# Patient Record
Sex: Female | Born: 1995 | Race: Black or African American | Hispanic: No | Marital: Single | Smoking: Never smoker
Health system: Southern US, Community
[De-identification: ages and names within clinical notes are randomized; demographics above are authoritative.]

## PROBLEM LIST (undated history)

## (undated) DIAGNOSIS — J45909 Unspecified asthma, uncomplicated: Secondary | ICD-10-CM

## (undated) DIAGNOSIS — C8190 Hodgkin lymphoma, unspecified, unspecified site: Secondary | ICD-10-CM

## (undated) DIAGNOSIS — R590 Localized enlarged lymph nodes: Secondary | ICD-10-CM

## (undated) DIAGNOSIS — D649 Anemia, unspecified: Secondary | ICD-10-CM

## (undated) HISTORY — DX: Hodgkin lymphoma, unspecified, unspecified site: C81.90

## (undated) HISTORY — PX: WISDOM TOOTH EXTRACTION: SHX21

---
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
## 2018-10-05 ENCOUNTER — Ambulatory Visit (INDEPENDENT_AMBULATORY_CARE_PROVIDER_SITE_OTHER)
Admission: EM | Admit: 2018-10-05 | Discharge: 2018-10-05 | Disposition: A | Discharge status: Home / Self Care | Payer: 59 | Source: Home / Self Care

## 2018-10-05 ENCOUNTER — Inpatient Hospital Stay (HOSPITAL_COMMUNITY)
Admission: AD | Admit: 2018-10-05 | Discharge: 2018-10-05 | Disposition: A | Discharge status: Home / Self Care | Payer: 59 | Attending: Obstetrics & Gynecology | Admitting: Obstetrics & Gynecology

## 2018-10-05 ENCOUNTER — Other Ambulatory Visit: Payer: Self-pay

## 2018-10-05 ENCOUNTER — Encounter (HOSPITAL_COMMUNITY): Payer: Self-pay

## 2018-10-05 ENCOUNTER — Inpatient Hospital Stay (HOSPITAL_COMMUNITY): Payer: 59

## 2018-10-05 DIAGNOSIS — O26891 Other specified pregnancy related conditions, first trimester: Secondary | ICD-10-CM

## 2018-10-05 DIAGNOSIS — D649 Anemia, unspecified: Secondary | ICD-10-CM | POA: Diagnosis not present

## 2018-10-05 DIAGNOSIS — O99011 Anemia complicating pregnancy, first trimester: Secondary | ICD-10-CM | POA: Diagnosis not present

## 2018-10-05 DIAGNOSIS — Z3A01 Less than 8 weeks gestation of pregnancy: Secondary | ICD-10-CM | POA: Insufficient documentation

## 2018-10-05 DIAGNOSIS — R8281 Pyuria: Secondary | ICD-10-CM

## 2018-10-05 DIAGNOSIS — J45909 Unspecified asthma, uncomplicated: Secondary | ICD-10-CM | POA: Diagnosis not present

## 2018-10-05 DIAGNOSIS — O99511 Diseases of the respiratory system complicating pregnancy, first trimester: Secondary | ICD-10-CM | POA: Diagnosis not present

## 2018-10-05 DIAGNOSIS — O209 Hemorrhage in early pregnancy, unspecified: Secondary | ICD-10-CM | POA: Insufficient documentation

## 2018-10-05 DIAGNOSIS — Z3401 Encounter for supervision of normal first pregnancy, first trimester: Secondary | ICD-10-CM | POA: Insufficient documentation

## 2018-10-05 DIAGNOSIS — N939 Abnormal uterine and vaginal bleeding, unspecified: Secondary | ICD-10-CM

## 2018-10-05 DIAGNOSIS — R109 Unspecified abdominal pain: Secondary | ICD-10-CM | POA: Insufficient documentation

## 2018-10-05 DIAGNOSIS — O26899 Other specified pregnancy related conditions, unspecified trimester: Secondary | ICD-10-CM

## 2018-10-05 LAB — WET PREP, GENITAL
Sperm: NONE SEEN
Trich, Wet Prep: NONE SEEN
Yeast Wet Prep HPF POC: NONE SEEN

## 2018-10-05 LAB — POCT URINALYSIS DIP (MANUAL ENTRY)
Bilirubin, UA: NEGATIVE
Glucose, UA: NEGATIVE mg/dL
Ketones, POC UA: NEGATIVE mg/dL
Nitrite, UA: NEGATIVE
Protein Ur, POC: NEGATIVE mg/dL
Spec Grav, UA: >=1.03 — AB (ref 1.010–1.025)
Urobilinogen, UA: 0.2 E.U./dL
pH, UA: 5.5 (ref 5.0–8.0)

## 2018-10-05 LAB — CBC
HCT: 29.3 % — ABNORMAL LOW (ref 36.0–46.0)
Hemoglobin: 8.1 g/dL — ABNORMAL LOW (ref 12.0–15.0)
MCH: 17.2 pg — ABNORMAL LOW (ref 26.0–34.0)
MCHC: 27.6 g/dL — ABNORMAL LOW (ref 30.0–36.0)
MCV: 62.2 fL — ABNORMAL LOW (ref 80.0–100.0)
Platelets: 387 10*3/uL (ref 150–400)
RBC: 4.71 MIL/uL (ref 3.87–5.11)
RDW: 19.3 % — ABNORMAL HIGH (ref 11.5–15.5)
WBC: 5.2 10*3/uL (ref 4.0–10.5)
nRBC: 0 % (ref 0.0–0.2)

## 2018-10-05 LAB — ABO/RH: ABO/RH(D): A POS

## 2018-10-05 LAB — POCT URINE PREGNANCY: Preg Test, Ur: POSITIVE — AB

## 2018-10-05 LAB — HCG, QUANTITATIVE, PREGNANCY: hCG, Beta Chain, Quant, S: 13226 m[IU]/mL — ABNORMAL HIGH (ref <5)

## 2018-10-05 MED ORDER — PRENATAL VITAMIN 27-0.8 MG PO TABS: 1.0000 | ORAL_TABLET | Freq: Every day | ORAL | 3 refills | Status: DC

## 2018-10-05 MED ORDER — POLYSACCHARIDE IRON COMPLEX 150 MG PO CAPS: 150.0000 mg | ORAL_CAPSULE | Freq: Two times a day (BID) | ORAL | 1 refills | Status: DC

## 2018-10-05 MED ORDER — CEPHALEXIN 500 MG PO CAPS: 500.0000 mg | ORAL_CAPSULE | Freq: Three times a day (TID) | ORAL | 0 refills | Status: DC

## 2018-10-05 NOTE — MAU Provider Note (Signed)
History     CSN: [id number]  Arrival date and time: 10/05/18 1426   First Provider Initiated Contact with Patient 10/05/18 1457      Chief Complaint  Patient presents with  . Abdominal Pain  . Vaginal Bleeding   G1 @[redacted]w[redacted]d  presenting with LAP. Pain started 3 days ago. Describes as intermittent and sharp. Pain is worse in LLQ. Pain improves with sitting, worsens with standing. Rates 5/10. Has not taken anything for it. Reports 2 days of vaginal spotting. Denies urinary sx. No fevers. No GI sx.   OB History    Gravida  1   Para      Term      Preterm      AB      Living        SAB      TAB      Ectopic      Multiple      Live Births              Past Medical History:  Diagnosis Date  . Asthma     No past surgical history on file.  No family history on file.  Social History   Tobacco Use  . Smoking status: Never Smoker  . Smokeless tobacco: Never Used  Substance Use Topics  . Alcohol use: No  . Drug use: No    Allergies:  Allergies  Allergen Reactions  . Fruit & Vegetable Daily [Nutritional Supplements] Swelling  . Peanut-Containing Drug Products Swelling    Medications Prior to Admission  Medication Sig Dispense Refill Last Dose  . cephALEXin (KEFLEX) 500 MG capsule Take 1 capsule (500 mg total) by mouth 3 (three) times daily. 20 capsule 0   . ibuprofen (ADVIL,MOTRIN) 600 MG tablet Take 1 tablet (600 mg total) by mouth every 6 (six) hours as needed. 30 tablet 0   . Prenatal Vit-Fe Fumarate-FA (PRENATAL VITAMIN) 27-0.8 MG TABS Take 1 tablet by mouth daily. 90 tablet 3     Review of Systems  Constitutional: Negative for chills and fever.  Gastrointestinal: Positive for abdominal pain. Negative for constipation, diarrhea, nausea and vomiting.  Genitourinary: Positive for vaginal bleeding. Negative for dysuria, frequency, urgency and vaginal discharge.   Physical Exam   Blood pressure 114/75, pulse 86, temperature 98.9 F (37.2 C),  temperature source Oral, resp. rate 16, height 5\' 3"  (1.6 m), weight 45.8 kg, last menstrual period 08/20/2018, SpO2 100 %.  Physical Exam  Nursing note and vitals reviewed. Constitutional: She is oriented to person, place, and time. She appears well-developed and well-nourished. No distress.  HENT:  Head: Normocephalic and atraumatic.  Neck: Normal range of motion.  Cardiovascular: Normal rate.  Respiratory: Effort normal. No respiratory distress.  GI: Soft. She exhibits no distension and no mass. There is no abdominal tenderness. There is no rebound and no guarding.  Genitourinary:    Genitourinary Comments: deferred   Musculoskeletal: Normal range of motion.  Neurological: She is alert and oriented to person, place, and time.  Skin: Skin is warm and dry.  Psychiatric: She has a normal mood and affect.   Results for orders placed or performed during the hospital encounter of 10/05/18 (from the past 24 hour(s))  ABO/Rh     Status: None   Collection Time: 10/05/18  2:50 PM  Result Value Ref Range   ABO/RH(D) A POS    No rh immune globuloin      NOT A RH IMMUNE GLOBULIN CANDIDATE, PT RH POSITIVE Performed  at [facility], [place] [address], [place], [place] [address]   CBC     Status: Abnormal   Collection Time: 10/05/18  2:50 PM  Result Value Ref Range   WBC 5.2 4.0 - 10.5 K/uL   RBC 4.71 3.87 - 5.11 MIL/uL   Hemoglobin 8.1 (L) 12.0 - 15.0 g/dL   HCT 29.3 (L) 36.0 - 46.0 %   MCV 62.2 (L) 80.0 - 100.0 fL   MCH 17.2 (L) 26.0 - 34.0 pg   MCHC 27.6 (L) 30.0 - 36.0 g/dL   RDW 19.3 (H) 11.5 - 15.5 %   Platelets 387 150 - 400 K/uL   nRBC 0.0 0.0 - 0.2 %  hCG, quantitative, pregnancy     Status: Abnormal   Collection Time: 10/05/18  2:50 PM  Result Value Ref Range   hCG, Beta Chain, Quant, S 13,226 (H) <5 mIU/mL  Wet prep, genital     Status: Abnormal   Collection Time: 10/05/18  3:07 PM  Result Value Ref Range   Yeast Wet Prep HPF POC NONE SEEN NONE SEEN   Trich, Wet Prep  NONE SEEN NONE SEEN   Clue Cells Wet Prep HPF POC PRESENT (A) NONE SEEN   WBC, Wet Prep HPF POC FEW (A) NONE SEEN   Sperm NONE SEEN    US Ob Less Than 14 Weeks With Ob Transvaginal  Result Date: 10/05/2018 CLINICAL DATA:  Pelvic pain EXAM: OBSTETRIC <14 WK [place] AND TRANSVAGINAL OB US TECHNIQUE: Both transabdominal and transvaginal ultrasound examinations were performed for complete evaluation of the gestation as well as the maternal uterus, adnexal regions, and pelvic cul-de-sac. Transvaginal technique was performed to assess early pregnancy. COMPARISON:  None. FINDINGS: Intrauterine gestational sac: Visualized Yolk sac:  Visualized Embryo:  Visualized Cardiac Activity: Visualized Heart Rate: 171 bpm CRL:  2 mm   5 w   5 d                  [place] EDC: June 02, 2019 Subchorionic hemorrhage:  None visualized. Maternal uterus/adnexae: Cervical os is closed. Left ovary measures 2.2 x 1.6 x 2.0 cm. Right ovary measures 3.2 x 2.3 x 2.4 cm. Small corpus luteum noted on the right, a physiologic finding. No free pelvic fluid. IMPRESSION: Single live intrauterine gestation with estimated gestational age of 6-weeks. Study otherwise unremarkable. Electronically Signed   By: Lowella Grip III M.D.   On: 10/05/2018 15:38   MAU Course  Procedures Orders Placed This Encounter  Procedures  . Wet prep, genital    Standing Status:   Standing    Number of Occurrences:   1    Order Specific Question:   Patient immune status    Answer:   Normal  . US OB LESS THAN 14 WEEKS WITH OB TRANSVAGINAL    Standing Status:   Standing    Number of Occurrences:   1    Order Specific Question:   Symptom/Reason for Exam    Answer:   Abdominal pain affecting pregnancy [[id number]]  . CBC    ABO ordered    Standing Status:   Standing    Number of Occurrences:   1  . hCG, quantitative, pregnancy    Standing Status:   Standing    Number of Occurrences:   1  . ABO/Rh    Standing Status:   Standing    Number of Occurrences:   1   . Discharge patient    Order Specific Question:  Discharge disposition    Answer:   01-Home or Self Care [1]    Order Specific Question:   Discharge patient date    Answer:   10/05/2018   MDM Labs and [place] ordered and reviewed. Viable IUP on [place]. Will treat anemia. Admits to ice PICA, encouraged cessation. Stable for discharge home.   Assessment and Plan   1. [redacted] weeks gestation of pregnancy   2. Abdominal pain affecting pregnancy   3. Anemia during pregnancy in first trimester    Discharge home Follow up with OB provider of choice to start care Rx Ferrex SAB precautions  Allergies as of 10/05/2018      Reactions   Fruit & Vegetable Daily [nutritional Supplements] Swelling   Peanut-containing Drug Products Swelling      Medication List    STOP taking these medications   cephALEXin 500 MG capsule Commonly known as:  KEFLEX   ibuprofen 600 MG tablet Commonly known as:  ADVIL,MOTRIN     TAKE these medications   iron polysaccharides 150 MG capsule Commonly known as:  NIFEREX Take 1 capsule (150 mg total) by mouth 2 (two) times daily.   Prenatal Vitamin 27-0.8 MG Tabs Take 1 tablet by mouth daily.      Julianne Handler, CNM 10/05/2018, 4:11 PM

## 2018-10-05 NOTE — Discharge Instructions (Signed)
Abdominal Pain During Pregnancy  Belly (abdominal) pain is common during pregnancy. There are many possible causes. Most of the time, it is not a serious problem. Other times, it can be a sign that something is wrong with the pregnancy. Always tell your doctor if you have belly pain. Follow these instructions at home:  Do not have sex or put anything in your vagina until your pain goes away completely.  Get plenty of rest until your pain gets better.  Drink enough fluid to keep your pee (urine) pale yellow.  Take over-the-counter and prescription medicines only as told by your doctor.  Keep all follow-up visits as told by your doctor. This is important. Contact a doctor if:  Your pain continues or gets worse after resting.  You have lower belly pain that: ? Comes and goes at regular times. ? Spreads to your back. ? Feels like menstrual cramps.  You have pain or burning when you pee (urinate). Get help right away if:  You have a fever or chills.  You have vaginal bleeding.  You are leaking fluid from your vagina.  You are passing tissue from your vagina.  You throw up (vomit) for more than 24 hours.  You have watery poop (diarrhea) for more than 24 hours.  Your baby is moving less than usual.  You feel very weak or faint.  You have shortness of breath.  You have very bad pain in your upper belly. Summary  Belly (abdominal) pain is common during pregnancy. There are many possible causes.  If you have belly pain during pregnancy, tell your doctor right away.  Keep all follow-up visits as told by your doctor. This is important. This information is not intended to replace advice given to you by your health care provider. Make sure you discuss any questions you have with your health care provider. Document Released: 06/05/2009 Document Revised: 09/19/2016 Document Reviewed: 09/19/2016 Elsevier Interactive Patient Education  2019 [place].   Pregnancy and  Anemia  Anemia is a condition in which the concentration of red blood cells, or hemoglobin, in the blood is below normal. Hemoglobin is a substance in red blood cells that carries oxygen to the tissues of the body. Anemia results when enough oxygen does not reach these tissues. Anemia is common during pregnancy because the woman's body needs more blood volume and blood cells to provide nutrition to the fetus. The fetus needs iron and folic acid as it is developing. Your body may not produce enough red blood cells because of this. Also, during pregnancy, the liquid part of the blood (plasma) increases by about 30-50%, and the red blood cells increase by only 20%. This lowers the concentration of the red blood cells and creates a natural anemia-like situation. What are the causes? The most common cause of anemia during pregnancy is not having enough iron in the body to make red blood cells (iron deficiency anemia). Other causes may include:  Folic acid deficiency.  Vitamin B12 deficiency.  Certain prescription or over-the-counter medicines.  Certain medical conditions or infections that destroy red blood cells.  A low platelet count and bleeding caused by antibodies that go through the placenta to the fetus from the mothers blood. What are the signs or symptoms? Mild anemia may not be noticeable. If it becomes severe, symptoms may include:  Feeling tired (fatigue).  Shortness of breath, especially during activity.  Weakness.  Fainting.  Pale looking skin.  Headaches.  A fast or irregular heartbeat (palpitations).  Dizziness. How is this diagnosed? This condition may be diagnosed based on:  Your medical history and a physical exam.  Blood tests. How is this treated? Treatment for anemia during pregnancy depends on the cause of the anemia. Treatment can include:  Dietary changes.  Supplements of iron, vitamin T65, or folic acid.  A blood transfusion. This may be needed if  anemia is severe.  Hospitalization. This may be needed if there is a lot of blood loss or severe anemia. Follow these instructions at home:  Follow recommendations from your dietitian or health care provider about changing your diet.  Increase your vitamin C intake. This will help the stomach absorb more iron. Some foods that are high in vitamin C include: ? Oranges. ? Peppers. ? Tomatoes. ? Mangoes.  Eat a diet rich in iron. This would include foods such as: ? Liver. ? Beef. ? Eggs. ? Whole grains. ? Spinach. ? Dried fruit.  Take iron and vitamins as told by your health care provider.  Eat green leafy vegetables. These are a good source of folic acid.  Keep all follow-up visits as told by your health care provider. This is important. Contact a health care provider if:  You have frequent or lasting headaches.  You look pale.  You bruise easily. Get help right away if:  You have extreme weakness, shortness of breath, or chest pain.  You become dizzy or have trouble concentrating.  You have heavy vaginal bleeding.  You develop a rash.  You have bloody or black, tarry stools.  You faint.  You vomit up blood.  You vomit repeatedly.  You have abdominal pain.  You have a fever.  You are dehydrated. Summary  Anemia is a condition in which the concentration of red blood cells or hemoglobin in the blood is below normal.  Anemia is common during pregnancy because the woman's body needs more blood volume and blood cells to provide nutrition to the fetus.  The most common cause of anemia during pregnancy is not having enough iron in the body to make red blood cells (iron deficiency anemia).  Mild anemia may not be noticeable. If it becomes severe, symptoms may include feeling tired and weak. This information is not intended to replace advice given to you by your health care provider. Make sure you discuss any questions you have with your health care  provider. Document Released: 06/14/2000 Document Revised: 07/23/2016 Document Reviewed: 07/23/2016 Elsevier Interactive Patient Education  2019 [place] for [employer] at [facility]       Phone: [phone]  [place] for [employer] at [place]   Phone: [place] for [employer] at [place]  Phone: [place] for [place] at [facility]  Phone: [place] for [place] at [place]  Phone: [place] for [place] at [facility]   Phone: [facility]       Phone: [phone]  [facility]    Phone: [phone]   [facility]    Phone: [phone]  [facility]    Phone: [place]    Phone: [phone]  [place] [facility]       Phone: [phone]   [place] [facility]  Phone: [place]    Phone: [phone]  [facility]   Phone:  [phone]  [facility]      Phone: [phone]  [facility]    Phone: [phone]

## 2018-10-05 NOTE — ED Triage Notes (Signed)
Pt states had a positive home pregnancy test a week ago. States had LLQ pain with spotting when wiping of dark blood. States today having pain to upper abdomen as well.

## 2018-10-05 NOTE — Discharge Instructions (Addendum)
Your urine shows that you have signs of an infection in the bladder.  For this we are prescribing an antibiotic.  You will need a follow up urine check in 10 days  The urine pregnancy test is still normal so it appears that you have a viable pregnancy.  Because of the bleeding and the sharp pains, we cannot rule out an ectopic pregnancy.  This means that you need to have an ultrasound today to make sure that the pregnancy is in the right place in the uterus.  You need to go to [facility], obstetrical wing, to have that ultrasound done today.

## 2018-10-05 NOTE — MAU Note (Signed)
Pt was at urgent care earlier today and was sent here to r/o ectopic. Has been having some abdominal pain, more on the left side. Having urinary frequency. Sees some bleeding when she wipes. Pain is 5/10.

## 2018-10-05 NOTE — ED Provider Notes (Signed)
[facility]    CSN: [id number] Arrival date & time: 10/05/18  1317     History   Chief Complaint Chief Complaint  Patient presents with  . Abdominal Pain    HPI Leah Gould. Leah Gould is a 23 y.o. female.   23 yo woman whose LMP was 2/20 presents with lower abdominal pain x 3 days and spotting.  She took a home pregnancy test a week ago.  Initially, the pain was LLQ but it is now more right sided.  The bleeding was described as dark red.  No fever.     Past Medical History:  Diagnosis Date  . Asthma     There are no active problems to display for this patient.   History reviewed. No pertinent surgical history.  OB History    Gravida  1   Para      Term      Preterm      AB      Living        SAB      TAB      Ectopic      Multiple      Live Births               Home Medications    Prior to Admission medications   Medication Sig Start Date End Date Taking? Authorizing Provider  cephALEXin (KEFLEX) 500 MG capsule Take 1 capsule (500 mg total) by mouth 3 (three) times daily. 10/05/18   [name], MD  ibuprofen (ADVIL,MOTRIN) 600 MG tablet Take 1 tablet (600 mg total) by mouth every 6 (six) hours as needed. 11/23/15   Kirichenko, [name], PA-C  Prenatal Vit-Fe Fumarate-FA (PRENATAL VITAMIN) 27-0.8 MG TABS Take 1 tablet by mouth daily. 10/05/18   [name], MD    Family History No family history on file.  Social History Social History   Tobacco Use  . Smoking status: Never Smoker  . Smokeless tobacco: Never Used  Substance Use Topics  . Alcohol use: No  . Drug use: No     Allergies   Fruit & vegetable daily [nutritional supplements] and Peanut-containing drug products   Review of Systems Review of Systems  Gastrointestinal: Positive for abdominal pain.  Genitourinary: Positive for pelvic pain and vaginal bleeding.  Neurological: Negative for light-headedness.  All other systems reviewed and are negative.    Physical Exam Triage Vital Signs ED Triage Vitals [10/05/18 1326]  Enc Vitals Group     BP 132/65     Pulse Rate 88     Resp 18     Temp 98.2 F (36.8 C)     Temp Source Oral     SpO2 100 %     Weight      Height      Head Circumference      Peak Flow      Pain Score 0     Pain Loc      Pain Edu?      Excl. in [place]?    No data found.  Updated Vital Signs BP 132/65 (BP Location: Left Arm)   Pulse 88   Temp 98.2 F (36.8 C) (Oral)   Resp 18   LMP 08/20/2018   SpO2 100%    Physical Exam Vitals signs and nursing note reviewed.  Constitutional:      Appearance: She is well-developed.  HENT:     Mouth/Throat:     Mouth: Mucous membranes are moist.  Eyes:  Extraocular Movements: Extraocular movements intact.  Cardiovascular:     Rate and Rhythm: Normal rate and regular rhythm.  Pulmonary:     Effort: Pulmonary effort is normal.  Abdominal:     General: Abdomen is flat. Bowel sounds are normal. There is no distension.     Palpations: Abdomen is soft.     Tenderness: There is no abdominal tenderness. There is no guarding or rebound.  Skin:    General: Skin is warm and dry.  Neurological:     General: No focal deficit present.     Mental Status: She is alert and oriented to person, place, and time.     Cranial Nerves: No cranial nerve deficit.     Motor: No weakness.  Psychiatric:        Mood and Affect: Mood normal.        Behavior: Behavior normal.      UC Treatments / Results  Labs (all labs ordered are listed, but only abnormal results are displayed) Labs Reviewed  POCT URINE PREGNANCY - Abnormal; Notable for the following components:      Result Value   Preg Test, Ur Positive (*)    All other components within normal limits  POCT URINALYSIS DIP (MANUAL ENTRY) - Abnormal; Notable for the following components:   Spec Grav, UA >=1.030 (*)    Blood, UA large (*)    Leukocytes, UA Small (1+) (*)    All other components within normal limits   URINE CULTURE    EKG None  Radiology No results found.  Procedures Procedures (including critical care time)  Medications Ordered in UC Medications - No data to display  Initial Impression / Assessment and Plan / UC Course  I have reviewed the triage vital signs and the nursing notes.  Pertinent labs & imaging results that were available during my care of the patient were reviewed by me and considered in my medical decision making (see chart for details).    Final Clinical Impressions(s) / UC Diagnoses   Final diagnoses:  Pyuria  Encounter for supervision of normal first pregnancy in first trimester  Vaginal bleeding     Discharge Instructions     Your urine shows that you have signs of an infection in the bladder.  For this we are prescribing an antibiotic.  You will need a follow up urine check in 10 days  The urine pregnancy test is still normal so it appears that you have a viable pregnancy.  Because of the bleeding and the sharp pains, we cannot rule out an ectopic pregnancy.  This means that you need to have an ultrasound today to make sure that the pregnancy is in the right place in the uterus.  You need to go to [facility], obstetrical wing, to have that ultrasound done today.    ED Prescriptions    Medication Sig Dispense Auth. Provider   Prenatal Vit-Fe Fumarate-FA (PRENATAL VITAMIN) 27-0.8 MG TABS Take 1 tablet by mouth daily. 90 tablet [name], MD   cephALEXin (KEFLEX) 500 MG capsule Take 1 capsule (500 mg total) by mouth 3 (three) times daily. 20 capsule [name], MD     Controlled Substance Prescriptions [place] Controlled Substance Registry consulted? Not Applicable   [name], MD 10/05/18 1345

## 2018-10-06 LAB — URINE CULTURE

## 2018-10-06 LAB — GC/CHLAMYDIA PROBE AMP (~~LOC~~) NOT AT ARMC
Chlamydia: POSITIVE — AB
Neisseria Gonorrhea: NEGATIVE

## 2018-10-07 ENCOUNTER — Inpatient Hospital Stay (HOSPITAL_COMMUNITY)
Admission: AD | Admit: 2018-10-07 | Discharge: 2018-10-07 | Disposition: A | Discharge status: Home / Self Care | Payer: 59 | Source: Home / Self Care | Attending: Obstetrics & Gynecology | Admitting: Obstetrics & Gynecology

## 2018-10-07 ENCOUNTER — Other Ambulatory Visit: Payer: Self-pay

## 2018-10-07 ENCOUNTER — Ambulatory Visit
Admission: EM | Admit: 2018-10-07 | Discharge: 2018-10-07 | Discharge status: Absent without leave | Payer: 59 | Source: Home / Self Care

## 2018-10-07 ENCOUNTER — Encounter (HOSPITAL_COMMUNITY): Payer: Self-pay

## 2018-10-07 ENCOUNTER — Encounter (HOSPITAL_COMMUNITY): Payer: Self-pay | Admitting: *Deleted

## 2018-10-07 ENCOUNTER — Inpatient Hospital Stay (HOSPITAL_COMMUNITY)
Admission: AD | Admit: 2018-10-07 | Discharge: 2018-10-07 | Disposition: A | Discharge status: Home / Self Care | Payer: 59 | Attending: Obstetrics & Gynecology | Admitting: Obstetrics & Gynecology

## 2018-10-07 DIAGNOSIS — O98311 Other infections with a predominantly sexual mode of transmission complicating pregnancy, first trimester: Secondary | ICD-10-CM | POA: Diagnosis not present

## 2018-10-07 DIAGNOSIS — Z91010 Allergy to peanuts: Secondary | ICD-10-CM

## 2018-10-07 DIAGNOSIS — O209 Hemorrhage in early pregnancy, unspecified: Secondary | ICD-10-CM | POA: Diagnosis not present

## 2018-10-07 DIAGNOSIS — O360110 Maternal care for anti-D [Rh] antibodies, first trimester, not applicable or unspecified: Secondary | ICD-10-CM

## 2018-10-07 DIAGNOSIS — Z79899 Other long term (current) drug therapy: Secondary | ICD-10-CM | POA: Insufficient documentation

## 2018-10-07 DIAGNOSIS — J45909 Unspecified asthma, uncomplicated: Secondary | ICD-10-CM | POA: Insufficient documentation

## 2018-10-07 DIAGNOSIS — O9989 Other specified diseases and conditions complicating pregnancy, childbirth and the puerperium: Secondary | ICD-10-CM | POA: Insufficient documentation

## 2018-10-07 DIAGNOSIS — O98811 Other maternal infectious and parasitic diseases complicating pregnancy, first trimester: Secondary | ICD-10-CM | POA: Diagnosis not present

## 2018-10-07 DIAGNOSIS — R109 Unspecified abdominal pain: Secondary | ICD-10-CM

## 2018-10-07 DIAGNOSIS — Z91018 Allergy to other foods: Secondary | ICD-10-CM | POA: Insufficient documentation

## 2018-10-07 DIAGNOSIS — R1084 Generalized abdominal pain: Secondary | ICD-10-CM

## 2018-10-07 DIAGNOSIS — O99511 Diseases of the respiratory system complicating pregnancy, first trimester: Secondary | ICD-10-CM

## 2018-10-07 DIAGNOSIS — Z3A01 Less than 8 weeks gestation of pregnancy: Secondary | ICD-10-CM | POA: Insufficient documentation

## 2018-10-07 DIAGNOSIS — O208 Other hemorrhage in early pregnancy: Secondary | ICD-10-CM

## 2018-10-07 DIAGNOSIS — A749 Chlamydial infection, unspecified: Secondary | ICD-10-CM | POA: Insufficient documentation

## 2018-10-07 DIAGNOSIS — Z6790 Unspecified blood type, Rh positive: Secondary | ICD-10-CM | POA: Insufficient documentation

## 2018-10-07 DIAGNOSIS — R197 Diarrhea, unspecified: Secondary | ICD-10-CM

## 2018-10-07 LAB — URINALYSIS, ROUTINE W REFLEX MICROSCOPIC
Bilirubin Urine: NEGATIVE
Glucose, UA: NEGATIVE mg/dL
Ketones, ur: NEGATIVE mg/dL
Nitrite: NEGATIVE
Protein, ur: NEGATIVE mg/dL
Specific Gravity, Urine: 1.021 (ref 1.005–1.030)
WBC, UA: >50 WBC/hpf — ABNORMAL HIGH (ref 0–5)
pH: 5 (ref 5.0–8.0)

## 2018-10-07 LAB — CBC
HCT: 29.2 % — ABNORMAL LOW (ref 36.0–46.0)
Hemoglobin: 8.1 g/dL — ABNORMAL LOW (ref 12.0–15.0)
MCH: 17.4 pg — ABNORMAL LOW (ref 26.0–34.0)
MCHC: 27.7 g/dL — ABNORMAL LOW (ref 30.0–36.0)
MCV: 62.7 fL — ABNORMAL LOW (ref 80.0–100.0)
Platelets: 341 10*3/uL (ref 150–400)
RBC: 4.66 MIL/uL (ref 3.87–5.11)
RDW: 19.6 % — ABNORMAL HIGH (ref 11.5–15.5)
WBC: 6.6 10*3/uL (ref 4.0–10.5)
nRBC: 0 % (ref 0.0–0.2)

## 2018-10-07 LAB — HCG, QUANTITATIVE, PREGNANCY: hCG, Beta Chain, Quant, S: 18817 m[IU]/mL — ABNORMAL HIGH (ref <5)

## 2018-10-07 MED ORDER — HYOSCYAMINE SULFATE 0.125 MG SL SUBL
0.1250 mg | SUBLINGUAL_TABLET | Freq: Once | SUBLINGUAL | Status: AC
  Administered 2018-10-07: 0.125 mg via SUBLINGUAL
  Filled 2018-10-07: qty 1

## 2018-10-07 MED ORDER — AZITHROMYCIN 250 MG PO TABS
1000.0000 mg | ORAL_TABLET | Freq: Once | ORAL | Status: AC
  Administered 2018-10-07: 1000 mg via ORAL
  Filled 2018-10-07: qty 4

## 2018-10-07 NOTE — MAU Note (Addendum)
Pt was here earlier today for bleeding. Arrived EMS at 2010. Pt said she was going to get tylenol and started cramping really bad and had someone take her home and call EMS. Did not take the tylenol. Rates pain 10/10. Has bleeding when she wipes.  States she's had diarrhea since she's left here today after the cramps started.

## 2018-10-07 NOTE — Discharge Instructions (Signed)
Chlamydia, Female Chlamydia is an STD (sexually transmitted disease). It is a bacterial infection that spreads (is contagious) through sexual contact. Chlamydia can occur in different areas of the body, including:  The tube that moves urine from the bladder out of the body (urethra).  The lower part of the uterus (cervix).  The throat.  The rectum. This condition is not difficult to treat. However, if left untreated, chlamydia can lead to more serious health problems, including pelvic inflammatory disorder (PID). PID can increase your risk of not being able to have children (sterility). Also, if chlamydia is left untreated and you are pregnant or become pregnant, there is a chance that your baby can become infected during delivery. This may cause serious health problems for the baby. What are the causes? Chlamydia is caused by the bacteria Chlamydia trachomatis. It is passed from an infected partner during sexual activity. Chlamydia can spread through contact with the genitals, mouth, or rectum. What are the signs or symptoms? In some cases, there may not be any symptoms for this condition (asymptomatic), especially early in the infection. If symptoms develop, they may include:  Burning with urination.  Frequent urination.  Vaginal discharge.  Redness, soreness, and swelling (inflammation) of the rectum.  Bleeding or discharge from the rectum.  Abdominal pain.  Pain during sexual intercourse.  Bleeding between menstrual periods.  Itching, burning, or redness in the eyes, or discharge from the eyes. How is this diagnosed? This condition may be diagnosed with:  Urine tests.  Swab tests. Depending on your symptoms, your health care provider may use a cotton swab to collect discharge from your vagina or rectum to test for the bacteria.  A pelvic exam. How is this treated? This condition is treated with antibiotic medicines. If you are pregnant, certain types of antibiotics will  need to be avoided. Follow these instructions at home: Medicines  Take over-the-counter and prescription medicines only as told by your health care provider.  Take your antibiotic medicine as told by your health care provider. Do not stop taking the antibiotic even if you start to feel better. Sexual activity  Tell sexual partners about your infection. This includes any oral, anal, or vaginal sex partners you have had within 60 days of when your symptoms started. Sexual partners should also be treated, even if they have no signs of the disease.  Do not have sex until you and your sexual partners have completed treatment and your health care provider says it is okay. If your health care provider prescribed you a single dose treatment, wait 7 days after taking the treatment before having sex. General instructions  It is your responsibility to get your test results. Ask your health care provider, or the department performing the test, when your results will be ready.  Get plenty of rest.  Eat a healthy, well-balanced diet.  Drink enough fluids to keep your urine clear or pale yellow.  Keep all follow-up visits as told by your health care provider. This is important. You may need to be tested for infection again 3 months after treatment. How is this prevented? The only sure way to prevent chlamydia is to avoid having sex. However, you can lower your risk by:  Using latex condoms correctly every time you have sex.  Not having multiple sexual partners.  Asking if your sexual partner has been tested for STIs and had negative results. Contact a health care provider if:  You develop new symptoms or your symptoms do not get  better after completing treatment.  You have a fever or chills.  You have pain during sexual intercourse. Get help right away if:  Your pain gets worse and does not get better with medicine.  You develop flu-like symptoms, such as night sweats, sore throat, or  muscle aches.  You experience nausea or vomiting.  You have difficulty swallowing.  You have bleeding between periods or after sex.  You have irregular menstrual periods.  You have abdominal or lower back pain that does not get better with medicine.  You feel weak or dizzy, or you faint.  You are pregnant and you develop symptoms of chlamydia. Summary  Chlamydia is an STD (sexually transmitted disease). It is a bacterial infection that spreads (is contagious) through sexual contact.  This condition is not difficult to treat, however. If left untreated, chlamydia can lead to more serious health problems, including pelvic inflammatory disease (PID).  In some cases, there may not be any symptoms for this condition (asymptomatic).  This condition is treated with antibiotic medicines.  Using latex condoms correctly every time you have sex can help prevent chlamydia. This information is not intended to replace advice given to you by your health care provider. Make sure you discuss any questions you have with your health care provider. Document Released: 03/27/2005 Document Revised: 12/09/2017 Document Reviewed: 06/03/2016 Elsevier Interactive Patient Education  2019 [place].  Vaginal Bleeding During Pregnancy, First Trimester  A small amount of bleeding from the vagina (spotting) is relatively common during early pregnancy. It usually stops on its own. Various things may cause bleeding or spotting during early pregnancy. Some bleeding may be related to the pregnancy, and some may not. In many cases, the bleeding is normal and is not a problem. However, bleeding can also be a sign of something serious. Be sure to tell your health care provider about any vaginal bleeding right away. Some possible causes of vaginal bleeding during the first trimester include:  Infection or inflammation of the cervix.  Growths (polyps) on the cervix.  Miscarriage or threatened  miscarriage.  Pregnancy tissue developing outside of the uterus (ectopic pregnancy).  A mass of tissue developing in the uterus due to an egg being fertilized incorrectly (molar pregnancy). Follow these instructions at home: Activity  Follow instructions from your health care provider about limiting your activity. Ask what activities are safe for you.  If needed, make plans for someone to help with your regular activities.  Do not have sex or orgasms until your health care provider says that this is safe. General instructions  Take over-the-counter and prescription medicines only as told by your health care provider.  Pay attention to any changes in your symptoms.  Do not use tampons or douche.  Write down how many pads you use each day, how often you change pads, and how soaked (saturated) they are.  If you pass any tissue from your vagina, save the tissue so you can show it to your health care provider.  Keep all follow-up visits as told by your health care provider. This is important. Contact a health care provider if:  You have vaginal bleeding during any part of your pregnancy.  You have cramps or labor pains.  You have a fever. Get help right away if:  You have severe cramps in your back or abdomen.  You pass large clots or a large amount of tissue from your vagina.  Your bleeding increases.  You feel light-headed or weak, or you faint.  You have chills.  You are leaking fluid or have a gush of fluid from your vagina. Summary  A small amount of bleeding (spotting) from the vagina is relatively common during early pregnancy.  Various things may cause bleeding or spotting in early pregnancy.  Be sure to tell your health care provider about any vaginal bleeding right away. This information is not intended to replace advice given to you by your health care provider. Make sure you discuss any questions you have with your health care provider. Document Released:  03/27/2005 Document Revised: 09/19/2016 Document Reviewed: 09/19/2016 Elsevier Interactive Patient Education  2019 [employer].

## 2018-10-07 NOTE — MAU Provider Note (Signed)
Chief Complaint: Abdominal Pain   First Provider Initiated Contact with Patient 10/07/18 2049        SUBJECTIVE HPI: Leah Gould is a 23 y.o. G1P0 at [redacted]w[redacted]d by LMP who presents to maternity admissions reporting severe abdominal cramping associated with diarrhea.  Was seen here earlier today for early pregnancy bleeding.  Was treated with Zithromax and developed diarrhea today. Her boyfriend offered to get her some Tylenol but she stated it hurt too bad and called 911.  Bleeding minimal.. She denies vaginal itching/burning, urinary symptoms, h/a, dizziness, n/v, or fever/chills.   Abdominal Pain  This is a new problem. The current episode started today. The onset quality is sudden. The problem occurs constantly. The problem has been unchanged. The pain is located in the generalized abdominal region. The quality of the pain is colicky and cramping. The abdominal pain does not radiate. Associated symptoms include diarrhea. Pertinent negatives include no constipation, dysuria, fever, frequency, melena, myalgias, nausea or vomiting. Nothing aggravates the pain. The pain is relieved by nothing. She has tried nothing for the symptoms.   RN Note: Pt was here earlier today for bleeding. Arrived EMS at 2010. Pt said she was going to get tylenol and started cramping really bad and had someone take her home and call EMS. Did not take the tylenol. Rates pain 10/10. Has bleeding when she wipes.  States she's had diarrhea since she's left here today after the cramps started  Past Medical History:  Diagnosis Date  . Asthma    No past surgical history on file. Social History   Socioeconomic History  . Marital status: Single    Spouse name: Not on file  . Number of children: Not on file  . Years of education: Not on file  . Highest education level: Not on file  Occupational History  . Not on file  Social Needs  . Financial resource strain: Not on file  . Food insecurity:    Worry: Not on file     Inability: Not on file  . Transportation needs:    Medical: Not on file    Non-medical: Not on file  Tobacco Use  . Smoking status: Never Smoker  . Smokeless tobacco: Never Used  Substance and Sexual Activity  . Alcohol use: No  . Drug use: No  . Sexual activity: Yes    Birth control/protection: None  Lifestyle  . Physical activity:    Days per week: Not on file    Minutes per session: Not on file  . Stress: Not on file  Relationships  . Social connections:    Talks on phone: Not on file    Gets together: Not on file    Attends religious service: Not on file    Active member of club or organization: Not on file    Attends meetings of clubs or organizations: Not on file    Relationship status: Not on file  . Intimate partner violence:    Fear of current or ex partner: Not on file    Emotionally abused: Not on file    Physically abused: Not on file    Forced sexual activity: Not on file  Other Topics Concern  . Not on file  Social History Narrative  . Not on file   No current facility-administered medications on file prior to encounter.    Current Outpatient Medications on File Prior to Encounter  Medication Sig Dispense Refill  . cephALEXin (KEFLEX) 500 MG capsule Take 500 mg by  mouth 3 (three) times daily.    . iron polysaccharides (NIFEREX) 150 MG capsule Take 1 capsule (150 mg total) by mouth 2 (two) times daily. 60 capsule 1  . Prenatal Vit-Fe Fumarate-FA (PRENATAL VITAMIN) 27-0.8 MG TABS Take 1 tablet by mouth daily. 90 tablet 3   Allergies  Allergen Reactions  . Fruit & Vegetable Daily [Nutritional Supplements] Swelling  . Peanut-Containing Drug Products Swelling    I have reviewed patient's Past Medical Hx, Surgical Hx, Family Hx, Social Hx, medications and allergies.   ROS:  Review of Systems  Constitutional: Negative for fever.  Gastrointestinal: Positive for abdominal pain and diarrhea. Negative for constipation, melena, nausea and vomiting.   Genitourinary: Negative for dysuria and frequency.  Musculoskeletal: Negative for myalgias.   Review of Systems  Other systems negative   Physical Exam  Physical Exam Patient Vitals for the past 24 hrs:  BP Temp Temp src Pulse Resp SpO2  10/07/18 2018 (!) 102/58 98.7 F (37.1 C) Oral (!) 103 18 100 %   Constitutional: Well-developed, well-nourished female in no acute distress.  Cardiovascular: normal rate Respiratory: normal effort GI: Abd soft, diffusely tender, but no guarding or rebound.  . Pos BS x 4 MS: Extremities nontender, no edema, normal ROM Neurologic: Alert and oriented x 4.  GU: Neg CVAT.  PELVIC EXAM: deferred  LAB RESULTS Results for orders placed or performed during the hospital encounter of 10/07/18 (from the past 24 hour(s))  Urinalysis, Routine w reflex microscopic     Status: Abnormal   Collection Time: 10/07/18 11:49 AM  Result Value Ref Range   Color, Urine YELLOW YELLOW   APPearance HAZY (A) CLEAR   Specific Gravity, Urine 1.021 1.005 - 1.030   pH 5.0 5.0 - 8.0   Glucose, UA NEGATIVE NEGATIVE mg/dL   Hgb urine dipstick LARGE (A) NEGATIVE   Bilirubin Urine NEGATIVE NEGATIVE   Ketones, ur NEGATIVE NEGATIVE mg/dL   Protein, ur NEGATIVE NEGATIVE mg/dL   Nitrite NEGATIVE NEGATIVE   Leukocytes,Ua MODERATE (A) NEGATIVE   RBC / HPF 21-50 0 - 5 RBC/hpf   WBC, UA >50 (H) 0 - 5 WBC/hpf   Bacteria, UA RARE (A) NONE SEEN   Squamous Epithelial / LPF 0-5 0 - 5   Mucus PRESENT   CBC     Status: Abnormal   Collection Time: 10/07/18 12:29 PM  Result Value Ref Range   WBC 6.6 4.0 - 10.5 K/uL   RBC 4.66 3.87 - 5.11 MIL/uL   Hemoglobin 8.1 (L) 12.0 - 15.0 g/dL   HCT 29.2 (L) 36.0 - 46.0 %   MCV 62.7 (L) 80.0 - 100.0 fL   MCH 17.4 (L) 26.0 - 34.0 pg   MCHC 27.7 (L) 30.0 - 36.0 g/dL   RDW 19.6 (H) 11.5 - 15.5 %   Platelets 341 150 - 400 K/uL   nRBC 0.0 0.0 - 0.2 %  hCG, quantitative, pregnancy     Status: Abnormal   Collection Time: 10/07/18 12:29 PM   Result Value Ref Range   hCG, Beta Chain, Quant, S 18,817 (H) <5 mIU/mL    --/--/A POS (04/06 1450)  IMAGING US Ob Less Than 14 Weeks With Ob Transvaginal  Result Date: 10/05/2018 CLINICAL DATA:  Pelvic pain EXAM: OBSTETRIC <14 WK [place] AND TRANSVAGINAL OB US TECHNIQUE: Both transabdominal and transvaginal ultrasound examinations were performed for complete evaluation of the gestation as well as the maternal uterus, adnexal regions, and pelvic cul-de-sac. Transvaginal technique was performed to assess early pregnancy.  COMPARISON:  None. FINDINGS: Intrauterine gestational sac: Visualized Yolk sac:  Visualized Embryo:  Visualized Cardiac Activity: Visualized Heart Rate: 171 bpm CRL:  2 mm   5 w   5 d                  [place] EDC: June 02, 2019 Subchorionic hemorrhage:  None visualized. Maternal uterus/adnexae: Cervical os is closed. Left ovary measures 2.2 x 1.6 x 2.0 cm. Right ovary measures 3.2 x 2.3 x 2.4 cm. Small corpus luteum noted on the right, a physiologic finding. No free pelvic fluid. IMPRESSION: Single live intrauterine gestation with estimated gestational age of 6-weeks. Study otherwise unremarkable. Electronically Signed   By: Lowella Grip III M.D.   On: 10/05/2018 15:38    MAU Management/MDM: Ordered Levsin SL for colicky intestinal pain.  I suspect this is the source of her pain, given sudden onset and colicky nature of pain.   Got almost complete relief of pain after receiving Levsin.  Has had no diarrhea stools while here.  ASSESSMENT Pregnancy at [redacted]w[redacted]d Colicky abdominal pain, likely due to diarrhea (possibly side effect of antibiotic or GI virus) Good relief from anticholinergic  PLAN Discharge home Supportive care Advance diet as tolerated PO hydration Follow up with prenatal care as before  Pt stable at time of discharge. Encouraged to return here or to other Urgent Care/ED if she develops worsening of symptoms, increase in pain, fever, or other concerning symptoms.     Hansel Feinstein CNM, MSN Certified Nurse-Midwife 10/07/2018  8:49 PM

## 2018-10-07 NOTE — MAU Note (Signed)
Pt presents to MAU with complaints of vaginal bleeding like a period. Denies any pain. Was evaluated 2 days ago in MAU and had a bladder infection

## 2018-10-07 NOTE — Discharge Instructions (Signed)
Diarrhea, Adult  Diarrhea is frequent loose and watery bowel movements. Diarrhea can make you feel weak and cause you to become dehydrated. Dehydration can make you tired and thirsty, cause you to have a dry mouth, and decrease how often you urinate.  Diarrhea typically lasts 2-3 days. However, it can last longer if it is a sign of something more serious. It is important to treat your diarrhea as told by your health care provider.  Follow these instructions at home:  Eating and drinking         Follow these recommendations as told by your health care provider:  · Take an oral rehydration solution (ORS). This is an over-the-counter medicine that helps return your body to its normal balance of nutrients and water. It is found at pharmacies and retail stores.  · Drink plenty of fluids, such as water, ice chips, diluted fruit juice, and low-calorie sports drinks. You can drink milk also, if desired.  · Avoid drinking fluids that contain a lot of sugar or caffeine, such as energy drinks, sports drinks, and soda.  · Eat bland, easy-to-digest foods in small amounts as you are able. These foods include bananas, applesauce, rice, lean meats, toast, and crackers.  · Avoid alcohol.  · Avoid spicy or fatty foods.    Medicines  · Take over-the-counter and prescription medicines only as told by your health care provider.  · If you were prescribed an antibiotic medicine, take it as told by your health care provider. Do not stop using the antibiotic even if you start to feel better.  General instructions    · Wash your hands often using soap and water. If soap and water are not available, use a hand sanitizer. Others in the household should wash their hands as well. Hands should be washed:  ? After using the toilet or changing a diaper.  ? Before preparing, cooking, or serving food.  ? While caring for a sick person or while visiting someone in a hospital.  · Drink enough fluid to keep your urine pale yellow.  · Rest at home while  you recover.  · Watch your condition for any changes.  · Take a warm bath to relieve any burning or pain from frequent diarrhea episodes.  · Keep all follow-up visits as told by your health care provider. This is important.  Contact a health care provider if:  · You have a fever.  · Your diarrhea gets worse.  · You have new symptoms.  · You cannot keep fluids down.  · You feel light-headed or dizzy.  · You have a headache.  · You have muscle cramps.  Get help right away if:  · You have chest pain.  · You feel extremely weak or you faint.  · You have bloody or black stools or stools that look like tar.  · You have severe pain, cramping, or bloating in your abdomen.  · You have trouble breathing or you are breathing very quickly.  · Your heart is beating very quickly.  · Your skin feels cold and clammy.  · You feel confused.  · You have signs of dehydration, such as:  ? Dark urine, very little urine, or no urine.  ? Cracked lips.  ? Dry mouth.  ? Sunken eyes.  ? Sleepiness.  ? Weakness.  Summary  · Diarrhea is frequent loose and watery bowel movements. Diarrhea can make you feel weak and cause you to become dehydrated.  · Drink enough fluids   to keep your urine pale yellow.  · Make sure that you wash your hands after using the toilet. If soap and water are not available, use hand sanitizer.  · Contact a health care provider if your diarrhea gets worse or you have new symptoms.  · Get help right away if you have signs of dehydration.  This information is not intended to replace advice given to you by your health care provider. Make sure you discuss any questions you have with your health care provider.  Document Released: 06/07/2002 Document Revised: 11/21/2017 Document Reviewed: 11/21/2017  Elsevier Interactive Patient Education © 2019 [employer]

## 2018-10-07 NOTE — MAU Provider Note (Signed)
History     CSN: [id number]  Arrival date and time: 10/07/18 1118   First Provider Initiated Contact with Patient 10/07/18 1207      Chief Complaint  Patient presents with  . Vaginal Bleeding   [name] is a 23 y.o. G1P0 at [redacted]w[redacted]d who presents to MAU for vaginal bleeding which began this morning around 1000. Pt reports she is having bleeding when wiping without cramping. Pt was seen in MAU 10/05/2018 for bleeding and cramping and was found to have a single IUP @[redacted]w[redacted]d . Pt reports the bleeding is more today than on the 6th, so she wanted to be evaluated for a miscarriage because "she doesn't want it to be one." Pt reports she only sees blood with urination, but reports she believes the bleeding is coming from her vagina.  Of note, pt had H/H of 8.1/29.3 and was started on oral iron at MAU visit on 10/05/2018, but pt reports she has not yet started.  WetPrep and GC/CT done in MAU 10/05/2018, pt is +for CT, reports has not received a phone call/tx yet. Will treat today in MAU. Pt reports only 1 partner in the last 60days. WetPrep from 10/05/2018 +ClueCells, +WBCs. Pt denies vaginal discharge/odor/itching.  Passing blood clots? "one tiny clot" Blood soaking clothes? no Lightheaded/dizzy? no Significant pelvic pain or cramping? no Passed any tissue? no  Current pregnancy problems? unknown Blood Type? A Positive Allergies? peanuts, some fruits and vegetables Current medications? PNVs Current [facility] & next appt? [facility], end of May  Pt denies N/V, abdominal pain, constipation, diarrhea, or urinary problems. Pt denies fever, chills, fatigue, sweating or changes in appetite. Pt denies SOB or chest pain. Pt denies dizziness, HA, light-headedness, weakness.   OB History    Gravida  1   Para      Term      Preterm      AB      Living        SAB      TAB      Ectopic      Multiple      Live Births              Past Medical History:  Diagnosis Date   . Asthma     History reviewed. No pertinent surgical history.  History reviewed. No pertinent family history.  Social History   Tobacco Use  . Smoking status: Never Smoker  . Smokeless tobacco: Never Used  Substance Use Topics  . Alcohol use: No  . Drug use: No    Allergies:  Allergies  Allergen Reactions  . Fruit & Vegetable Daily [Nutritional Supplements] Swelling  . Peanut-Containing Drug Products Swelling    Medications Prior to Admission  Medication Sig Dispense Refill Last Dose  . cephALEXin (KEFLEX) 500 MG capsule Take 500 mg by mouth 3 (three) times daily.   10/06/2018 at Unknown time  . iron polysaccharides (NIFEREX) 150 MG capsule Take 1 capsule (150 mg total) by mouth 2 (two) times daily. 60 capsule 1 Past Month at Unknown time  . Prenatal Vit-Fe Fumarate-FA (PRENATAL VITAMIN) 27-0.8 MG TABS Take 1 tablet by mouth daily. 90 tablet 3 Past Month at Unknown time    Review of Systems  Constitutional: Negative for appetite change, chills, diaphoresis, fatigue and fever.  Respiratory: Negative for shortness of breath.   Cardiovascular: Negative for chest pain.  Gastrointestinal: Negative for abdominal pain, constipation, diarrhea, nausea and vomiting.  Genitourinary: Positive for vaginal bleeding. Negative for  dysuria, flank pain, frequency, pelvic pain, urgency and vaginal discharge.  Neurological: Negative for dizziness, weakness, light-headedness and headaches.   Physical Exam   Blood pressure 121/71, pulse 97, temperature 98.4 F (36.9 C), resp. rate 16, weight 46.3 kg, last menstrual period 08/20/2018, SpO2 100 %.   Patient Vitals for the past 24 hrs:  BP Temp Pulse Resp SpO2 Weight  10/07/18 1154 - - - - - 46.3 kg  10/07/18 1152 - - - - 100 % -  10/07/18 1151 121/71 98.4 F (36.9 C) 97 16 - -    Physical Exam  Constitutional: She is oriented to person, place, and time. She appears well-developed and well-nourished. No distress.  HENT:  Head:  Normocephalic and atraumatic.  Respiratory: Effort normal.  GI: Soft. She exhibits no distension and no mass. There is no abdominal tenderness. There is no rebound and no guarding.  Genitourinary: There is no rash, tenderness or lesion on the right labia. There is no rash, tenderness or lesion on the left labia. Uterus is not tender. Cervix exhibits no motion tenderness, no discharge and no friability. Right adnexum displays no mass, no tenderness and no fullness. Left adnexum displays no mass, no tenderness and no fullness.    Vaginal bleeding present.     No vaginal discharge or tenderness.  There is bleeding in the vagina. No tenderness in the vagina.    Genitourinary Comments: Cervix long and closed.   Neurological: She is alert and oriented to person, place, and time.  Skin: Skin is warm and dry. She is not diaphoretic.  Psychiatric: She has a normal mood and affect. Her behavior is normal.   Results for orders placed or performed during the hospital encounter of 10/07/18 (from the past 24 hour(s))  Urinalysis, Routine w reflex microscopic     Status: Abnormal   Collection Time: 10/07/18 11:49 AM  Result Value Ref Range   Color, Urine YELLOW YELLOW   APPearance HAZY (A) CLEAR   Specific Gravity, Urine 1.021 1.005 - 1.030   pH 5.0 5.0 - 8.0   Glucose, UA NEGATIVE NEGATIVE mg/dL   Hgb urine dipstick LARGE (A) NEGATIVE   Bilirubin Urine NEGATIVE NEGATIVE   Ketones, ur NEGATIVE NEGATIVE mg/dL   Protein, ur NEGATIVE NEGATIVE mg/dL   Nitrite NEGATIVE NEGATIVE   Leukocytes,Ua MODERATE (A) NEGATIVE   RBC / HPF 21-50 0 - 5 RBC/hpf   WBC, UA >50 (H) 0 - 5 WBC/hpf   Bacteria, UA RARE (A) NONE SEEN   Squamous Epithelial / LPF 0-5 0 - 5   Mucus PRESENT   CBC     Status: Abnormal   Collection Time: 10/07/18 12:29 PM  Result Value Ref Range   WBC 6.6 4.0 - 10.5 K/uL   RBC 4.66 3.87 - 5.11 MIL/uL   Hemoglobin 8.1 (L) 12.0 - 15.0 g/dL   HCT 29.2 (L) 36.0 - 46.0 %   MCV 62.7 (L) 80.0 - 100.0  fL   MCH 17.4 (L) 26.0 - 34.0 pg   MCHC 27.7 (L) 30.0 - 36.0 g/dL   RDW 19.6 (H) 11.5 - 15.5 %   Platelets 341 150 - 400 K/uL   nRBC 0.0 0.0 - 0.2 %  hCG, quantitative, pregnancy     Status: Abnormal   Collection Time: 10/07/18 12:29 PM  Result Value Ref Range   hCG, Beta Chain, Quant, S 18,817 (H) <5 mIU/mL    US Ob Less Than 14 Weeks With Ob Transvaginal  Result Date: 10/05/2018 CLINICAL  DATA:  Pelvic pain EXAM: OBSTETRIC <14 WK [place] AND TRANSVAGINAL OB US TECHNIQUE: Both transabdominal and transvaginal ultrasound examinations were performed for complete evaluation of the gestation as well as the maternal uterus, adnexal regions, and pelvic cul-de-sac. Transvaginal technique was performed to assess early pregnancy. COMPARISON:  None. FINDINGS: Intrauterine gestational sac: Visualized Yolk sac:  Visualized Embryo:  Visualized Cardiac Activity: Visualized Heart Rate: 171 bpm CRL:  2 mm   5 w   5 d                  [place] EDC: June 02, 2019 Subchorionic hemorrhage:  None visualized. Maternal uterus/adnexae: Cervical os is closed. Left ovary measures 2.2 x 1.6 x 2.0 cm. Right ovary measures 3.2 x 2.3 x 2.4 cm. Small corpus luteum noted on the right, a physiologic finding. No free pelvic fluid. IMPRESSION: Single live intrauterine gestation with estimated gestational age of 6-weeks. Study otherwise unremarkable. Electronically Signed   By: Lowella Grip III M.D.   On: 10/05/2018 15:38    MAU Course  Procedures  MDM -vaginal bleeding in early pregnancy, pt has established IUP on [place] f rom 10/05/2018 -CE: long/closed/posterior -+CT from swabs 10/05/2018, treated with azithromycin in MAU, expedited partner therapy RX given -UA: hazy/large hgb/moderate leukocytes/>50WBCs/rare bacteria -urine culture ordered -CBC: no change in H/H -hCG: 90,240 (was 13,226 on 10/05/2018) -pt discharged to home in stable condition  Assessment and Plan   1. Vaginal bleeding affecting early pregnancy   2. Blood  type, Rh positive   3. [redacted] weeks gestation of pregnancy   4. Positive Chlamydia PCR    Allergies as of 10/07/2018      Reactions   Fruit & Vegetable Daily [nutritional Supplements] Swelling   Peanut-containing Drug Products Swelling      Medication List    TAKE these medications   cephALEXin 500 MG capsule Commonly known as:  KEFLEX Take 500 mg by mouth 3 (three) times daily.   iron polysaccharides 150 MG capsule Commonly known as:  NIFEREX Take 1 capsule (150 mg total) by mouth 2 (two) times daily.   Prenatal Vitamin 27-0.8 MG Tabs Take 1 tablet by mouth daily.       -pt strongly encouraged to start PO iron today -expedited partner therapy RX given to pt for boyfriend -pt advised to have all partners in the past 60days tested and treated for CT -pt advised to wait 7days after both her and partner have been treated for CT -pt advised to call [facility] and see if appt can be moved sooner for NOB visit (currently scheduled for end of May) -pt discharged to home in stable condition  Gerrie Nordmann Tarren Sabree 10/07/2018, 2:08 PM

## 2018-10-08 LAB — CULTURE, OB URINE: Culture: NO GROWTH

## 2018-10-17 ENCOUNTER — Other Ambulatory Visit: Payer: Self-pay

## 2018-10-17 ENCOUNTER — Telehealth: Payer: Self-pay

## 2018-10-17 ENCOUNTER — Inpatient Hospital Stay (HOSPITAL_COMMUNITY): Payer: 59

## 2018-10-17 ENCOUNTER — Encounter (HOSPITAL_COMMUNITY): Payer: Self-pay | Admitting: *Deleted

## 2018-10-17 ENCOUNTER — Inpatient Hospital Stay (HOSPITAL_COMMUNITY)
Admission: AD | Admit: 2018-10-17 | Discharge: 2018-10-17 | Disposition: A | Discharge status: Home / Self Care | Payer: 59 | Attending: Obstetrics and Gynecology | Admitting: Obstetrics and Gynecology

## 2018-10-17 DIAGNOSIS — O039 Complete or unspecified spontaneous abortion without complication: Secondary | ICD-10-CM | POA: Insufficient documentation

## 2018-10-17 DIAGNOSIS — O26891 Other specified pregnancy related conditions, first trimester: Secondary | ICD-10-CM | POA: Diagnosis not present

## 2018-10-17 DIAGNOSIS — O98812 Other maternal infectious and parasitic diseases complicating pregnancy, second trimester: Secondary | ICD-10-CM | POA: Diagnosis not present

## 2018-10-17 DIAGNOSIS — N939 Abnormal uterine and vaginal bleeding, unspecified: Secondary | ICD-10-CM

## 2018-10-17 DIAGNOSIS — R599 Enlarged lymph nodes, unspecified: Secondary | ICD-10-CM | POA: Diagnosis not present

## 2018-10-17 DIAGNOSIS — Z3A01 Less than 8 weeks gestation of pregnancy: Secondary | ICD-10-CM

## 2018-10-17 DIAGNOSIS — N760 Acute vaginitis: Secondary | ICD-10-CM | POA: Diagnosis not present

## 2018-10-17 DIAGNOSIS — B9689 Other specified bacterial agents as the cause of diseases classified elsewhere: Secondary | ICD-10-CM | POA: Diagnosis not present

## 2018-10-17 DIAGNOSIS — O209 Hemorrhage in early pregnancy, unspecified: Secondary | ICD-10-CM | POA: Diagnosis present

## 2018-10-17 DIAGNOSIS — R1030 Lower abdominal pain, unspecified: Secondary | ICD-10-CM

## 2018-10-17 LAB — URINALYSIS, ROUTINE W REFLEX MICROSCOPIC
Bilirubin Urine: NEGATIVE
Glucose, UA: NEGATIVE mg/dL
Hgb urine dipstick: NEGATIVE
Ketones, ur: NEGATIVE mg/dL
Leukocytes,Ua: NEGATIVE
Nitrite: NEGATIVE
Protein, ur: NEGATIVE mg/dL
Specific Gravity, Urine: 1.018 (ref 1.005–1.030)
pH: 5 (ref 5.0–8.0)

## 2018-10-17 LAB — WET PREP, GENITAL
Sperm: NONE SEEN
Trich, Wet Prep: NONE SEEN
Yeast Wet Prep HPF POC: NONE SEEN

## 2018-10-17 LAB — HCG, QUANTITATIVE, PREGNANCY: hCG, Beta Chain, Quant, S: 375 m[IU]/mL — ABNORMAL HIGH (ref <5)

## 2018-10-17 MED ORDER — METRONIDAZOLE 500 MG PO TABS: 500.0000 mg | ORAL_TABLET | Freq: Two times a day (BID) | ORAL | 0 refills | Status: DC

## 2018-10-17 MED ORDER — ACETAMINOPHEN 500 MG PO TABS
1000.0000 mg | ORAL_TABLET | Freq: Once | ORAL | Status: AC
  Administered 2018-10-17: 1000 mg via ORAL
  Filled 2018-10-17: qty 2

## 2018-10-17 NOTE — Telephone Encounter (Signed)
Leah Gould [id number]   Patient called and verified her identity via birth date and last 58 of her SSN.  Patient agreeable to results via phone and was informed of current hCG level of 375.  Discussed follow up at [facility] in ~ 2 weeks on May 1st.  Patient agreeable and scheduled for said date at 0830.  Patient requests verification of level, but otherwise has no further questions or concerns.  Patient expresses gratitude for provider assistance.  Time spent on non-face-to-face interaction was >61minutes.  Maryann Conners MSN, CNM 10/17/2018 7:45 PM

## 2018-10-17 NOTE — Discharge Instructions (Signed)
Bacterial Vaginosis  Miscarriage A miscarriage is the loss of an unborn baby (fetus) before the 20th week of pregnancy. Most miscarriages happen during the first 3 months of pregnancy. Sometimes, a miscarriage can happen before a woman knows that she is pregnant. Having a miscarriage can be an emotional experience. If you have had a miscarriage, talk with your health care provider about any questions you may have about miscarrying, the grieving process, and your plans for future pregnancy. What are the causes? A miscarriage may be caused by:  Problems with the genes or chromosomes of the fetus. These problems make it impossible for the baby to develop normally. They are often the result of random errors that occur early in the development of the baby, and are not passed from parent to child (not inherited).  Infection of the cervix or uterus.  Conditions that affect hormone balance in the body.  Problems with the cervix, such as the cervix opening and thinning before pregnancy is at term (cervical insufficiency).  Problems with the uterus. These may include: ? A uterus with an abnormal shape. ? Fibroids in the uterus. ? Congenital abnormalities. These are problems that were present at birth.  Certain medical conditions.  Smoking, drinking alcohol, or using drugs.  Injury (trauma). In many cases, the cause of a miscarriage is not known. What are the signs or symptoms? Symptoms of this condition include:  Vaginal bleeding or spotting, with or without cramps or pain.  Pain or cramping in the abdomen or lower back.  Passing fluid, tissue, or blood clots from the vagina. How is this diagnosed? This condition may be diagnosed based on:  A physical exam.  Ultrasound.  Blood tests.  Urine tests. How is this treated? Treatment for a miscarriage is sometimes not necessary if you naturally pass all the tissue that was in your uterus. If necessary, this condition may be treated  with:  Dilation and curettage (D&C). This is a procedure in which the cervix is stretched open and the lining of the uterus (endometrium) is scraped. This is done only if tissue from the fetus or placenta remains in the body (incomplete miscarriage).  Medicines, such as: ? Antibiotic medicine, to treat infection. ? Medicine to help the body pass any remaining tissue. ? Medicine to reduce (contract) the size of the uterus. These medicines may be given if you have a lot of bleeding. If you have Rh negative blood and your baby was Rh positive, you will need a shot of a medicine called Rh immunoglobulinto protect your future babies from Rh blood problems. "Rh-negative" and "Rh-positive" refer to whether or not the blood has a specific protein found on the surface of red blood cells (Rh factor). Follow these instructions at home: Medicines   Take over-the-counter and prescription medicines only as told by your health care provider.  If you were prescribed antibiotic medicine, take it as told by your health care provider. Do not stop taking the antibiotic even if you start to feel better.  Do not take NSAIDs, such as aspirin and ibuprofen, unless they are approved by your health care provider. These medicines can cause bleeding. Activity  Rest as directed. Ask your health care provider what activities are safe for you.  Have someone help with home and family responsibilities during this time. General instructions  Keep track of the number of sanitary pads you use each day and how soaked (saturated) they are. Write down this information.  Monitor the amount of tissue or  blood clots that you pass from your vagina. Save any large amounts of tissue for your health care provider to examine.  Do not use tampons, douche, or have sex until your health care provider approves.  To help you and your partner with the process of grieving, talk with your health care provider or seek counseling.  When  you are ready, meet with your health care provider to discuss any important steps you should take for your health. Also, discuss steps you should take to have a healthy pregnancy in the future.  Keep all follow-up visits as told by your health care provider. This is important. Where to find more information  The American Congress of Obstetricians and Gynecologists: www.acog.org  [place] Department of Health and Programmer, systems of Womens Health: [place] Contact a health care provider if:  You have a fever or chills.  You have a foul smelling vaginal discharge.  You have more bleeding instead of less. Get help right away if:  You have severe cramps or pain in your back or abdomen.  You pass blood clots or tissue from your vagina that is walnut-sized or larger.  You soak more than 1 regular sanitary pad in an hour.  You become light-headed or weak.  You pass out.  You have feelings of sadness that take over your thoughts, or you have thoughts of hurting yourself. Summary  Most miscarriages happen in the first 3 months of pregnancy. Sometimes miscarriage happens before a woman even knows that she is pregnant.  Follow your health care provider's instruction for home care. Keep all follow-up appointments.  To help you and your partner with the process of grieving, talk with your health care provider or seek counseling. This information is not intended to replace advice given to you by your health care provider. Make sure you discuss any questions you have with your health care provider. Document Released: 12/11/2000 Document Revised: 07/23/2016 Document Reviewed: 07/23/2016 Elsevier Interactive Patient Education  2019 [employer]  Bacterial vaginosis is a vaginal infection that occurs when the normal balance of bacteria in the vagina is disrupted. It results from an overgrowth of certain bacteria. This is the most common vaginal infection among women ages  45-44. Because bacterial vaginosis increases your risk for STIs (sexually transmitted infections), getting treated can help reduce your risk for chlamydia, gonorrhea, herpes, and HIV (human immunodeficiency virus). Treatment is also important for preventing complications in pregnant women, because this condition can cause an early (premature) delivery. What are the causes? This condition is caused by an increase in harmful bacteria that are normally present in small amounts in the vagina. However, the reason that the condition develops is not fully understood. What increases the risk? The following factors may make you more likely to develop this condition:  Having a new sexual partner or multiple sexual partners.  Having unprotected sex.  Douching.  Having an intrauterine device (IUD).  Smoking.  Drug and alcohol abuse.  Taking certain antibiotic medicines.  Being pregnant. You cannot get bacterial vaginosis from toilet seats, bedding, swimming pools, or contact with objects around you. What are the signs or symptoms? Symptoms of this condition include:  Grey or white vaginal discharge. The discharge can also be watery or foamy.  A fish-like odor with discharge, especially after sexual intercourse or during menstruation.  Itching in and around the vagina.  Burning or pain with urination. Some women with bacterial vaginosis have no signs or symptoms. How is this diagnosed? This condition  is diagnosed based on:  Your medical history.  A physical exam of the vagina.  Testing a sample of vaginal fluid under a microscope to look for a large amount of bad bacteria or abnormal cells. Your health care provider may use a cotton swab or a small wooden spatula to collect the sample. How is this treated? This condition is treated with antibiotics. These may be given as a pill, a vaginal cream, or a medicine that is put into the vagina (suppository). If the condition comes back after  treatment, a second round of antibiotics may be needed. Follow these instructions at home: Medicines  Take over-the-counter and prescription medicines only as told by your health care provider.  Take or use your antibiotic as told by your health care provider. Do not stop taking or using the antibiotic even if you start to feel better. General instructions  If you have a female sexual partner, tell her that you have a vaginal infection. She should see her health care provider and be treated if she has symptoms. If you have a female sexual partner, he does not need treatment.  During treatment: ? Avoid sexual activity until you finish treatment. ? Do not douche. ? Avoid alcohol as directed by your health care provider. ? Avoid breastfeeding as directed by your health care provider.  Drink enough water and fluids to keep your urine clear or pale yellow.  Keep the area around your vagina and rectum clean. ? Wash the area daily with warm water. ? Wipe yourself from front to back after using the toilet.  Keep all follow-up visits as told by your health care provider. This is important. How is this prevented?  Do not douche.  Wash the outside of your vagina with warm water only.  Use protection when having sex. This includes latex condoms and dental dams.  Limit how many sexual partners you have. To help prevent bacterial vaginosis, it is best to have sex with just one partner (monogamous).  Make sure you and your sexual partner are tested for STIs.  Wear cotton or cotton-lined underwear.  Avoid wearing tight pants and pantyhose, especially during summer.  Limit the amount of alcohol that you drink.  Do not use any products that contain nicotine or tobacco, such as cigarettes and e-cigarettes. If you need help quitting, ask your health care provider.  Do not use illegal drugs. Where to find more information  Centers for Disease Control and Prevention:  AppraiserFraud.fi  American Sexual Health Association (ASHA): www.ashastd.org  [place] Department of Health and Financial controller, Office on Women's Health: DustingSprays.pl or SecuritiesCard.it Contact a health care provider if:  Your symptoms do not improve, even after treatment.  You have more discharge or pain when urinating.  You have a fever.  You have pain in your abdomen.  You have pain during sex.  You have vaginal bleeding between periods. Summary  Bacterial vaginosis is a vaginal infection that occurs when the normal balance of bacteria in the vagina is disrupted.  Because bacterial vaginosis increases your risk for STIs (sexually transmitted infections), getting treated can help reduce your risk for chlamydia, gonorrhea, herpes, and HIV (human immunodeficiency virus). Treatment is also important for preventing complications in pregnant women, because the condition can cause an early (premature) delivery.  This condition is treated with antibiotic medicines. These may be given as a pill, a vaginal cream, or a medicine that is put into the vagina (suppository). This information is not intended to replace advice  given to you by your health care provider. Make sure you discuss any questions you have with your health care provider. Document Released: 06/17/2005 Document Revised: 10/21/2016 Document Reviewed: 03/02/2016 Elsevier Interactive Patient Education  2019 [place] A miscarriage is the loss of an unborn baby (fetus) before the 20th week of pregnancy. Most miscarriages happen during the first 3 months of pregnancy. Sometimes, a miscarriage can happen before a woman knows that she is pregnant. Having a miscarriage can be an emotional experience. If you have had a miscarriage, talk with your health care provider about any questions you may have about miscarrying, the grieving process, and your plans for future  pregnancy. What are the causes? A miscarriage may be caused by:  Problems with the genes or chromosomes of the fetus. These problems make it impossible for the baby to develop normally. They are often the result of random errors that occur early in the development of the baby, and are not passed from parent to child (not inherited).  Infection of the cervix or uterus.  Conditions that affect hormone balance in the body.  Problems with the cervix, such as the cervix opening and thinning before pregnancy is at term (cervical insufficiency).  Problems with the uterus. These may include: ? A uterus with an abnormal shape. ? Fibroids in the uterus. ? Congenital abnormalities. These are problems that were present at birth.  Certain medical conditions.  Smoking, drinking alcohol, or using drugs.  Injury (trauma). In many cases, the cause of a miscarriage is not known. What are the signs or symptoms? Symptoms of this condition include:  Vaginal bleeding or spotting, with or without cramps or pain.  Pain or cramping in the abdomen or lower back.  Passing fluid, tissue, or blood clots from the vagina. How is this diagnosed? This condition may be diagnosed based on:  A physical exam.  Ultrasound.  Blood tests.  Urine tests. How is this treated? Treatment for a miscarriage is sometimes not necessary if you naturally pass all the tissue that was in your uterus. If necessary, this condition may be treated with:  Dilation and curettage (D&C). This is a procedure in which the cervix is stretched open and the lining of the uterus (endometrium) is scraped. This is done only if tissue from the fetus or placenta remains in the body (incomplete miscarriage).  Medicines, such as: ? Antibiotic medicine, to treat infection. ? Medicine to help the body pass any remaining tissue. ? Medicine to reduce (contract) the size of the uterus. These medicines may be given if you have a lot of  bleeding. If you have Rh negative blood and your baby was Rh positive, you will need a shot of a medicine called Rh immunoglobulinto protect your future babies from Rh blood problems. "Rh-negative" and "Rh-positive" refer to whether or not the blood has a specific protein found on the surface of red blood cells (Rh factor). Follow these instructions at home: Medicines   Take over-the-counter and prescription medicines only as told by your health care provider.  If you were prescribed antibiotic medicine, take it as told by your health care provider. Do not stop taking the antibiotic even if you start to feel better.  Do not take NSAIDs, such as aspirin and ibuprofen, unless they are approved by your health care provider. These medicines can cause bleeding. Activity  Rest as directed. Ask your health care provider what activities are safe for you.  Have someone help with home  and family responsibilities during this time. General instructions  Keep track of the number of sanitary pads you use each day and how soaked (saturated) they are. Write down this information.  Monitor the amount of tissue or blood clots that you pass from your vagina. Save any large amounts of tissue for your health care provider to examine.  Do not use tampons, douche, or have sex until your health care provider approves.  To help you and your partner with the process of grieving, talk with your health care provider or seek counseling.  When you are ready, meet with your health care provider to discuss any important steps you should take for your health. Also, discuss steps you should take to have a healthy pregnancy in the future.  Keep all follow-up visits as told by your health care provider. This is important. Where to find more information  The American Congress of Obstetricians and Gynecologists: www.acog.org  [place]. Department of Health and Programmer, systems of Womens Health:  [place] Contact a health care provider if:  You have a fever or chills.  You have a foul smelling vaginal discharge.  You have more bleeding instead of less. Get help right away if:  You have severe cramps or pain in your back or abdomen.  You pass blood clots or tissue from your vagina that is walnut-sized or larger.  You soak more than 1 regular sanitary pad in an hour.  You become light-headed or weak.  You pass out.  You have feelings of sadness that take over your thoughts, or you have thoughts of hurting yourself. Summary  Most miscarriages happen in the first 3 months of pregnancy. Sometimes miscarriage happens before a woman even knows that she is pregnant.  Follow your health care provider's instruction for home care. Keep all follow-up appointments.  To help you and your partner with the process of grieving, talk with your health care provider or seek counseling. This information is not intended to replace advice given to you by your health care provider. Make sure you discuss any questions you have with your health care provider. Document Released: 12/11/2000 Document Revised: 07/23/2016 Document Reviewed: 07/23/2016 Elsevier Interactive Patient Education  2019 [place] with Pregnancy Loss Pregnancy loss can happen any time during a pregnancy. Often the cause is not known. It is rarely because of anything you did. Pregnancy loss in early pregnancy (during the first trimester) is called a miscarriage. This type of pregnancy loss is the most common. Pregnancy loss that happens after 20 weeks of pregnancy is called fetal demise if the baby's heart stops beating before birth. Fetal demise is much less common. Some women experience spontaneous labor shortly after fetal demise resulting in a stillborn birth (stillbirth). Any pregnancy loss can be devastating. You will need to recover both physically and emotionally. Most women are able to get  pregnant again after a pregnancy loss and deliver a healthy baby. How to manage emotional recovery  Pregnancy loss is very hard emotionally. You may feel many different emotions while you grieve. You may feel sad and angry. You may also feel guilty. It is normal to have periods of crying. Emotional recovery can take longer than physical recovery. It is different for everyone. Taking these steps can help you cope:  Remember that it is unlikely you did anything to cause the pregnancy loss.  Share your thoughts and feelings with friends, family, and your partner. Remember that your partner is also recovering emotionally.  Make sure you have a good support system, and do not spend too much time alone.  Meet with a pregnancy loss counselor or join a pregnancy loss support group.  Get enough sleep and eat a healthy diet. Return to regular exercise when you have recovered physically.  Do not use drugs or alcohol to manage your emotions.  Consider seeing a mental health professional to help you recover emotionally.  Ask a friend or loved one to help you decide what to do with any clothing and nursery items you received for your baby. In the case of a stillbirth, many women benefit from taking additional steps in the grieving process. You may want to:  Hold your baby after the birth.  Name your baby.  Request a birth certificate.  Create a keepsake such as handprints or footprints.  Dress your baby and have a picture taken.  Make funeral arrangements.  Ask for a baptism or blessing. Hospitals have staff members who can help you with all these arrangements. How to recognize emotional stress It is normal to have emotional stress after a pregnancy loss. But emotional stress that lasts a long time or becomes severe requires treatment. Watch out for these signs of severe emotional stress:  Sadness, anger, or guilt that is not going away and is interfering with your normal  activities.  Relationship problems that have occurred or gotten worse since the pregnancy loss.  Signs of depression that last longer than 2 weeks. These may include: ? Sadness. ? Anxiety. ? Hopelessness. ? Loss of interest in activities you enjoy. ? Inability to concentrate. ? Trouble sleeping or sleeping too much. ? Loss of appetite or overeating. ? Thoughts of death or of hurting yourself. Follow these instructions at home: Medicines  Take over-the-counter and prescription medicines only as told by your health care provider. Activity  Rest at home until your energy level returns. Return to your normal activities as told by your health care provider. Ask your health care provider what activities are safe for you. General instructions  Keep all follow-up visits as told by your health care provider. This is important.  It may be helpful to meet with others who have experienced pregnancy loss. Ask your health care provider about support groups and resources.  To help you and your partner with the process of grieving, talk with your health care provider or seek counseling.  When you are ready, meet with your health care provider to discuss steps to take for a future pregnancy. Where to find more information  U.S. Department of Health and Programmer, systems on Women's Health: [place]s.tn  American Pregnancy Association: www.americanpregnancy.org Contact a health care provider if:  You continue to experience grief, sadness, or lack of motivation for everyday activities, and those feelings do not improve over time.  You are struggling to recover emotionally, especially if you are using alcohol or substances to help. Get help right away if:  You have thoughts of hurting yourself or others. If you ever feel like you may hurt yourself or others, or have thoughts about taking your own life, get help right away. You can go to your nearest emergency department or  call:  Your local emergency services (911 in the [place]  A suicide crisis helpline, such as the [facility] at 501-135-6047. This is open 24 hours a day. Summary  Any pregnancy loss can be difficult physically and emotionally.  You may experience many different emotions while you grieve. Emotional recovery can  last longer than physical recovery.  It is normal to have emotional stress after a pregnancy loss. But emotional stress that lasts a long time or becomes severe requires treatment.  See your health care provider if you are struggling emotionally after a pregnancy loss. This information is not intended to replace advice given to you by your health care provider. Make sure you discuss any questions you have with your health care provider. Document Released: 08/28/2017 Document Revised: 08/28/2017 Document Reviewed: 08/28/2017 Elsevier Interactive Patient Education  2019 [employer].

## 2018-10-17 NOTE — [facility] Note (Signed)
Leah Gould is a 23 y.o. at [redacted]w[redacted]d here in [facility] reporting: having some lower left abdominal pain that is ongoing for several weeks. States the pain is now sharp. Still having some spotting. Last IC was yesterday. No abnormal discharge.  Onset of complaint: ongoing  Pain score: 6/10  Vitals:   10/17/18 1404  BP: 117/66  Pulse: 92  Resp: 16  Temp: 98.9 F (37.2 C)  SpO2: 100%      Lab orders placed from triage: [facility]

## 2018-10-17 NOTE — MAU Provider Note (Signed)
History     CSN: [id number]  Arrival date and time: 10/17/18 1353   First Provider Initiated Contact with Patient 10/17/18 1503      Chief Complaint  Patient presents with  . Abdominal Pain  . Vaginal Bleeding   Leah Gould is a 23 y.o. G1P0 at [redacted]w[redacted]d who presents for Abdominal Pain and Vaginal Bleeding.  She states she started last Wednesday and she had to wear a pad and was passing tissue. However, she was seen and evaluated as well as treated for CT.  She reports that she is now spotting, but is having abdominal pain that started 2 weeks ago and has worsened.  She reports it is a "sharp pain that feels like needles" and is mostly felt with standing.  She denies vaginal discharge, itching, odor, or burning.  She endorses that her partner completed his treatment for CT.  She reports that she took one tylenol last night without some improvement.  She reports sexual intercourse yesterday, but was too uncomfortable and had to stop.  She reports that the pain on lower left side "was worse" during sex. She reports having a large lymph node on that side that she has had for the past 10 years.  She states it has enlarged since last summer and has caused some "sharp shooting pains since the discovery of pregnancy."  She denies pain with palpation, but reports some discomfort with ambulation.      OB History    Gravida  1   Para      Term      Preterm      AB      Living        SAB      TAB      Ectopic      Multiple      Live Births              Past Medical History:  Diagnosis Date  . Asthma     History reviewed. No pertinent surgical history.  History reviewed. No pertinent family history.  Social History   Tobacco Use  . Smoking status: Never Smoker  . Smokeless tobacco: Never Used  Substance Use Topics  . Alcohol use: No  . Drug use: No    Allergies:  Allergies  Allergen Reactions  . Fruit & Vegetable Daily [Nutritional Supplements] Swelling  .  Peanut-Containing Drug Products Swelling    Medications Prior to Admission  Medication Sig Dispense Refill Last Dose  . cephALEXin (KEFLEX) 500 MG capsule Take 500 mg by mouth 3 (three) times daily.   10/16/2018 at Unknown time  . iron polysaccharides (NIFEREX) 150 MG capsule Take 1 capsule (150 mg total) by mouth 2 (two) times daily. 60 capsule 1 10/16/2018 at Unknown time  . Prenatal Vit-Fe Fumarate-FA (PRENATAL VITAMIN) 27-0.8 MG TABS Take 1 tablet by mouth daily. 90 tablet 3 10/16/2018 at Unknown time    Review of Systems  Constitutional: Negative for chills and fever.  Respiratory: Negative for cough and shortness of breath.   Gastrointestinal: Positive for abdominal pain ( Lower Left-Constant 6-7/10). Negative for constipation, diarrhea, nausea and vomiting.  Genitourinary: Positive for dyspareunia and vaginal bleeding. Negative for dysuria and vaginal discharge.  Musculoskeletal: Negative for back pain.  Neurological: Negative for dizziness, light-headedness and headaches.   Physical Exam   Blood pressure 117/66, pulse 92, temperature 98.9 F (37.2 C), temperature source Oral, resp. rate 16, height 5\' 3"  (1.6 m), weight 45.6 kg, last  menstrual period 08/20/2018, SpO2 100 %.  Physical Exam  Constitutional: She is oriented to person, place, and time. She appears well-developed and well-nourished. No distress.  HENT:  Head: Normocephalic and atraumatic.  Eyes: Conjunctivae are normal.  Neck: Normal range of motion.  Cardiovascular: Normal rate, regular rhythm and normal heart sounds.  Respiratory: Effort normal and breath sounds normal.  GI: Soft. There is no abdominal tenderness.  Musculoskeletal: Normal range of motion.  Lymphadenopathy:       Left: Inguinal adenopathy present.  Inguinal Lymphadenopathy: NT, Freely Movable, Soft  Neurological: She is alert and oriented to person, place, and time.  Skin: Skin is warm and dry.  Psychiatric: She has a normal mood and affect. Her  behavior is normal.    MAU Course  Procedures Results for orders placed or performed during the hospital encounter of 10/17/18 (from the past 24 hour(s))  Urinalysis, Routine w reflex microscopic     Status: None   Collection Time: 10/17/18  2:14 PM  Result Value Ref Range   Color, Urine YELLOW YELLOW   APPearance CLEAR CLEAR   Specific Gravity, Urine 1.018 1.005 - 1.030   pH 5.0 5.0 - 8.0   Glucose, UA NEGATIVE NEGATIVE mg/dL   Hgb urine dipstick NEGATIVE NEGATIVE   Bilirubin Urine NEGATIVE NEGATIVE   Ketones, ur NEGATIVE NEGATIVE mg/dL   Protein, ur NEGATIVE NEGATIVE mg/dL   Nitrite NEGATIVE NEGATIVE   Leukocytes,Ua NEGATIVE NEGATIVE  Wet prep, genital     Status: Abnormal   Collection Time: 10/17/18  3:47 PM  Result Value Ref Range   Yeast Wet Prep HPF POC NONE SEEN NONE SEEN   Trich, Wet Prep NONE SEEN NONE SEEN   Clue Cells Wet Prep HPF POC PRESENT (A) NONE SEEN   WBC, Wet Prep HPF POC FEW (A) NONE SEEN   Sperm NONE SEEN   hCG, quantitative, pregnancy     Status: Abnormal   Collection Time: 10/17/18  5:33 PM  Result Value Ref Range   hCG, Beta Chain, Quant, S 375 (H) <5 mIU/mL   US Ob Less Than 14 Weeks With Ob Transvaginal  Result Date: 10/17/2018 CLINICAL DATA:  Lower abdominal pain. Vaginal bleeding. Pregnant patient. EXAM: OBSTETRIC <14 WK [place] AND TRANSVAGINAL OB US TECHNIQUE: Both transabdominal and transvaginal ultrasound examinations were performed for complete evaluation of the gestation as well as the maternal uterus, adnexal regions, and pelvic cul-de-sac. Transvaginal technique was performed to assess early pregnancy. COMPARISON:  October 05, 2018 FINDINGS: Intrauterine gestational sac: None Maternal uterus/adnexae: Both ovaries are normal in appearance. No free fluid in the pelvis. Other: Incidentally, there is a superficial abnormal left inguinal lymph node measuring 5 x 2.8 x 3.6 cm with cortical thickening in restriction of the fatty hilum. IMPRESSION: 1. The  previously identified IUP on the October 05, 2018 comparison is no longer present consistent with interval miscarriage. 2. Abnormal lymph node in the left inguinal region with cortical thickening, enlargement, and restriction of the fatty hilum. This is nonspecific and could represent a reactive node, a metastatic node, or lymphoma. Recommend clinical correlation. If there is no known history to suggest a reactive node, recommend biopsy. If there is a history suggesting a possible reactive node, recommend short-term follow-up ultrasound in 2 or 3 weeks to ensure resolution. Electronically Signed   By: Dorise Bullion III M.D   On: 10/17/2018 17:01    MDM Pelvic Exam with Wet prep Labs: UA [place]  Assessment and Plan  23 year old  G1P0 at 7.3 weeks Lower Abdominal Pain  -Exam findings discussed. -Wet prep collected and sent. -Attempted and unable to identify fetus via BSUS. -Will send for official US -Offered and accepts tylenol 1g now -Will await results.    Follow Up (5:19 PM) Complete Miscarriage Abnormal Lymph Node  -Discussed US Findings. -Condolences given. -Questions when miscarriage could have occurred and informed that this is unknown. -Informed of radiologist recommendation for follow up regarding lymph node. -Encouraged to obtain a primary care provider for said follow up. -Questions and concerns addressed regarding lymph node. -Patient expresses desire for pregnancy and declines contraception. -Will collect hCG and call with results and plan for follow up. -Encouraged to call or return to MAU if symptoms worsen or with the onset of new symptoms. -Discharged to home in stable condition.  Maryann Conners MSN, CNM 10/17/2018, 3:04 PM

## 2018-10-30 ENCOUNTER — Other Ambulatory Visit: Payer: Self-pay | Admitting: Obstetrics and Gynecology

## 2018-10-30 ENCOUNTER — Other Ambulatory Visit: Payer: 59

## 2018-10-30 ENCOUNTER — Other Ambulatory Visit: Payer: Self-pay

## 2018-10-30 ENCOUNTER — Other Ambulatory Visit: Payer: Self-pay | Admitting: *Deleted

## 2018-10-30 DIAGNOSIS — O039 Complete or unspecified spontaneous abortion without complication: Secondary | ICD-10-CM

## 2018-10-31 LAB — BETA HCG QUANT (REF LAB): hCG Quant: 12 m[IU]/mL

## 2018-11-03 ENCOUNTER — Telehealth: Payer: Self-pay | Admitting: *Deleted

## 2018-11-03 DIAGNOSIS — O039 Complete or unspecified spontaneous abortion without complication: Secondary | ICD-10-CM

## 2018-11-03 NOTE — Telephone Encounter (Addendum)
-----   Message from Chancy Milroy, MD sent at 11/03/2018  1:26 PM EDT ----- Repeat BHCG in 2 weeks Thanks Legrand Como  1555  Called pt and informed her that lab result from 5/1 shows pregnancy hormone level has reduced greatly. This level needs to be followed down to the non-pregnant (normal) level and she will need repeat test on 5/15. Pt voiced understanding and agreed to appt on 5/15 @ 10:30. Lab order placed.

## 2018-11-12 ENCOUNTER — Other Ambulatory Visit (HOSPITAL_COMMUNITY): Payer: Self-pay | Admitting: Family Medicine

## 2018-11-12 DIAGNOSIS — R590 Localized enlarged lymph nodes: Secondary | ICD-10-CM

## 2018-11-13 ENCOUNTER — Other Ambulatory Visit: Payer: 59

## 2018-11-13 ENCOUNTER — Other Ambulatory Visit: Payer: Self-pay | Admitting: Obstetrics and Gynecology

## 2018-11-13 ENCOUNTER — Other Ambulatory Visit: Payer: Self-pay

## 2018-11-13 DIAGNOSIS — O039 Complete or unspecified spontaneous abortion without complication: Secondary | ICD-10-CM

## 2018-11-14 LAB — BETA HCG QUANT (REF LAB): hCG Quant: 2 m[IU]/mL

## 2018-11-17 ENCOUNTER — Other Ambulatory Visit: Payer: Self-pay

## 2018-11-17 ENCOUNTER — Ambulatory Visit (HOSPITAL_COMMUNITY)
Admission: RE | Admit: 2018-11-17 | Discharge: 2018-11-17 | Disposition: A | Discharge status: Home / Self Care | Payer: 59 | Source: Ambulatory Visit | Attending: Family Medicine | Admitting: Family Medicine

## 2018-11-17 DIAGNOSIS — R590 Localized enlarged lymph nodes: Secondary | ICD-10-CM | POA: Insufficient documentation

## 2018-11-17 MED ORDER — IOHEXOL 300 MG/ML  SOLN
100.0000 mL | Freq: Once | INTRAMUSCULAR | Status: AC | PRN
  Administered 2018-11-17: 100 mL via INTRAVENOUS

## 2018-11-19 ENCOUNTER — Other Ambulatory Visit: Payer: Self-pay | Admitting: Physician Assistant

## 2018-11-19 ENCOUNTER — Other Ambulatory Visit: Payer: Self-pay | Admitting: Radiology

## 2018-11-20 ENCOUNTER — Encounter (HOSPITAL_COMMUNITY): Payer: Self-pay

## 2018-11-20 ENCOUNTER — Ambulatory Visit (HOSPITAL_COMMUNITY)
Admission: RE | Admit: 2018-11-20 | Discharge: 2018-11-20 | Disposition: A | Discharge status: Home / Self Care | Payer: 59 | Source: Ambulatory Visit | Attending: Family Medicine | Admitting: Family Medicine

## 2018-11-20 ENCOUNTER — Other Ambulatory Visit: Payer: Self-pay

## 2018-11-20 DIAGNOSIS — R590 Localized enlarged lymph nodes: Secondary | ICD-10-CM | POA: Insufficient documentation

## 2018-11-20 LAB — CBC WITH DIFFERENTIAL/PLATELET
Abs Immature Granulocytes: 0 10*3/uL (ref 0.00–0.07)
Basophils Absolute: 0.1 10*3/uL (ref 0.0–0.1)
Basophils Relative: 3 %
Eosinophils Absolute: 0.4 10*3/uL (ref 0.0–0.5)
Eosinophils Relative: 9 %
HCT: 31.8 % — ABNORMAL LOW (ref 36.0–46.0)
Hemoglobin: 9.1 g/dL — ABNORMAL LOW (ref 12.0–15.0)
Lymphocytes Relative: 23 %
Lymphs Abs: 0.9 10*3/uL (ref 0.7–4.0)
MCH: 18.9 pg — ABNORMAL LOW (ref 26.0–34.0)
MCHC: 28.6 g/dL — ABNORMAL LOW (ref 30.0–36.0)
MCV: 66.1 fL — ABNORMAL LOW (ref 80.0–100.0)
Monocytes Absolute: 0.1 10*3/uL (ref 0.1–1.0)
Monocytes Relative: 3 %
Neutro Abs: 2.4 10*3/uL (ref 1.7–7.7)
Neutrophils Relative %: 62 %
Platelets: 226 10*3/uL (ref 150–400)
RBC: 4.81 MIL/uL (ref 3.87–5.11)
RDW: 22.1 % — ABNORMAL HIGH (ref 11.5–15.5)
WBC: 3.9 10*3/uL — ABNORMAL LOW (ref 4.0–10.5)
nRBC: 0 % (ref 0.0–0.2)
nRBC: 1 /100 WBC — ABNORMAL HIGH

## 2018-11-20 LAB — PROTIME-INR
INR: 1.1 (ref 0.8–1.2)
Prothrombin Time: 14.3 seconds (ref 11.4–15.2)

## 2018-11-20 LAB — PREGNANCY, URINE: Preg Test, Ur: NEGATIVE

## 2018-11-20 LAB — APTT: aPTT: 34 seconds (ref 24–36)

## 2018-11-20 MED ORDER — LIDOCAINE HCL (PF) 1 % IJ SOLN
INTRAMUSCULAR | Status: AC
  Filled 2018-11-20: qty 30

## 2018-11-20 MED ORDER — SODIUM CHLORIDE 0.9 % IV SOLN
INTRAVENOUS | Status: AC | PRN
  Administered 2018-11-20: 10 mL/h via INTRAVENOUS

## 2018-11-20 MED ORDER — FENTANYL CITRATE (PF) 100 MCG/2ML IJ SOLN
INTRAMUSCULAR | Status: AC
  Filled 2018-11-20: qty 2

## 2018-11-20 MED ORDER — HYDROCODONE-ACETAMINOPHEN 5-325 MG PO TABS: 1.0000 | ORAL_TABLET | ORAL | Status: DC | PRN

## 2018-11-20 MED ORDER — MIDAZOLAM HCL 2 MG/2ML IJ SOLN
INTRAMUSCULAR | Status: AC | PRN
  Administered 2018-11-20 (×2): 1 mg via INTRAVENOUS

## 2018-11-20 MED ORDER — SODIUM CHLORIDE 0.9 % IV SOLN: INTRAVENOUS | Status: DC

## 2018-11-20 MED ORDER — FENTANYL CITRATE (PF) 100 MCG/2ML IJ SOLN
INTRAMUSCULAR | Status: AC | PRN
  Administered 2018-11-20: 50 ug via INTRAVENOUS

## 2018-11-20 MED ORDER — MIDAZOLAM HCL 2 MG/2ML IJ SOLN
INTRAMUSCULAR | Status: AC
  Filled 2018-11-20: qty 2

## 2018-11-20 NOTE — Procedures (Signed)
Interventional Radiology Procedure:   Indications: Left inguinal mass/lymphadenopathy  Procedure: US guided core biopsy  Findings: Large left inguinal lymph node/mass.  Core specimens obtained and placed in saline  Complications: None     EBL: Less than 20 ml  Plan: Bedrest 1 hour, then discharge to home.     Amire Leazer R. Anselm Pancoast, MD  Pager: [phone]

## 2018-11-20 NOTE — Discharge Instructions (Signed)
Needle Biopsy, Care After °These instructions tell you how to care for yourself after your procedure. Your doctor may also give you more specific instructions. Call your doctor if you have any problems or questions. °What can I expect after the procedure? °After the procedure, it is common to have: °· Soreness. °· Bruising. °· Mild pain. °Follow these instructions at home: ° °· Return to your normal activities as told by your doctor. Ask your doctor what activities are safe for you. °· Take over-the-counter and prescription medicines only as told by your doctor. °· Wash your hands with soap and water before you change your bandage (dressing). If you cannot use soap and water, use hand sanitizer. °· Follow instructions from your doctor about: °? How to take care of your puncture site. °? When and how to change your bandage. °? When to remove your bandage. °· Check your puncture site every day for signs of infection. Watch for: °? Redness, swelling, or pain. °? Fluid or blood.  °? Pus or a bad smell. °? Warmth. °· Do not take baths, swim, or use a hot tub until your doctor approves. Ask your doctor if you may take showers. You may only be allowed to take sponge baths. °· Keep all follow-up visits as told by your doctor. This is important. °Contact a doctor if you have: °· A fever. °· Redness, swelling, or pain at the puncture site, and it lasts longer than a few days. °· Fluid, blood, or pus coming from the puncture site. °· Warmth coming from the puncture site. °Get help right away if: °· You have a lot of bleeding from the puncture site. °Summary °· After the procedure, it is common to have soreness, bruising, or mild pain at the puncture site. °· Check your puncture site every day for signs of infection, such as redness, swelling, or pain. °· Get help right away if you have severe bleeding from your puncture site. °This information is not intended to replace advice given to you by your health care provider. Make  sure you discuss any questions you have with your health care provider. °Document Released: 05/30/2008 Document Revised: 06/30/2017 Document Reviewed: 06/30/2017 °Elsevier Interactive Patient Education © 2019 [employer] ° °

## 2018-11-20 NOTE — H&P (Signed)
Chief Complaint: Patient was seen in consultation today for left inguinal lymph node biopsy at the request of [name]  Referring Physician(s): [name]  Supervising Physician: Markus Daft  Patient Status: [facility] - Out-pt  History of Present Illness: Leah Gould is a 23 y.o. female   Pt noted L inguinal LN years ago but went away. Recurred almost a yr ago-- enlarged and is sore Has not resolved Has taken recent antibiotic for bacterial vaginits  CT 11/17/18:  IMPRESSION: 1. Enlarged presumed lymph node in the left inguinal region measuring 5.2 x 3.9 x 3.8 cm. There is enhancement of this lymph node. Etiology for this localized lymph node prominence is uncertain. Tissue sampling may well be warranted given this finding. No other adenopathy noted in the abdomen or pelvis. 2.  Spleen does not appear appreciably enlarged. 3. Wall thickening in the urinary bladder. Suspect a degree of cystitis. No renal or ureteral calculus. No hydronephrosis. 4. Apparent recent ovarian cyst rupture on the right with mild fluid tracking from the right adnexa and cul-de-sac. 5. No evident bowel obstruction or bowel wall thickening. No abscess in the abdomen pelvis. No periappendiceal region inflammatory change.  Scheduled now for biopsy of left inguinal LAN   Past Medical History:  Diagnosis Date   Asthma     History reviewed. No pertinent surgical history.  Allergies: Fruit & vegetable daily [nutritional supplements] and Peanut-containing drug products  Medications: Prior to Admission medications   Medication Sig Start Date End Date Taking? Authorizing Provider  Prenatal Vit-Fe Fumarate-FA (PRENATAL VITAMIN) 27-0.8 MG TABS Take 1 tablet by mouth daily. 10/05/18  Yes [name], MD  metroNIDAZOLE (FLAGYL) 500 MG tablet Take 1 tablet (500 mg total) by mouth 2 (two) times daily. 10/17/18   Gavin Pound, CNM     History reviewed. No pertinent family  history.  Social History   Socioeconomic History   Marital status: Single    Spouse name: Not on file   Number of children: Not on file   Years of education: Not on file   Highest education level: Not on file  Occupational History   Not on file  Social Needs   Financial resource strain: Not on file   Food insecurity:    Worry: Not on file    Inability: Not on file   Transportation needs:    Medical: Not on file    Non-medical: Not on file  Tobacco Use   Smoking status: Never Smoker   Smokeless tobacco: Never Used  Substance and Sexual Activity   Alcohol use: No   Drug use: No   Sexual activity: Yes    Birth control/protection: None  Lifestyle   Physical activity:    Days per week: Not on file    Minutes per session: Not on file   Stress: Not on file  Relationships   Social connections:    Talks on phone: Not on file    Gets together: Not on file    Attends religious service: Not on file    Active member of club or organization: Not on file    Attends meetings of clubs or organizations: Not on file    Relationship status: Not on file  Other Topics Concern   Not on file  Social History Narrative   Not on file    Review of Systems: A 12 point ROS discussed and pertinent positives are indicated in the HPI above.  All other systems are negative.  Review of Systems  Constitutional: Negative for activity change, fatigue and fever.  Respiratory: Negative for cough and shortness of breath.   Genitourinary: Negative for vaginal discharge.  Psychiatric/Behavioral: Negative for behavioral problems and confusion.    Vital Signs: BP 103/62    Pulse 74    Temp 98.8 F (37.1 C) (Oral)    Resp 18    Ht 5\' 3"  (1.6 m)    Wt 98 lb (44.5 kg)    LMP 10/29/2018 (Exact Date)    SpO2 99%    Breastfeeding Unknown    BMI 17.36 kg/m   Physical Exam Vitals signs reviewed.  Cardiovascular:     Rate and Rhythm: Normal rate and regular rhythm.     Heart sounds:  Normal heart sounds.  Pulmonary:     Effort: Pulmonary effort is normal.     Breath sounds: Normal breath sounds.  Abdominal:     General: Bowel sounds are normal.  Musculoskeletal: Normal range of motion.  Skin:    General: Skin is warm and dry.  Neurological:     Mental Status: She is alert and oriented to person, place, and time.  Psychiatric:        Mood and Affect: Mood normal.        Behavior: Behavior normal.        Thought Content: Thought content normal.        Judgment: Judgment normal.     Imaging: Ct Abdomen Pelvis W Contrast  Result Date: 11/18/2018 CLINICAL DATA:  Abdominal cramping and diarrhea; enlarged inguinal lymph node EXAM: CT ABDOMEN AND PELVIS WITH CONTRAST TECHNIQUE: Multidetector CT imaging of the abdomen and pelvis was performed using the standard protocol following bolus administration of intravenous contrast. Oral contrast was also administered. CONTRAST:  139mL OMNIPAQUE IOHEXOL 300 MG/ML  SOLN COMPARISON:  None. FINDINGS: Lower chest: Lung bases are clear. Hepatobiliary: There is hepatic steatosis. No focal liver lesions evident. Gallbladder wall is not appreciably thickened. There is no biliary duct dilatation. Pancreas: No pancreatic mass or inflammatory focus. Spleen: No splenic lesions are demonstrable. Adrenals/Urinary Tract: Adrenals bilaterally appear normal. Kidneys bilaterally show no evident mass or hydronephrosis on either side. There is no evident renal or ureteral calculus on either side. Urinary bladder is midline with wall thickness slightly increased. Stomach/Bowel: There is no appreciable bowel wall or mesenteric thickening. There is no evident bowel obstruction. There is no appreciable free air or portal venous air. Terminal ileum appears unremarkable. Vascular/Lymphatic: No abdominal aortic aneurysm. No vascular lesions are evident. There is a mass, presumably an enlarged lymph node, in the left inguinal region measuring 5.2 x 3.9 x 3.8 cm. This  lymph node shows uniform enhancement. No adenopathy elsewhere in the abdomen or pelvis is apparent. Reproductive: Uterus is anteverted. There is enhancement along the periphery of an ovarian cystic lesion measuring 2.4 x 1.9 cm, likely due to recent ovarian cyst rupture. A small amount of fluid tracks from the right adnexa into the cul-de-sac. Other: Appendix appears unremarkable. There is no abscess or ascites in the abdomen or pelvis. Musculoskeletal: There are no blastic or lytic bone lesions. There is no intramuscular or abdominal wall lesion. IMPRESSION: 1. Enlarged presumed lymph node in the left inguinal region measuring 5.2 x 3.9 x 3.8 cm. There is enhancement of this lymph node. Etiology for this localized lymph node prominence is uncertain. Tissue sampling may well be warranted given this finding. No other adenopathy noted in the abdomen or pelvis. 2.  Spleen does not appear appreciably enlarged. 3.  Wall thickening in the urinary bladder. Suspect a degree of cystitis. No renal or ureteral calculus. No hydronephrosis. 4. Apparent recent ovarian cyst rupture on the right with mild fluid tracking from the right adnexa and cul-de-sac. 5. No evident bowel obstruction or bowel wall thickening. No abscess in the abdomen pelvis. No periappendiceal region inflammatory change. These results will be called to the ordering clinician or representative by the Radiologist Assistant, and communication documented in the PACS or zVision Dashboard. Electronically Signed   By: Lowella Grip III M.D.   On: 11/18/2018 08:14    Labs:  CBC: Recent Labs    10/05/18 1450 10/07/18 1229  WBC 5.2 6.6  HGB 8.1* 8.1*  HCT 29.3* 29.2*  PLT 387 341    COAGS: Recent Labs    11/20/18 1114  INR 1.1  APTT 34    BMP: No results for input(s): NA, K, CL, CO2, GLUCOSE, BUN, CALCIUM, CREATININE, GFRNONAA, GFRAA in the last 8760 hours.  Invalid input(s): CMP  LIVER FUNCTION TESTS: No results for input(s): BILITOT,  AST, ALT, ALKPHOS, PROT, ALBUMIN in the last 8760 hours.  TUMOR MARKERS: No results for input(s): AFPTM, CEA, CA199, CHROMGRNA in the last 8760 hours.  Assessment and Plan:  Left inguinal LAN Enlarging Not resolved in a yr Scheduled for biopsy of same Risks and benefits of left inguinal lymph node biopsy was discussed with the patient and/or patient's family including, but not limited to bleeding, infection, damage to adjacent structures or low yield requiring additional tests.  All of the questions were answered and there is agreement to proceed. Consent signed and in chart.   Thank you for this interesting consult.  I greatly enjoyed meeting Trivia C. Dunkel and look forward to participating in their care.  A copy of this report was sent to the requesting provider on this date.  Electronically Signed: Lavonia Drafts, PA-C 11/20/2018, 12:30 PM   I spent a total of  30 Minutes   in face to face in clinical consultation, greater than 50% of which was counseling/coordinating care for left inguinal LAN bx

## 2018-12-08 ENCOUNTER — Other Ambulatory Visit: Payer: Self-pay | Admitting: General Surgery

## 2018-12-08 DIAGNOSIS — N6310 Unspecified lump in the right breast, unspecified quadrant: Secondary | ICD-10-CM

## 2018-12-15 ENCOUNTER — Encounter (HOSPITAL_BASED_OUTPATIENT_CLINIC_OR_DEPARTMENT_OTHER): Payer: Self-pay | Admitting: *Deleted

## 2018-12-15 ENCOUNTER — Other Ambulatory Visit: Payer: Self-pay

## 2018-12-18 ENCOUNTER — Other Ambulatory Visit (HOSPITAL_COMMUNITY)
Admission: RE | Admit: 2018-12-18 | Discharge: 2018-12-18 | Disposition: A | Discharge status: Home / Self Care | Payer: 59 | Source: Ambulatory Visit | Attending: General Surgery | Admitting: General Surgery

## 2018-12-18 DIAGNOSIS — Z1159 Encounter for screening for other viral diseases: Secondary | ICD-10-CM | POA: Diagnosis not present

## 2018-12-18 LAB — SARS CORONAVIRUS 2 (TAT 6-24 HRS): SARS Coronavirus 2: NEGATIVE

## 2018-12-19 NOTE — H&P (Addendum)
Leah Gould Location: [facility] Patient #: [id number] DOB: [birth year] Single / Language: Leah Molt / Race: Black or African American Female        History of Present Illness         This is a healthy, pleasant, 23 year old female, referred by Leah Blalock, PA at [place] [facility] for evaluation of left inguinal adenopathy. She has been seen at [place].      She states that she had a left groin mass for about 10 years but it has significantly enlarged over the last year. Not really painful. Never infected. No other enlarged lymph nodes. She denies fevers or night sweats. Denies weight loss. Has regular menses.       She said that she also felt some tender lumpiness in the central lateral right breast which comes and goes.      On Nov 17, 2018 she had CT scan of abdomen and pelvis which showed a 5 cm left inguinal lymph node. Spleen looked normal. Slight bladder wall thickening suggesting cystitis. Possible recently ruptured ovarian cyst. On Nov 20, 2018 she underwent ultrasound-guided core biopsy. The path showed atypical lymphocytes highly suspicious for lymphoma. Next lab work shows hemoglobin 9.1. White count 3900. Platelet count 226,000.     Past history reveals she had a miscarriage in April 2020. That's her only pregnancy. Chronic anemia family history reveals mother and father living and well delivered Leah Gould. No cancer syndromes in the family. No lymphoma or leukemia and the family.  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 Single. Lives with roommates. Works as a Scientist, water quality at [employer]. Denies alcohol or tobacco.      Exam reveals a 4-5 cm left inguinal lymph node. No adenopathy elsewhere. No skin lesions.      She will be scheduled for excision of deep left inguinal lymph node under general anesthesia as outpatient. I discussed the indications, details, techniques, and he was risk of the surgery  with her. She is aware of the risk of bleeding, infection, seroma or lymphocele formation, nerve damage with chronic pain or numbness. She understands all these issues. All questions were answered. She agrees with this plan. I suggested that she might want to stay at her parents house for a couple of days.   Past Surgical History  No pertinent past surgical history   Diagnostic Studies History  Colonoscopy  never Mammogram  never Pap Smear  never  Allergies No Known Drug Allergies  Allergies Reconciled   Medication History  No Current Medications Medications Reconciled  Social History  Alcohol use  Occasional alcohol use. Caffeine use  Carbonated beverages. No drug use   Family History  Arthritis  Family Members In General. Cancer  Family Members In General. Cerebrovascular Accident  Family Members In General. Diabetes Mellitus  Family Members In General. Heart Disease  Family Members In General. Hypertension  Family Members In General. Migraine Headache  Family Members In General, Mother. Prostate Cancer  Family Members In General.  Pregnancy / Birth History  Age at menarche  69 years. Gravida  0 Maternal age  44-25 Para  0 Regular periods   Other Problems  Asthma     Review of Systems  General Not Present- Appetite Loss, Chills, Fatigue, Fever, Night Sweats, Weight Gain and Weight Loss. Skin Present- Dryness. Not Present- Change in Wart/Mole, Hives, Jaundice, New Lesions, Non-Healing Wounds, Rash and Ulcer. HEENT Present- Seasonal Allergies. Not Present- Earache, Hearing Loss,  Hoarseness, Nose Bleed, Oral Ulcers, Ringing in the Ears, Sinus Pain, Sore Throat, Visual Disturbances, Wears glasses/contact lenses and Yellow Eyes. Respiratory Not Present- Bloody sputum, Chronic Cough, Difficulty Breathing, Snoring and Wheezing. Breast Not Present- Breast Mass, Breast Pain, Nipple Discharge and Skin Changes. Cardiovascular Not Present- Chest  Pain, Difficulty Breathing Lying Down, Leg Cramps, Palpitations, Rapid Heart Rate, Shortness of Breath and Swelling of Extremities. Gastrointestinal Present- Abdominal Pain and Gets full quickly at meals. Not Present- Bloating, Bloody Stool, Change in Bowel Habits, Chronic diarrhea, Constipation, Difficulty Swallowing, Excessive gas, Hemorrhoids, Indigestion, Nausea, Rectal Pain and Vomiting. Female Genitourinary Present- Frequency and Nocturia. Not Present- Painful Urination, Pelvic Pain and Urgency. Musculoskeletal Not Present- Back Pain, Joint Pain, Joint Stiffness, Muscle Pain, Muscle Weakness and Swelling of Extremities. Neurological Not Present- Decreased Memory, Fainting, Headaches, Numbness, Seizures, Tingling, Tremor, Trouble walking and Weakness. Psychiatric Not Present- Anxiety, Bipolar, Change in Sleep Pattern, Depression, Fearful and Frequent crying. Endocrine Not Present- Cold Intolerance, Excessive Hunger, Hair Changes, Heat Intolerance, Hot flashes and New Diabetes. Hematology Not Present- Blood Thinners, Easy Bruising, Excessive bleeding, Gland problems, HIV and Persistent Infections.  Vitals  Weight: 101.5 lb Height: 63in Body Surface Area: 1.45 m Body Mass Index: 17.98 kg/m  Temp.: 99.44F (Oral)  Pulse: 101 (Regular)  BP: 110/62(Sitting, Left Arm, Standard)       Physical Exam  General Mental Status-Alert. General Appearance-Consistent with stated age. Hydration-Well hydrated. Voice-Normal.  Head and Neck Head-normocephalic, atraumatic with no lesions or palpable masses. Trachea-midline. Thyroid Gland Characteristics - normal size and consistency.  Eye Eyeball - Bilateral-Extraocular movements intact. Sclera/Conjunctiva - Bilateral-No scleral icterus.  Chest and Lung Exam Chest and lung exam reveals -quiet, even and easy respiratory effort with no use of accessory muscles and on auscultation, normal breath sounds, no  adventitious sounds and normal vocal resonance. Inspection Chest Wall - Normal. Back - normal.  Breast Note: Breasts are small. Skin, nipple and areola healthy. There is a little bit of nonfocal lumpiness in the central and lateral right breast that she asked me to look at. There is a tiny palpable right axillary lymph node. No lymph nodes in the left axilla   Cardiovascular Cardiovascular examination reveals -normal heart sounds, regular rate and rhythm with no murmurs and normal pedal pulses bilaterally.  Abdomen Inspection Inspection of the abdomen reveals - No Hernias. Skin - Scar - no surgical scars. Palpation/Percussion Palpation and Percussion of the abdomen reveal - Soft, Non Tender, No Rebound tenderness, No Rigidity (guarding) and No hepatosplenomegaly. Auscultation Auscultation of the abdomen reveals - Bowel sounds normal.  Neurologic Neurologic evaluation reveals -alert and oriented x 3 with no impairment of recent or remote memory. Mental Status-Normal.  Musculoskeletal Normal Exam - Left-Upper Extremity Strength Normal and Lower Extremity Strength Normal. Normal Exam - Right-Upper Extremity Strength Normal and Lower Extremity Strength Normal.  Lymphatic Head & Neck  General Head & Neck Lymphatics: Bilateral - Description - Normal. Axillary  General Axillary Region: Bilateral - Description - Normal. Tenderness - Non Tender. Femoral & Inguinal  Generalized Femoral & Inguinal Lymphatics: Bilateral - Description - Normal. Tenderness - Non Tender. Note: No mass in the supraclavicular or cervical region Right axilla reveals a tiny mobile nontender lymph node. Left axilla is negative Right inguinal area is negative Left inguinal area reveals an obvious, 4-5 cm nontender mass consistent with an inguinal lymph node. Skin is healthy overlying this.     Assessment & Plan  INGUINAL ADENOPATHY (R59.0)   The lymph node in your  left inguinal area is  abnormally enlarged It is probably 4 cm in diameter The needle biopsy showed abnormal lymphoid cells. They were suspicious of lymphoma but could not make a diagnosis The entire lymph node needs to be removed so that it can be properly examined under the microscope and in the lab  you agree with this plan you'll be scheduled for excisional biopsy of left inguinal lymph node under general anesthesia in the near future I have discussed the indications, techniques, and risk of the surgery in detail with you  you will go home the same day. Perhaps your parents can drive you home You may drive your car in a couple of days It would be nice if you could stay out of work for 5-7 days for wound healing  CHRONIC ANEMIA (D64.9)  BREAST PAIN, RIGHT (N64.4)  Examination of her breast reveals no definite abnormality. They are just a little bit lumpy which is a normal variant To be sure we will obtain an ultrasound of the right breast (this is pending as of 12/19/2018) Call Dr. Dalbert Batman after that is done to discuss   [facility]. Dalbert Batman, M.D., [facility] General and Minimally invasive Surgery Breast and Colorectal Surgery Office:   [phone] Pager:   [phone]

## 2018-12-22 ENCOUNTER — Ambulatory Visit (HOSPITAL_BASED_OUTPATIENT_CLINIC_OR_DEPARTMENT_OTHER)
Admission: RE | Admit: 2018-12-22 | Discharge: 2018-12-22 | Disposition: A | Discharge status: Home / Self Care | Payer: 59 | Attending: General Surgery | Admitting: General Surgery

## 2018-12-22 ENCOUNTER — Encounter (HOSPITAL_BASED_OUTPATIENT_CLINIC_OR_DEPARTMENT_OTHER)
Admission: RE | Disposition: A | Discharge status: Home / Self Care | Payer: Self-pay | Source: Home / Self Care | Attending: General Surgery

## 2018-12-22 ENCOUNTER — Ambulatory Visit (HOSPITAL_BASED_OUTPATIENT_CLINIC_OR_DEPARTMENT_OTHER): Payer: 59 | Admitting: Certified Registered"

## 2018-12-22 ENCOUNTER — Encounter (HOSPITAL_BASED_OUTPATIENT_CLINIC_OR_DEPARTMENT_OTHER): Payer: Self-pay

## 2018-12-22 ENCOUNTER — Other Ambulatory Visit: Payer: Self-pay

## 2018-12-22 DIAGNOSIS — D479 Neoplasm of uncertain behavior of lymphoid, hematopoietic and related tissue, unspecified: Secondary | ICD-10-CM | POA: Diagnosis not present

## 2018-12-22 DIAGNOSIS — R590 Localized enlarged lymph nodes: Secondary | ICD-10-CM

## 2018-12-22 HISTORY — DX: Localized enlarged lymph nodes: R59.0

## 2018-12-22 HISTORY — PX: MASS EXCISION: SHX2000

## 2018-12-22 HISTORY — DX: Anemia, unspecified: D64.9

## 2018-12-22 LAB — CBC WITH DIFFERENTIAL/PLATELET
Abs Immature Granulocytes: 0.01 10*3/uL (ref 0.00–0.07)
Basophils Absolute: 0.1 10*3/uL (ref 0.0–0.1)
Basophils Relative: 2 %
Eosinophils Absolute: 0.2 10*3/uL (ref 0.0–0.5)
Eosinophils Relative: 5 %
HCT: 32.5 % — ABNORMAL LOW (ref 36.0–46.0)
Hemoglobin: 9.4 g/dL — ABNORMAL LOW (ref 12.0–15.0)
Immature Granulocytes: 0 %
Lymphocytes Relative: 40 %
Lymphs Abs: 1.4 10*3/uL (ref 0.7–4.0)
MCH: 19 pg — ABNORMAL LOW (ref 26.0–34.0)
MCHC: 28.9 g/dL — ABNORMAL LOW (ref 30.0–36.0)
MCV: 65.7 fL — ABNORMAL LOW (ref 80.0–100.0)
Monocytes Absolute: 0.3 10*3/uL (ref 0.1–1.0)
Monocytes Relative: 10 %
Neutro Abs: 1.5 10*3/uL — ABNORMAL LOW (ref 1.7–7.7)
Neutrophils Relative %: 43 %
Platelets: 308 10*3/uL (ref 150–400)
RBC: 4.95 MIL/uL (ref 3.87–5.11)
RDW: 21.3 % — ABNORMAL HIGH (ref 11.5–15.5)
WBC: 3.5 10*3/uL — ABNORMAL LOW (ref 4.0–10.5)
nRBC: 0 % (ref 0.0–0.2)

## 2018-12-22 LAB — COMPREHENSIVE METABOLIC PANEL
ALT: 18 U/L (ref 0–44)
AST: 29 U/L (ref 15–41)
Albumin: 4.5 g/dL (ref 3.5–5.0)
Alkaline Phosphatase: 49 U/L (ref 38–126)
Anion gap: 10 (ref 5–15)
BUN: 11 mg/dL (ref 6–20)
CO2: 24 mmol/L (ref 22–32)
Calcium: 9.6 mg/dL (ref 8.9–10.3)
Chloride: 104 mmol/L (ref 98–111)
Creatinine, Ser: 0.83 mg/dL (ref 0.44–1.00)
GFR calc Af Amer: >60 mL/min (ref >60)
GFR calc non Af Amer: >60 mL/min (ref >60)
Glucose, Bld: 93 mg/dL (ref 70–99)
Potassium: 3.3 mmol/L — ABNORMAL LOW (ref 3.5–5.1)
Sodium: 138 mmol/L (ref 135–145)
Total Bilirubin: 0.4 mg/dL (ref 0.3–1.2)
Total Protein: 7.5 g/dL (ref 6.5–8.1)

## 2018-12-22 LAB — POCT PREGNANCY, URINE: Preg Test, Ur: NEGATIVE

## 2018-12-22 SURGERY — EXCISION MASS
Anesthesia: General | Site: Groin | Laterality: Left

## 2018-12-22 MED ORDER — GABAPENTIN 300 MG PO CAPS
300.0000 mg | ORAL_CAPSULE | ORAL | Status: AC
  Administered 2018-12-22: 300 mg via ORAL

## 2018-12-22 MED ORDER — ACETAMINOPHEN 500 MG PO TABS
ORAL_TABLET | ORAL | Status: AC
  Filled 2018-12-22: qty 2

## 2018-12-22 MED ORDER — ACETAMINOPHEN 500 MG PO TABS
1000.0000 mg | ORAL_TABLET | ORAL | Status: AC
  Administered 2018-12-22: 1000 mg via ORAL

## 2018-12-22 MED ORDER — CHLORHEXIDINE GLUCONATE CLOTH 2 % EX PADS: 6.0000 | MEDICATED_PAD | Freq: Once | CUTANEOUS | Status: DC

## 2018-12-22 MED ORDER — LACTATED RINGERS IV SOLN
INTRAVENOUS | Status: DC
  Administered 2018-12-22: 10:00:00 via INTRAVENOUS

## 2018-12-22 MED ORDER — HYDROCODONE-ACETAMINOPHEN 5-325 MG PO TABS: 1.0000 | ORAL_TABLET | Freq: Four times a day (QID) | ORAL | 0 refills | Status: DC | PRN

## 2018-12-22 MED ORDER — CELECOXIB 200 MG PO CAPS
200.0000 mg | ORAL_CAPSULE | ORAL | Status: AC
  Administered 2018-12-22: 200 mg via ORAL

## 2018-12-22 MED ORDER — MIDAZOLAM HCL 2 MG/2ML IJ SOLN
INTRAMUSCULAR | Status: AC
  Filled 2018-12-22: qty 2

## 2018-12-22 MED ORDER — CELECOXIB 200 MG PO CAPS
ORAL_CAPSULE | ORAL | Status: AC
  Filled 2018-12-22: qty 1

## 2018-12-22 MED ORDER — FENTANYL CITRATE (PF) 100 MCG/2ML IJ SOLN
25.0000 ug | INTRAMUSCULAR | Status: DC | PRN
  Administered 2018-12-22: 25 ug via INTRAVENOUS

## 2018-12-22 MED ORDER — ACETAMINOPHEN 500 MG PO TABS: 1000.0000 mg | ORAL_TABLET | Freq: Four times a day (QID) | ORAL | Status: DC

## 2018-12-22 MED ORDER — OXYCODONE HCL 5 MG PO TABS: 5.0000 mg | ORAL_TABLET | ORAL | Status: DC | PRN

## 2018-12-22 MED ORDER — MIDAZOLAM HCL 5 MG/5ML IJ SOLN
INTRAMUSCULAR | Status: DC | PRN
  Administered 2018-12-22: 2 mg via INTRAVENOUS

## 2018-12-22 MED ORDER — PHENYLEPHRINE 40 MCG/ML (10ML) SYRINGE FOR IV PUSH (FOR BLOOD PRESSURE SUPPORT)
PREFILLED_SYRINGE | INTRAVENOUS | Status: AC
  Filled 2018-12-22: qty 10

## 2018-12-22 MED ORDER — MEPERIDINE HCL 25 MG/ML IJ SOLN: 6.2500 mg | INTRAMUSCULAR | Status: DC | PRN

## 2018-12-22 MED ORDER — LACTATED RINGERS IV SOLN: INTRAVENOUS | Status: DC

## 2018-12-22 MED ORDER — PHENYLEPHRINE 40 MCG/ML (10ML) SYRINGE FOR IV PUSH (FOR BLOOD PRESSURE SUPPORT)
PREFILLED_SYRINGE | INTRAVENOUS | Status: DC | PRN
  Administered 2018-12-22 (×2): 80 ug via INTRAVENOUS

## 2018-12-22 MED ORDER — CEFAZOLIN SODIUM-DEXTROSE 2-4 GM/100ML-% IV SOLN
INTRAVENOUS | Status: AC
  Filled 2018-12-22: qty 100

## 2018-12-22 MED ORDER — FENTANYL CITRATE (PF) 100 MCG/2ML IJ SOLN: 50.0000 ug | INTRAMUSCULAR | Status: DC | PRN

## 2018-12-22 MED ORDER — CEFAZOLIN SODIUM-DEXTROSE 2-4 GM/100ML-% IV SOLN
2.0000 g | INTRAVENOUS | Status: AC
  Administered 2018-12-22: 2 g via INTRAVENOUS

## 2018-12-22 MED ORDER — LIDOCAINE 2% (20 MG/ML) 5 ML SYRINGE
INTRAMUSCULAR | Status: DC | PRN
  Administered 2018-12-22: 50 mg via INTRAVENOUS

## 2018-12-22 MED ORDER — GABAPENTIN 300 MG PO CAPS
ORAL_CAPSULE | ORAL | Status: AC
  Filled 2018-12-22: qty 1

## 2018-12-22 MED ORDER — SODIUM CHLORIDE 0.9% FLUSH: 3.0000 mL | Freq: Two times a day (BID) | INTRAVENOUS | Status: DC

## 2018-12-22 MED ORDER — SODIUM CHLORIDE 0.9 % IV SOLN: 250.0000 mL | INTRAVENOUS | Status: DC | PRN

## 2018-12-22 MED ORDER — DEXAMETHASONE SODIUM PHOSPHATE 10 MG/ML IJ SOLN
INTRAMUSCULAR | Status: DC | PRN
  Administered 2018-12-22: 5 mg via INTRAVENOUS

## 2018-12-22 MED ORDER — FENTANYL CITRATE (PF) 250 MCG/5ML IJ SOLN
INTRAMUSCULAR | Status: DC | PRN
  Administered 2018-12-22: 25 ug via INTRAVENOUS

## 2018-12-22 MED ORDER — ONDANSETRON HCL 4 MG/2ML IJ SOLN
INTRAMUSCULAR | Status: DC | PRN
  Administered 2018-12-22: 4 mg via INTRAVENOUS

## 2018-12-22 MED ORDER — METOCLOPRAMIDE HCL 5 MG/ML IJ SOLN: 10.0000 mg | Freq: Once | INTRAMUSCULAR | Status: DC | PRN

## 2018-12-22 MED ORDER — MIDAZOLAM HCL 2 MG/2ML IJ SOLN: 1.0000 mg | INTRAMUSCULAR | Status: DC | PRN

## 2018-12-22 MED ORDER — ACETAMINOPHEN 325 MG PO TABS: 650.0000 mg | ORAL_TABLET | ORAL | Status: DC | PRN

## 2018-12-22 MED ORDER — PROPOFOL 10 MG/ML IV BOLUS
INTRAVENOUS | Status: AC
  Filled 2018-12-22: qty 20

## 2018-12-22 MED ORDER — PROPOFOL 10 MG/ML IV BOLUS
INTRAVENOUS | Status: DC | PRN
  Administered 2018-12-22: 160 mg via INTRAVENOUS

## 2018-12-22 MED ORDER — FENTANYL CITRATE (PF) 100 MCG/2ML IJ SOLN: 25.0000 ug | INTRAMUSCULAR | Status: DC | PRN

## 2018-12-22 MED ORDER — SCOPOLAMINE 1 MG/3DAYS TD PT72: 1.0000 | MEDICATED_PATCH | Freq: Once | TRANSDERMAL | Status: DC

## 2018-12-22 MED ORDER — BUPIVACAINE-EPINEPHRINE 0.5% -1:200000 IJ SOLN
INTRAMUSCULAR | Status: DC | PRN
  Administered 2018-12-22: 5 mL

## 2018-12-22 MED ORDER — LIDOCAINE 2% (20 MG/ML) 5 ML SYRINGE
INTRAMUSCULAR | Status: AC
  Filled 2018-12-22: qty 10

## 2018-12-22 MED ORDER — FENTANYL CITRATE (PF) 100 MCG/2ML IJ SOLN
INTRAMUSCULAR | Status: AC
  Filled 2018-12-22: qty 2

## 2018-12-22 MED ORDER — ACETAMINOPHEN 650 MG RE SUPP: 650.0000 mg | RECTAL | Status: DC | PRN

## 2018-12-22 MED ORDER — SODIUM CHLORIDE 0.9% FLUSH: 3.0000 mL | INTRAVENOUS | Status: DC | PRN

## 2018-12-22 MED ORDER — DIPHENHYDRAMINE HCL 50 MG/ML IJ SOLN
INTRAMUSCULAR | Status: AC
  Filled 2018-12-22: qty 1

## 2018-12-22 SURGICAL SUPPLY — 63 items
ADH SKN CLS APL DERMABOND .7 (GAUZE/BANDAGES/DRESSINGS) ×1
APL PRP STRL LF DISP 70% ISPRP (MISCELLANEOUS) ×1
APL SKNCLS STERI-STRIP NONHPOA (GAUZE/BANDAGES/DRESSINGS)
APPLIER CLIP 9.375 MED OPEN (MISCELLANEOUS) ×2
APR CLP MED 9.3 20 MLT OPN (MISCELLANEOUS) ×1
BANDAGE ACE 6X5 VEL STRL LF (GAUZE/BANDAGES/DRESSINGS) IMPLANT
BENZOIN TINCTURE PRP APPL 2/3 (GAUZE/BANDAGES/DRESSINGS) IMPLANT
BLADE HEX COATED 2.75 (ELECTRODE) ×2 IMPLANT
BLADE SURG 15 STRL LF DISP TIS (BLADE) ×2 IMPLANT
BLADE SURG 15 STRL SS (BLADE) ×2
CANISTER SUCT 1200ML W/VALVE (MISCELLANEOUS) ×2 IMPLANT
CHLORAPREP W/TINT 26 (MISCELLANEOUS) ×2 IMPLANT
CLIP APPLIE 9.375 MED OPEN (MISCELLANEOUS) IMPLANT
COVER BACK TABLE REUSABLE LG (DRAPES) ×2 IMPLANT
COVER MAYO STAND REUSABLE (DRAPES) ×2 IMPLANT
COVER WAND RF STERILE (DRAPES) IMPLANT
DECANTER SPIKE VIAL GLASS SM (MISCELLANEOUS) IMPLANT
DERMABOND ADVANCED (GAUZE/BANDAGES/DRESSINGS) ×1
DERMABOND ADVANCED .7 DNX12 (GAUZE/BANDAGES/DRESSINGS) IMPLANT
DRAPE LAPAROTOMY 100X72 PEDS (DRAPES) ×1 IMPLANT
DRAPE LAPAROTOMY TRNSV 102X78 (DRAPE) ×1 IMPLANT
DRAPE UTILITY XL STRL (DRAPES) ×2 IMPLANT
ELECT REM PT RETURN 9FT ADLT (ELECTROSURGICAL) ×2
ELECTRODE REM PT RTRN 9FT ADLT (ELECTROSURGICAL) ×1 IMPLANT
GAUZE 4X4 16PLY RFD (DISPOSABLE) IMPLANT
GAUZE SPONGE 4X4 12PLY STRL LF (GAUZE/BANDAGES/DRESSINGS) IMPLANT
GLOVE BIOGEL PI IND STRL 7.0 (GLOVE) IMPLANT
GLOVE BIOGEL PI INDICATOR 7.0 (GLOVE) ×1
GLOVE ECLIPSE 7.5 STRL STRAW (GLOVE) ×1 IMPLANT
GLOVE EUDERMIC 7 POWDERFREE (GLOVE) ×2 IMPLANT
GLOVE EXAM NITRILE MD LF STRL (GLOVE) ×1 IMPLANT
GOWN STRL REUS W/ TWL LRG LVL3 (GOWN DISPOSABLE) ×1 IMPLANT
GOWN STRL REUS W/ TWL XL LVL3 (GOWN DISPOSABLE) ×1 IMPLANT
GOWN STRL REUS W/TWL LRG LVL3 (GOWN DISPOSABLE)
GOWN STRL REUS W/TWL XL LVL3 (GOWN DISPOSABLE) ×4
NDL HYPO 25X1 1.5 SAFETY (NEEDLE) ×1 IMPLANT
NEEDLE HYPO 22GX1.5 SAFETY (NEEDLE) IMPLANT
NEEDLE HYPO 25X1 1.5 SAFETY (NEEDLE) ×2 IMPLANT
NS IRRIG 1000ML POUR BTL (IV SOLUTION) ×2 IMPLANT
PACK BASIN DAY SURGERY FS (CUSTOM PROCEDURE TRAY) ×2 IMPLANT
PENCIL BUTTON HOLSTER BLD 10FT (ELECTRODE) ×2 IMPLANT
SHEET MEDIUM DRAPE 40X70 STRL (DRAPES) IMPLANT
SLEEVE SCD COMPRESS KNEE MED (MISCELLANEOUS) ×1 IMPLANT
SPONGE LAP 4X18 RFD (DISPOSABLE) ×2 IMPLANT
STAPLER VISISTAT 35W (STAPLE) IMPLANT
STRIP CLOSURE SKIN 1/2X4 (GAUZE/BANDAGES/DRESSINGS) IMPLANT
SUT ETHILON 4 0 PS 2 18 (SUTURE) IMPLANT
SUT MNCRL AB 4-0 PS2 18 (SUTURE) ×1 IMPLANT
SUT SILK 2 0 SH (SUTURE) ×2 IMPLANT
SUT VIC AB 2-0 SH 27 (SUTURE)
SUT VIC AB 2-0 SH 27XBRD (SUTURE) IMPLANT
SUT VIC AB 3-0 FS2 27 (SUTURE) IMPLANT
SUT VIC AB 4-0 P-3 18XBRD (SUTURE) IMPLANT
SUT VIC AB 4-0 P3 18 (SUTURE)
SUT VICRYL 3-0 CR8 SH (SUTURE) ×2 IMPLANT
SUT VICRYL 4-0 PS2 18IN ABS (SUTURE) IMPLANT
SUT VICRYL AB 3 0 TIES (SUTURE) ×1 IMPLANT
SYR 10ML LL (SYRINGE) ×2 IMPLANT
SYR BULB 3OZ (MISCELLANEOUS) IMPLANT
TAPE HYPAFIX 4 X10 (GAUZE/BANDAGES/DRESSINGS) IMPLANT
TOWEL GREEN STERILE FF (TOWEL DISPOSABLE) ×2 IMPLANT
TUBE CONNECTING 20X1/4 (TUBING) ×2 IMPLANT
YANKAUER SUCT BULB TIP NO VENT (SUCTIONS) ×2 IMPLANT

## 2018-12-22 NOTE — Anesthesia Postprocedure Evaluation (Signed)
Anesthesia Post Note  Patient: Leah Gould  Procedure(s) Performed: EXCISION DEEP LEFT INGUINAL LYMPH NODE (Left Groin)     Patient location during evaluation: PACU Anesthesia Type: General Level of consciousness: awake and alert Pain management: pain level controlled Vital Signs Assessment: post-procedure vital signs reviewed and stable Respiratory status: spontaneous breathing, nonlabored ventilation, respiratory function stable and patient connected to nasal cannula oxygen Cardiovascular status: blood pressure returned to baseline and stable Postop Assessment: no apparent nausea or vomiting Anesthetic complications: no    Last Vitals:  Vitals:   12/22/18 1315 12/22/18 1316  BP: 111/80 111/80  Pulse: 64 66  Resp:  16  Temp:  36.7 C  SpO2: 100% 100%    Last Pain:  Vitals:   12/22/18 1345  TempSrc:   PainSc: Asleep                 Montez Hageman

## 2018-12-22 NOTE — Op Note (Signed)
Patient Name:           Leah Gould. Pupo   Date of Surgery:        12/22/2018  Pre op Diagnosis:      Left inguinal adenopathy  Post op Diagnosis:    Left inguinal adenopathy  Procedure:                 Excision 5 cm deep left inguinal lymph node  Surgeon:                     Edsel Petrin. Dalbert Batman, M.D., FACS  Assistant:                      OR staff  Operative Indications:      This is a healthy, pleasant, 23 year old female, referred by Verl Blalock, PA at [facility] for evaluation of left inguinal adenopathy. She has been seen at [place].      She states that she had a left groin mass for about 10 years but it has significantly enlarged over the last year. Not really painful. Never infected. No other enlarged lymph nodes. She denies fevers or night sweats. Denies weight loss. Has regular menses.       She said that she also felt some tender lumpiness in the central lateral right breast which comes and goes.      On Nov 17, 2018 she had CT scan of abdomen and pelvis which showed a 5 cm left inguinal lymph node. Spleen looked normal. Slight bladder wall thickening suggesting cystitis. Possible recently ruptured ovarian cyst. On Nov 20, 2018 she underwent ultrasound-guided core biopsy. The path showed atypical lymphocytes highly suspicious for lymphoma.  lab work shows hemoglobin 9.1. White count 3900. Platelet count 226,000. No cancer syndromes in the family. No lymphoma or leukemia and the family.       Exam reveals a 4-5 cm left inguinal lymph node. No adenopathy elsewhere. No skin lesions.      She will be scheduled for excision of deep left inguinal lymph node under general anesthesia as outpatient.  Operative Findings:       There was a pathologically enlarged lymph node in the left inguinal area.  This was inferior and deep to the left inguinal ligament.  There were numerous tiny vascular channels which were either cauterized, ligated with Vicryl ties, or  controlled with metal clips.  The lymph node was removed intact and sent fresh to the lab for lymphoma work-up.  The patient was very thin and had almost no subcutaneous tissue.  Procedure in Detail:          Following the induction of general LMA anesthesia the patient's lower abdomen genitalia and groins were prepped and draped in a sterile fashion.  Surgical timeout was performed.  Intravenous antibiotics were given.  0.5% Marcaine with epinephrine was used as a local infiltration anesthetic.  A transverse, slightly oblique incision was made overlying the left inguinal mass.  Dissection was carried down through the subcutaneous tissue.  Scarpa's fascia was incised.  Self-retaining retractors were placed.  I slowly dissected the large lymph nodes circumferentially controlling vascular and lymphatic channels as I went.  Ultimately the lymph node was removed intact and sent fresh to the lab for immediate processing.  Hemostasis was excellent.  The wound was irrigated.  The deeper tissues and Scarpa's fascia was closed with 3-0 Vicryl sutures being careful to close down the dead space.  The skin was closed with a running subcuticular 4-0 Monocryl and Dermabond.  She tolerated the procedure well and was taken to PACU in stable condition.  EBL 10 to 15 cc.  Counts correct.  Complications none.    Addendum: I logged onto the PMP aware website and reviewed her prescription medication history     Derick Seminara M. Dalbert Batman, M.D., FACS General and Minimally Invasive Surgery Breast and Colorectal Surgery  12/22/2018 11:31 AM

## 2018-12-22 NOTE — Anesthesia Preprocedure Evaluation (Signed)
Anesthesia Evaluation  Patient identified by MRN, date of birth, ID band Patient awake    Reviewed: Allergy & Precautions, NPO status , Patient's Chart, lab work & pertinent test results  Airway Mallampati: II  TM Distance: >3 FB Neck ROM: Full    Dental no notable dental hx.    Pulmonary neg pulmonary ROS,    Pulmonary exam normal breath sounds clear to auscultation       Cardiovascular negative cardio ROS Normal cardiovascular exam Rhythm:Regular Rate:Normal     Neuro/Psych negative neurological ROS  negative psych ROS   GI/Hepatic negative GI ROS, Neg liver ROS,   Endo/Other  negative endocrine ROS  Renal/GU negative Renal ROS  negative genitourinary   Musculoskeletal negative musculoskeletal ROS (+)   Abdominal   Peds negative pediatric ROS (+)  Hematology negative hematology ROS (+)   Anesthesia Other Findings   Reproductive/Obstetrics negative OB ROS                             Anesthesia Physical Anesthesia Plan  ASA: I  Anesthesia Plan: General   Post-op Pain Management:    Induction: Intravenous  PONV Risk Score and Plan: 3 and Treatment may vary due to age or medical condition  Airway Management Planned: LMA  Additional Equipment:   Intra-op Plan:   Post-operative Plan:   Informed Consent: I have reviewed the patients History and Physical, chart, labs and discussed the procedure including the risks, benefits and alternatives for the proposed anesthesia with the patient or authorized representative who has indicated his/her understanding and acceptance.     Dental advisory given  Plan Discussed with: CRNA  Anesthesia Plan Comments:         Anesthesia Quick Evaluation

## 2018-12-22 NOTE — Anesthesia Procedure Notes (Signed)
Procedure Name: LMA Insertion Date/Time: 12/22/2018 10:51 AM Performed by: Myna Bright, CRNA Pre-anesthesia Checklist: Patient identified, Emergency Drugs available, Suction available and Patient being monitored Patient Re-evaluated:Patient Re-evaluated prior to induction Oxygen Delivery Method: Circle system utilized Preoxygenation: Pre-oxygenation with 100% oxygen Induction Type: IV induction Ventilation: Mask ventilation without difficulty LMA: LMA inserted LMA Size: 4.0 Tube type: Oral Number of attempts: 1 Placement Confirmation: positive ETCO2 and breath sounds checked- equal and bilateral Tube secured with: Tape Dental Injury: Teeth and Oropharynx as per pre-operative assessment

## 2018-12-22 NOTE — Transfer of Care (Signed)
Immediate Anesthesia Transfer of Care Note  Patient: Leah Gould  Procedure(s) Performed: EXCISION DEEP LEFT INGUINAL LYMPH NODE (Left Groin)  Patient Location: PACU  Anesthesia Type:General  Level of Consciousness: sedated and responds to stimulation  Airway & Oxygen Therapy: Patient Spontanous Breathing and Patient connected to nasal cannula oxygen  Post-op Assessment: Report given to RN and Post -op Vital signs reviewed and stable  Post vital signs: Reviewed and stable  Last Vitals:  Vitals Value Taken Time  BP 102/71 12/22/18 1135  Temp    Pulse 64 12/22/18 1136  Resp 10 12/22/18 1136  SpO2 100 % 12/22/18 1136  Vitals shown include unvalidated device data.  Last Pain:  Vitals:   12/22/18 0927  TempSrc: Oral  PainSc: 6       Patients Stated Pain Goal: 8 (72/76/18 4859)  Complications: No apparent anesthesia complications

## 2018-12-22 NOTE — Discharge Instructions (Signed)
NO TYLENOL BEFORE 3:30 PM TODAY !   Ice pack to left groin for 10 minutes at a time, intermittently, for 24 hours, while awake  Stay inside the house for 24 hours You may walk around the block tomorrow You may drive your car in 2 or 3 days No sports or strenuous activities for about 3 weeks  The skin is closed with a clear plastic superglue which will wear off in about 3 weeks  The pathology report should be out by Friday.  We will call you the pathology report as soon as we get it. Call us if you have not heard the pathology results by noon on Friday          Managing Your Pain After Surgery Without Opioids    Thank you for participating in our program to help patients manage their pain after surgery without opioids. This is part of our effort to provide you with the best care possible, without exposing you or your family to the risk that opioids pose.  What pain can I expect after surgery? You can expect to have some pain after surgery. This is normal. The pain is typically worse the day after surgery, and quickly begins to get better. Many studies have found that many patients are able to manage their pain after surgery with Over-the-Counter (OTC) medications such as Tylenol and Motrin. If you have a condition that does not allow you to take Tylenol or Motrin, notify your surgical team.  How will I manage my pain? The best strategy for controlling your pain after surgery is around the clock pain control with Tylenol (acetaminophen) and Motrin (ibuprofen or Advil). Alternating these medications with each other allows you to maximize your pain control. In addition to Tylenol and Motrin, you can use heating pads or ice packs on your incisions to help reduce your pain.  How will I alternate your regular strength over-the-counter pain medication? You will take a dose of pain medication every three hours. ; Start by taking 650 mg of Tylenol (2 pills of 325 mg) ; 3  hours later take 600 mg of Motrin (3 pills of 200 mg) ; 3 hours after taking the Motrin take 650 mg of Tylenol ; 3 hours after that take 600 mg of Motrin.   - 1 -  See example - if your first dose of Tylenol is at 12:00 PM   12:00 PM Tylenol 650 mg (2 pills of 325 mg)  3:00 PM Motrin 600 mg (3 pills of 200 mg)  6:00 PM Tylenol 650 mg (2 pills of 325 mg)  9:00 PM Motrin 600 mg (3 pills of 200 mg)  Continue alternating every 3 hours   We recommend that you follow this schedule around-the-clock for at least 3 days after surgery, or until you feel that it is no longer needed. Use the table on the last page of this handout to keep track of the medications you are taking. Important: Do not take more than 3000mg  of Tylenol or 3200mg  of Motrin in a 24-hour period. Do not take ibuprofen/Motrin if you have a history of bleeding stomach ulcers, severe kidney disease, &/or actively taking a blood thinner  What if I still have pain? If you have pain that is not controlled with the over-the-counter pain medications (Tylenol and Motrin or Advil) you might have what we call breakthrough pain. You will receive a prescription for a small amount of an opioid pain medication such as Oxycodone, Tramadol, or  Tylenol with Codeine. Use these opioid pills in the first 24 hours after surgery if you have breakthrough pain. Do not take more than 1 pill every 4-6 hours.  If you still have uncontrolled pain after using all opioid pills, don't hesitate to call our staff using the number provided. We will help make sure you are managing your pain in the best way possible, and if necessary, we can provide a prescription for additional pain medication.   Day 1    Time  Name of Medication Number of pills taken  Amount of Acetaminophen  Pain Level   Comments  AM PM       AM PM       AM PM       AM PM       AM PM       AM PM       AM PM       AM PM       Total Daily amount of Acetaminophen Do not take more  than  3,000 mg per day      Day 2    Time  Name of Medication Number of pills taken  Amount of Acetaminophen  Pain Level   Comments  AM PM       AM PM       AM PM       AM PM       AM PM       AM PM       AM PM       AM PM       Total Daily amount of Acetaminophen Do not take more than  3,000 mg per day      Day 3    Time  Name of Medication Number of pills taken  Amount of Acetaminophen  Pain Level   Comments  AM PM       AM PM       AM PM       AM PM          AM PM       AM PM       AM PM       AM PM       Total Daily amount of Acetaminophen Do not take more than  3,000 mg per day      Day 4    Time  Name of Medication Number of pills taken  Amount of Acetaminophen  Pain Level   Comments  AM PM       AM PM       AM PM       AM PM       AM PM       AM PM       AM PM       AM PM       Total Daily amount of Acetaminophen Do not take more than  3,000 mg per day      Day 5    Time  Name of Medication Number of pills taken  Amount of Acetaminophen  Pain Level   Comments  AM PM       AM PM       AM PM       AM PM       AM PM       AM PM       AM PM  AM PM       Total Daily amount of Acetaminophen Do not take more than  3,000 mg per day       Day 6    Time  Name of Medication Number of pills taken  Amount of Acetaminophen  Pain Level  Comments  AM PM       AM PM       AM PM       AM PM       AM PM       AM PM       AM PM       AM PM       Total Daily amount of Acetaminophen Do not take more than  3,000 mg per day      Day 7    Time  Name of Medication Number of pills taken  Amount of Acetaminophen  Pain Level   Comments  AM PM       AM PM       AM PM       AM PM       AM PM       AM PM       AM PM       AM PM       Total Daily amount of Acetaminophen Do not take more than  3,000 mg per day        For additional information about how and where to safely dispose of unused  opioid medications - [employer]  Disclaimer: This document contains information and/or instructional materials adapted from [place] for the typical patient with your condition. It does not replace medical advice from your health care provider because your experience may differ from that of the typical patient. Talk to your health care provider if you have any questions about this document, your condition or your treatment plan. Adapted from [place] Instructions  Activity: Get plenty of rest for the remainder of the day. A responsible individual must stay with you for 24 hours following the procedure.  For the next 24 hours, DO NOT: -Drive a car -Paediatric nurse -Drink alcoholic beverages -Take any medication unless instructed by your physician -Make any legal decisions or sign important papers.  Meals: Start with liquid foods such as gelatin or soup. Progress to regular foods as tolerated. Avoid greasy, spicy, heavy foods. If nausea and/or vomiting occur, drink only clear liquids until the nausea and/or vomiting subsides. Call your physician if vomiting continues.  Special Instructions/Symptoms: Your throat may feel dry or sore from the anesthesia or the breathing tube placed in your throat during surgery. If this causes discomfort, gargle with warm salt water. The discomfort should disappear within 24 hours.  If you had a scopolamine patch placed behind your ear for the management of post- operative nausea and/or vomiting:  1. The medication in the patch is effective for 72 hours, after which it should be removed.  Wrap patch in a tissue and discard in the trash. Wash hands thoroughly with soap and water. 2. You may remove the patch earlier than 72 hours if you experience unpleasant side effects which may include dry mouth, dizziness or visual disturbances. 3. Avoid touching the patch. Wash your hands with soap  and water after contact with the patch.

## 2018-12-22 NOTE — Interval H&P Note (Signed)
History and Physical Interval Note:  12/22/2018 9:09 AM  Leah Gould. [name]  has presented today for surgery, with the diagnosis of LEFT INGUINAL LYMPH NODE MASS INGUINAL ADENOPATHY.  The various methods of treatment have been discussed with the patient and family. After consideration of risks, benefits and other options for treatment, the patient has consented to  Procedure(s): EXCISION DEEP LEFT INGUINAL LYMPH NODE (Left) as a surgical intervention.  The patient's history has been reviewed, patient examined, no change in status, stable for surgery.  I have reviewed the patient's chart and labs.  Questions were answered to the patient's satisfaction.     [name]

## 2018-12-23 ENCOUNTER — Encounter (HOSPITAL_BASED_OUTPATIENT_CLINIC_OR_DEPARTMENT_OTHER): Payer: Self-pay | Admitting: General Surgery

## 2018-12-30 ENCOUNTER — Other Ambulatory Visit: Payer: Self-pay | Admitting: Hematology

## 2019-01-04 NOTE — Progress Notes (Signed)
HEMATOLOGY/ONCOLOGY CONSULTATION NOTE  Date of Service: 01/05/2019  Patient Care Team: Patient, No Pcp Per as PCP - General (General Practice)  CHIEF COMPLAINTS/PURPOSE OF CONSULTATION:  Newly Diagnosed Lymphoproliferative Disorder  HISTORY OF PRESENTING ILLNESS:   Leah Gould is a wonderful 23 y.o. female who has been referred to [place] by Dr. Fanny Skates for evaluation and management of her newly diagnosed Lymphoproliferative disorder. The pt reports that she is doing well overall. She is accompanied today by her Mom, via [employer].  The pt notes that she first noticed her enlarged left inguinal lymph node in 2010, and notes that it was "really tiny." She presented to an urgent care initially at the time. She is unsure if the lymph node reduced in size.  The pt notes that she noticed her left inguinal lymph node being larger and more sensitive last summer 2019 after a dog jumped on her. She notes that after this, the lymph node continued growing in size. She notes that the lymph node began causing pain in February 2020. She notes that this lymph node would increase in size in correlation with her periods. The pt notes that she made an appointment to see Ellen Henri, [facility] at [facility], who referred her to surgeon Dr. Dalbert Batman.   The pt denies noticing any other new lumps or bumps and denies fevers, chills, night sweats or unexpected weight loss. The pt notes that she has had some new SOB, which she notes improves after eating ice, and has occurred more frequently recently. She endorses ice picca symptoms. She feels that she has to take deep breaths at night when she "settles down."  She does continue to feel a sense of swelling in her left groin.  The pt also endorses some discomfort in her right, lower abdomen. She was seen to have a thickened bladder and right sided ovarian cyst on the 11/17/18 CT A/P, as noted below. She denies a history of kidney stones or UTIs. She also notes  that she has felt some discomfort since her miscarriage in late April.  The pt denies using any steroids recently. The pt reports that she has had stable asthma and uses an inhaler as needed, not used frequently, endorses seasonal correlation. The pt also notes that she has been anemic since 2010, thought to be related to iron deficiency and endorses heavier periods. She notes that her periods can last up to 7-8 days which is her baseline. She has been taking PO Iron replacement for the last two months, denies other previous iron replacement. She has never had a blood transfusion.   The pt notes that she was pregnant once before and had a miscarriage about two months ago. She notes this occurred around 8-[redacted] weeks gestation, not followed by D&C.   The pt denies past surgeries and denies any medication allergies. She is not taking any chronic medications.  Of note prior to the patient's visit today, pt has had a CT A/P completed on 11/17/18 with results revealing "Enlarged presumed lymph node in the left inguinal region measuring 5.2 x 3.9 x 3.8 cm. There is enhancement of this lymph node. Etiology for this localized lymph node prominence is uncertain. Tissue sampling may well be warranted given this finding. No other adenopathy noted in the abdomen or pelvis. 2.  Spleen does not appear appreciably enlarged. 3. Wall thickening in the urinary bladder. Suspect a degree of cystitis. No renal or ureteral calculus. No hydronephrosis. 4. Apparent recent ovarian cyst  rupture on the right with mild fluid tracking from the right adnexa and cul-de-sac. 5. No evident bowel obstruction or bowel wall thickening. No abscess in the abdomen pelvis. No periappendiceal region inflammatory change."  Most recent lab results (623/20) of CBC w/diff and CMP is as follows: all values are WNL except for WBC at 3.5k, HGB at 9.4, HCT at 32.5, MCV at 65.7, MCH at 19.0, MCHC at 28.9, RDW at 21.3, ANC at 1.5k, Potassium at 3.3.  On  review of systems, pt reports some SOB, discomfort in right lower abdomen, left inguinal swelling, and denies fevers, chills, night sweats, unexpected weight loss, noticing any other lumps or bumps, mouth soreness, leg swelling, skin rashes, itching, and any other symptoms.   On PMHx the pt reports asthma and iron deficiency anemia. One pregnancy with miscarriage at 8-9 weeks. On Social Hx the pt denies smoking cigarettes and denies alcohol consumption. She works at [employer] as a Scientist, water quality. On Family Hx the pt denies blood disorders nor cancers.   MEDICAL HISTORY:  Past Medical History:  Diagnosis Date   Anemia    Asthma    no meds, allergy induced   Inguinal adenopathy 12/22/2018    SURGICAL HISTORY: Past Surgical History:  Procedure Laterality Date   MASS EXCISION Left 12/22/2018   Procedure: EXCISION DEEP LEFT INGUINAL LYMPH NODE;  Surgeon: Fanny Skates, MD;  Location: [facility];  Service: General;  Laterality: Left;    SOCIAL HISTORY: Social History   Socioeconomic History   Marital status: Single    Spouse name: Not on file   Number of children: Not on file   Years of education: Not on file   Highest education level: Not on file  Occupational History   Not on file  Social Needs   Financial resource strain: Not on file   Food insecurity    Worry: Not on file    Inability: Not on file   Transportation needs    Medical: Not on file    Non-medical: Not on file  Tobacco Use   Smoking status: Never Smoker   Smokeless tobacco: Never Used  Substance and Sexual Activity   Alcohol use: No   Drug use: No   Sexual activity: Yes    Birth control/protection: None  Lifestyle   Physical activity    Days per week: Not on file    Minutes per session: Not on file   Stress: Not on file  Relationships   Social connections    Talks on phone: Not on file    Gets together: Not on file    Attends religious service: Not on file    Active member  of club or organization: Not on file    Attends meetings of clubs or organizations: Not on file    Relationship status: Not on file   Intimate partner violence    Fear of current or ex partner: Not on file    Emotionally abused: Not on file    Physically abused: Not on file    Forced sexual activity: Not on file  Other Topics Concern   Not on file  Social History Narrative   Not on file    FAMILY HISTORY: No family history on file.  ALLERGIES:  is allergic to fruit & vegetable daily [nutritional supplements] and peanut-containing drug products.  MEDICATIONS:  Current Outpatient Medications  Medication Sig Dispense Refill   ferrous sulfate 325 (65 FE) MG EC tablet Take 325 mg by mouth 3 (three)  times daily with meals.     HYDROcodone-acetaminophen (NORCO) 5-325 MG tablet Take 1-2 tablets by mouth every 6 (six) hours as needed for moderate pain or severe pain. 20 tablet 0   No current facility-administered medications for this visit.     REVIEW OF SYSTEMS:    10 Point review of Systems was done is negative except as noted above.  PHYSICAL EXAMINATION: ECOG PERFORMANCE STATUS: 0 - Asymptomatic  . Vitals:   01/05/19 0857  BP: 105/76  Pulse: 80  Resp: 18  Temp: 98.7 F (37.1 C)  SpO2: 100%   Filed Weights   01/05/19 0857  Weight: 100 lb 3.2 oz (45.5 kg)   .Body mass index is 17.75 kg/m.  GENERAL:alert, in no acute distress and comfortable SKIN: no acute rashes, no significant lesions EYES: conjunctiva are pink and non-injected, sclera anicteric OROPHARYNX: MMM, no exudates, no oropharyngeal erythema or ulceration NECK: supple, no JVD LYMPH: palpable left inguinal lymph node 3-4 cm. no palpable lymphadenopathy in the cervical or axillary regions LUNGS: clear to auscultation b/l with normal respiratory effort HEART: regular rate & rhythm ABDOMEN:  normoactive bowel sounds , non tender, not distended. Mild tenderness to deep palpation. No palpable  hepatosplenomegaly. Extremity: no pedal edema PSYCH: alert & oriented x 3 with fluent speech NEURO: no focal motor/sensory deficits  LABORATORY DATA:  I have reviewed the data as listed  . CBC Latest Ref Rng & Units 12/22/2018 11/20/2018 10/07/2018  WBC 4.0 - 10.5 K/uL 3.5(L) 3.9(L) 6.6  Hemoglobin 12.0 - 15.0 g/dL 9.4(L) 9.1(L) 8.1(L)  Hematocrit 36.0 - 46.0 % 32.5(L) 31.8(L) 29.2(L)  Platelets 150 - 400 K/uL 308 226 341    . CMP Latest Ref Rng & Units 12/22/2018  Glucose 70 - 99 mg/dL 93  BUN 6 - 20 mg/dL 11  Creatinine 0.44 - 1.00 mg/dL 0.83  Sodium 135 - 145 mmol/L 138  Potassium 3.5 - 5.1 mmol/L 3.3(L)  Chloride 98 - 111 mmol/L 104  CO2 22 - 32 mmol/L 24  Calcium 8.9 - 10.3 mg/dL 9.6  Total Protein 6.5 - 8.1 g/dL 7.5  Total Bilirubin 0.3 - 1.2 mg/dL 0.4  Alkaline Phos 38 - 126 U/L 49  AST 15 - 41 U/L 29  ALT 0 - 44 U/L 18    12/22/18 Left Inguinal LN Biopsy:     RADIOGRAPHIC STUDIES: I have personally reviewed the radiological images as listed and agreed with the findings in the report. No results found.  ASSESSMENT & PLAN:  23 y.o. female with  1. 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 Iron deficiency anemia  PLAN: -Discussed patient's most recent labs from 12/22/18, HGB at 9.4 with MCV at 65.7, WBC borderline low, PLT normal. -Discussed the 12/22/18 Inguinal lymph node biopsy which revealed a process most concerning for either a lymphocytic predominant Hodgkin's lymphoma vs T-cell/histocyte rich diffuse large b-cell lymphoma. The sample was sent for a second opinion and is awaiting further characterization for confirmation. -Discussed the 11/17/18 CT A/P which revealed "Enlarged presumed lymph node in the left inguinal region measuring 5.2 x 3.9 x 3.8 cm. There is enhancement of this lymph node. Etiology for this localized lymph node prominence is uncertain.  Tissue sampling may well be warranted given this finding. No other adenopathy noted in the abdomen or pelvis. 2.  Spleen does not appear appreciably enlarged. 3. Wall thickening in the urinary bladder. Suspect a degree of cystitis. No renal or ureteral calculus.  No hydronephrosis. 4. Apparent recent ovarian cyst rupture on the right with mild fluid tracking from the right adnexa and cul-de-sac. 5. No evident bowel obstruction or bowel wall thickening. No abscess in the abdomen pelvis. No periappendiceal region inflammatory change." -Discussed that given the patient's presentation, age and history, Hodgkin's Lymphoma is the primary consideration in her differential. Awaiting second pathologist opinion and will discuss this patient's case with my tumor board. -Discussed that we will also optimize her iron deficiency as anemia will have a bearing on treatment. Recommend IV Injectafer and will set the pt up for this.. -Discussed that treatment recommendations will likely require a port placement. Will coordinate port placement with her surgeon Dr. Dalbert Batman -Discussed that regardless of staging, the intent of treatment is curative -Discussed that there is a low, but known risk of infertility as a result of treatment and discussed the options of Lupron injections vs egg preservation. Discussed that [facility] is an option for further discussion and counseling, and discussed the time sensitivity for this consideration prior to initiating treatment. Provided contact information to [facility]. -Provided supplemental information -Provided contact information to Education officer, museum as well  -Will order blood tests today and urine study to rule out UTI -Will order PET/CT for further characterization, staging, and treatment planning -Will order ECHO for baseline, and treatment planning -Will order PFT for treatment planning as well -Will see the pt back in 2 weeks    Labs today IV Injectafer weekly x 2  doses PET/CT in 1 week Port a cath with Dr Dalbert Batman in 1 week ECHO in 3-4 days PFTs in 3-4 weeks Patient to call fertility clinic given information RTC with dr Irene Limbo in 2 weeks   All of the patients questions were answered with apparent satisfaction. The patient knows to call the clinic with any problems, questions or concerns.  The total time spent in the appt was 60 minutes and more than 50% was on counseling and direct patient cares.    Sullivan Lone MD MS AAHIVMS [facility] Hematology/Oncology Physician [facility]  (Office):       [phone] (Work cell):  [phone] (Fax):           [phone]  01/05/2019 10:16 AM   I, Baldwin [place], am acting as a scribe for Dr. Sullivan Lone.   .I have reviewed the above documentation for accuracy and completeness, and I agree with the above. Brunetta Genera MD

## 2019-01-05 ENCOUNTER — Inpatient Hospital Stay: Payer: 59 | Attending: Hematology | Admitting: Hematology

## 2019-01-05 ENCOUNTER — Telehealth: Payer: Self-pay | Admitting: Hematology

## 2019-01-05 ENCOUNTER — Other Ambulatory Visit: Payer: Self-pay

## 2019-01-05 ENCOUNTER — Inpatient Hospital Stay: Payer: 59

## 2019-01-05 VITALS — BP 105/76 | HR 80 | Temp 98.7°F | Resp 18 | Ht 63.0 in | Wt 100.2 lb

## 2019-01-05 DIAGNOSIS — D479 Neoplasm of uncertain behavior of lymphoid, hematopoietic and related tissue, unspecified: Secondary | ICD-10-CM

## 2019-01-05 DIAGNOSIS — D509 Iron deficiency anemia, unspecified: Secondary | ICD-10-CM | POA: Diagnosis not present

## 2019-01-05 DIAGNOSIS — C8190 Hodgkin lymphoma, unspecified, unspecified site: Secondary | ICD-10-CM

## 2019-01-05 DIAGNOSIS — D500 Iron deficiency anemia secondary to blood loss (chronic): Secondary | ICD-10-CM

## 2019-01-05 LAB — CBC WITH DIFFERENTIAL/PLATELET
Abs Immature Granulocytes: 0.01 10*3/uL (ref 0.00–0.07)
Basophils Absolute: 0.1 10*3/uL (ref 0.0–0.1)
Basophils Relative: 1 %
Eosinophils Absolute: 0.1 10*3/uL (ref 0.0–0.5)
Eosinophils Relative: 2 %
HCT: 31.1 % — ABNORMAL LOW (ref 36.0–46.0)
Hemoglobin: 8.9 g/dL — ABNORMAL LOW (ref 12.0–15.0)
Immature Granulocytes: 0 %
Lymphocytes Relative: 28 %
Lymphs Abs: 1.1 10*3/uL (ref 0.7–4.0)
MCH: 18.9 pg — ABNORMAL LOW (ref 26.0–34.0)
MCHC: 28.6 g/dL — ABNORMAL LOW (ref 30.0–36.0)
MCV: 65.9 fL — ABNORMAL LOW (ref 80.0–100.0)
Monocytes Absolute: 0.4 10*3/uL (ref 0.1–1.0)
Monocytes Relative: 10 %
Neutro Abs: 2.3 10*3/uL (ref 1.7–7.7)
Neutrophils Relative %: 59 %
Platelets: 270 10*3/uL (ref 150–400)
RBC: 4.72 MIL/uL (ref 3.87–5.11)
RDW: 20 % — ABNORMAL HIGH (ref 11.5–15.5)
WBC: 3.9 10*3/uL — ABNORMAL LOW (ref 4.0–10.5)
nRBC: 0 % (ref 0.0–0.2)

## 2019-01-05 LAB — CMP (CANCER CENTER ONLY)
ALT: 12 U/L (ref 0–44)
AST: 21 U/L (ref 15–41)
Albumin: 4.5 g/dL (ref 3.5–5.0)
Alkaline Phosphatase: 49 U/L (ref 38–126)
Anion gap: 8 (ref 5–15)
BUN: 7 mg/dL (ref 6–20)
CO2: 24 mmol/L (ref 22–32)
Calcium: 9.5 mg/dL (ref 8.9–10.3)
Chloride: 106 mmol/L (ref 98–111)
Creatinine: 0.79 mg/dL (ref 0.44–1.00)
GFR, Est AFR Am: >60 mL/min (ref >60)
GFR, Estimated: >60 mL/min (ref >60)
Glucose, Bld: 93 mg/dL (ref 70–99)
Potassium: 3.8 mmol/L (ref 3.5–5.1)
Sodium: 138 mmol/L (ref 135–145)
Total Bilirubin: 0.4 mg/dL (ref 0.3–1.2)
Total Protein: 7.8 g/dL (ref 6.5–8.1)

## 2019-01-05 LAB — IRON AND TIBC
Iron: 13 ug/dL — ABNORMAL LOW (ref 41–142)
Saturation Ratios: 3 % — ABNORMAL LOW (ref 21–57)
TIBC: 448 ug/dL — ABNORMAL HIGH (ref 236–444)
UIBC: 435 ug/dL — ABNORMAL HIGH (ref 120–384)

## 2019-01-05 LAB — SEDIMENTATION RATE: Sed Rate: 4 mm/hr (ref 0–22)

## 2019-01-05 LAB — FERRITIN: Ferritin: <4 ng/mL — ABNORMAL LOW (ref 11–307)

## 2019-01-05 LAB — SAMPLE TO BLOOD BANK

## 2019-01-05 LAB — LACTATE DEHYDROGENASE: LDH: 138 U/L (ref 98–192)

## 2019-01-05 NOTE — Patient Instructions (Signed)
Thank you for choosing [facility] to provide your oncology and hematology care.   Should you have questions after your visit to the [facility] ([facility]), please contact this office at [phone] between 8:30 AM and 4:30 PM. Voicemails left after 4:00 PM may not be returned until the following business day. Calls received after 4:30 PM will be answered by an off-site Nurse Triage Line.    Prescription Refills:  Please have your pharmacy contact us directly for most prescription requests.  Contact the office directly for refills of narcotics (pain medications). Allow 48-72 hours for refills.  Appointments: Please contact the [facility] scheduling department [phone] for questions regarding [facility] appointment scheduling.  Contact the schedulers with any scheduling changes so that your appointment can be rescheduled in a timely manner.   Central Scheduling for [place] [phone] - Call to schedule procedures such as PET scans, CT scans, MRI, Ultrasound, etc.  To afford each patient quality time with our providers, please arrive 30 minutes before your scheduled appointment time.  If you arrive late for your appointment, you may be asked to reschedule.  We strive to give you quality time with our providers, and arriving late affects you and other patients whose appointments are after yours. If you are a no show for multiple scheduled visits, you may be dismissed from the clinic at the providers discretion.     Resources: [facility] Social Workers [phone] for additional information on assistance programs --Anne Cunningham/Abigail Elmore  [place] DSS  [phone]: Information regarding food stamps, Medicaid, and utility assistance [place] [phone]   [place] [employer]'s shared-ride transportation service for eligible riders who have a disability that prevents them from riding the fixed route bus.   Medicare Rights Center 800-333-4114 Helps people with  Medicare understand their rights and benefits, navigate the Medicare system, and secure the quality healthcare they deserve [employer] 800-227-2345 Assists patients locate various types of support and financial assistance Cancer Care: 1-800-813-HOPE (4673) Provides financial assistance, online support groups, medication/co-pay assistance.      

## 2019-01-05 NOTE — Telephone Encounter (Signed)
Scheduled appt per 7/7 los. Printed calendar and avs.

## 2019-01-06 LAB — HEPATITIS B CORE ANTIBODY, TOTAL: Hep B Core Total Ab: NEGATIVE

## 2019-01-06 LAB — HEPATITIS B SURFACE ANTIGEN: Hepatitis B Surface Ag: NEGATIVE

## 2019-01-06 LAB — HEPATITIS C ANTIBODY: HCV Ab: <0.1 s/co ratio (ref 0.0–0.9)

## 2019-01-07 ENCOUNTER — Other Ambulatory Visit: Payer: Self-pay

## 2019-01-07 ENCOUNTER — Ambulatory Visit (HOSPITAL_COMMUNITY)
Admission: RE | Admit: 2019-01-07 | Discharge: 2019-01-07 | Disposition: A | Discharge status: Home / Self Care | Payer: 59 | Source: Ambulatory Visit | Attending: Hematology | Admitting: Hematology

## 2019-01-07 ENCOUNTER — Other Ambulatory Visit (HOSPITAL_COMMUNITY)
Admission: RE | Admit: 2019-01-07 | Discharge: 2019-01-07 | Disposition: A | Discharge status: Home / Self Care | Payer: 59 | Source: Ambulatory Visit | Attending: Hematology | Admitting: Hematology

## 2019-01-07 DIAGNOSIS — Z1159 Encounter for screening for other viral diseases: Secondary | ICD-10-CM | POA: Diagnosis not present

## 2019-01-07 DIAGNOSIS — C8190 Hodgkin lymphoma, unspecified, unspecified site: Secondary | ICD-10-CM

## 2019-01-07 NOTE — Progress Notes (Signed)
  Echocardiogram 2D Echocardiogram has been performed.  Leah Gould 01/07/2019, 2:24 PM

## 2019-01-08 ENCOUNTER — Other Ambulatory Visit: Payer: Self-pay | Admitting: Hematology

## 2019-01-08 ENCOUNTER — Ambulatory Visit (HOSPITAL_COMMUNITY): Payer: 59

## 2019-01-08 ENCOUNTER — Other Ambulatory Visit: Payer: Self-pay | Admitting: Certified Nurse Midwife

## 2019-01-08 LAB — SARS CORONAVIRUS 2 (TAT 6-24 HRS): SARS Coronavirus 2: NEGATIVE

## 2019-01-11 ENCOUNTER — Ambulatory Visit (HOSPITAL_COMMUNITY)
Admission: RE | Admit: 2019-01-11 | Discharge: 2019-01-11 | Disposition: A | Discharge status: Home / Self Care | Payer: 59 | Source: Ambulatory Visit | Attending: Hematology | Admitting: Hematology

## 2019-01-11 ENCOUNTER — Other Ambulatory Visit: Payer: Self-pay

## 2019-01-11 DIAGNOSIS — C8190 Hodgkin lymphoma, unspecified, unspecified site: Secondary | ICD-10-CM

## 2019-01-11 LAB — PULMONARY FUNCTION TEST
DL/VA % pred: 100 %
DL/VA: 4.81 ml/min/mmHg/L
DLCO cor % pred: 90 %
DLCO cor: 19.3 ml/min/mmHg
DLCO unc % pred: 74 %
DLCO unc: 15.97 ml/min/mmHg
FEF 25-75 Post: 1.25 L/sec
FEF 25-75 Pre: 3.34 L/sec
FEF2575-%Change-Post: -62 %
FEF2575-%Pred-Post: 35 %
FEF2575-%Pred-Pre: 95 %
FEV1-%Change-Post: -26 %
FEV1-%Pred-Post: 71 %
FEV1-%Pred-Pre: 97 %
FEV1-Post: 2.01 L
FEV1-Pre: 2.73 L
FEV1FVC-%Change-Post: -16 %
FEV1FVC-%Pred-Pre: 105 %
FEV6-%Change-Post: -12 %
FEV6-%Pred-Post: 81 %
FEV6-%Pred-Pre: 93 %
FEV6-Post: 2.59 L
FEV6-Pre: 2.95 L
FEV6FVC-%Pred-Post: 100 %
FEV6FVC-%Pred-Pre: 100 %
FVC-%Change-Post: -12 %
FVC-%Pred-Post: 81 %
FVC-%Pred-Pre: 92 %
FVC-Post: 2.59 L
FVC-Pre: 2.95 L
Post FEV1/FVC ratio: 77 %
Post FEV6/FVC ratio: 100 %
Pre FEV1/FVC ratio: 93 %
Pre FEV6/FVC Ratio: 100 %
RV % pred: 155 %
RV: 1.8 L
TLC % pred: 94 %
TLC: 4.63 L

## 2019-01-11 LAB — GLUCOSE, CAPILLARY: Glucose-Capillary: 82 mg/dL (ref 70–99)

## 2019-01-11 MED ORDER — FLUDEOXYGLUCOSE F - 18 (FDG) INJECTION
5.0000 | Freq: Once | INTRAVENOUS | Status: AC | PRN
  Administered 2019-01-11: 5 via INTRAVENOUS

## 2019-01-11 MED ORDER — ALBUTEROL SULFATE (2.5 MG/3ML) 0.083% IN NEBU
2.5000 mg | INHALATION_SOLUTION | Freq: Once | RESPIRATORY_TRACT | Status: AC
  Administered 2019-01-11: 12:00:00 2.5 mg via RESPIRATORY_TRACT

## 2019-01-12 ENCOUNTER — Other Ambulatory Visit: Payer: Self-pay

## 2019-01-12 ENCOUNTER — Other Ambulatory Visit: Payer: Self-pay | Admitting: General Surgery

## 2019-01-12 ENCOUNTER — Inpatient Hospital Stay: Payer: 59

## 2019-01-12 VITALS — BP 104/69 | HR 81 | Temp 98.4°F | Resp 18

## 2019-01-12 DIAGNOSIS — D509 Iron deficiency anemia, unspecified: Secondary | ICD-10-CM | POA: Diagnosis not present

## 2019-01-12 DIAGNOSIS — D500 Iron deficiency anemia secondary to blood loss (chronic): Secondary | ICD-10-CM

## 2019-01-12 MED ORDER — DIPHENHYDRAMINE HCL 25 MG PO CAPS
ORAL_CAPSULE | ORAL | Status: AC
  Filled 2019-01-12: qty 1

## 2019-01-12 MED ORDER — ACETAMINOPHEN 325 MG PO TABS
ORAL_TABLET | ORAL | Status: AC
  Filled 2019-01-12: qty 2

## 2019-01-12 MED ORDER — SODIUM CHLORIDE 0.9 % IV SOLN
Freq: Once | INTRAVENOUS | Status: AC
  Administered 2019-01-12: 15:00:00 via INTRAVENOUS
  Filled 2019-01-12: qty 250

## 2019-01-12 MED ORDER — DIPHENHYDRAMINE HCL 25 MG PO CAPS
25.0000 mg | ORAL_CAPSULE | Freq: Once | ORAL | Status: AC
  Administered 2019-01-12: 25 mg via ORAL

## 2019-01-12 MED ORDER — METHYLPREDNISOLONE SODIUM SUCC 125 MG IJ SOLR
60.0000 mg | Freq: Once | INTRAMUSCULAR | Status: AC
  Administered 2019-01-12: 60 mg via INTRAVENOUS

## 2019-01-12 MED ORDER — SODIUM CHLORIDE 0.9 % IV SOLN
200.0000 mg | Freq: Once | INTRAVENOUS | Status: AC
  Administered 2019-01-12: 200 mg via INTRAVENOUS
  Filled 2019-01-12: qty 10

## 2019-01-12 MED ORDER — ACETAMINOPHEN 325 MG PO TABS
650.0000 mg | ORAL_TABLET | Freq: Once | ORAL | Status: AC
  Administered 2019-01-12: 15:00:00 650 mg via ORAL

## 2019-01-12 MED ORDER — FAMOTIDINE IN NACL 20-0.9 MG/50ML-% IV SOLN
20.0000 mg | Freq: Once | INTRAVENOUS | Status: AC
  Administered 2019-01-12: 15:00:00 20 mg via INTRAVENOUS

## 2019-01-12 MED ORDER — METHYLPREDNISOLONE SODIUM SUCC 125 MG IJ SOLR
INTRAMUSCULAR | Status: AC
  Filled 2019-01-12: qty 2

## 2019-01-12 MED ORDER — FAMOTIDINE IN NACL 20-0.9 MG/50ML-% IV SOLN
INTRAVENOUS | Status: AC
  Filled 2019-01-12: qty 50

## 2019-01-12 NOTE — Patient Instructions (Signed)

## 2019-01-13 ENCOUNTER — Other Ambulatory Visit: Payer: Self-pay | Admitting: Hematology

## 2019-01-13 ENCOUNTER — Encounter: Payer: Self-pay | Admitting: Hematology

## 2019-01-13 ENCOUNTER — Encounter (HOSPITAL_COMMUNITY): Payer: Self-pay | Admitting: Hematology

## 2019-01-13 NOTE — Progress Notes (Signed)
Received call from patient regarding financial assistance and surgery.  Advised patient there are no available resources on our end that will help with the cost of her port placement but she can apply for Medicaid online and also advise the pre-service center what she is able to do and request to be billed and advise she will be applying for Medicaid. She verbalized understanding.  Advised patient once her treatment plan has been established, I will reach out to her to advise of all available resources such as the one-time $700 [place] which can assist with medications and other critical expenses as well as copay assistance. Patient states her insurance is just for preventative and does not cover anything.  Advised her I would reach out to Rob in pharmacy to see if she qualifies for any drug replacement. Patient works part-time and just returning to work after a month.  Encouraged her to contact me with any additional financial questions or concerns. She verbalized understanding.

## 2019-01-14 NOTE — H&P (Signed)
[place] Heir  Location: [facility] Patient #: [id number] DOB: 08/04/1995 Single / Language: [name] / Race: Black or African American Female        History of Present Illness The patient is a 23 year old female who presents with a complaint of Hodgkin's Disease. This is a healthy, pleasant, 23 year old female, referred by Verl Blalock, PA at [facility] for evaluation of left inguinal adenopathy. She has been seen at [facility].      She states that she had a left groin mass for about 10 years but it has significantly enlarged over the last year. Not really painful. Never infected. No other enlarged lymph nodes. She denies fevers or night sweats. Denies weight loss. Has regular menses.      On Nov 17, 2018 she had CT scan of abdomen and pelvis which showed a 5 cm left inguinal lymph node. Spleen looked normal. Slight bladder wall thickening suggesting cystitis. Possible recently ruptured ovarian cyst. On Nov 20, 2018 she underwent ultrasound-guided core biopsy. The path showed atypical lymphocytes highly suspicious for lymphoma. Next lab work shows hemoglobin 9.1. White count 3900. Platelet count 226,000.      I performed inguinal lymph node excision which shows Hodgkin's Lymphoma,. Lymphocyte predominance type.      Past history reveals she had a miscarriage in April 2020. That's her only pregnancy. Chronic anemia family history reveals mother and father living and well delivered [place]. No cancer syndromes in the family. No lymphoma or leukemia and the family.  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 Single. Lives with roommates. Works as a Scientist, water quality at [employer]. Denies alcohol or tobacco.     She will be scheduled for port as cath insertion as outpatient. I discussed the indications, details, techniques, and he was risk of the surgery with her.  All questions were answered. She agrees with this  plan. I suggested that she might want to stay at her parents house for a couple of days.   Past Surgical History No pertinent past surgical history   Diagnostic Studies History  Colonoscopy  never Mammogram  never Pap Smear  never  Allergies No Known Drug Allergies   Allergies Reconciled   Medication History No Current Medications Medications Reconciled  Social History Alcohol use  Occasional alcohol use. Caffeine use  Carbonated beverages. No drug use   Family History  Arthritis  Family Members In General. Cancer  Family Members In General. Cerebrovascular Accident  Family Members In General. Diabetes Mellitus  Family Members In General. Heart Disease  Family Members In General. Hypertension  Family Members In General. Migraine Headache  Family Members In General, Mother. Prostate Cancer  Family Members In General.  Pregnancy / Birth History Age at menarche  89 years. Gravida  0 Maternal age  67-25 Para  0 Regular periods   Other Problems Asthma     Review of Systems  General Not Present- Appetite Loss, Chills, Fatigue, Fever, Night Sweats, Weight Gain and Weight Loss. Skin Present- Dryness. Not Present- Change in Wart/Mole, Hives, Jaundice, New Lesions, Non-Healing Wounds, Rash and Ulcer. HEENT Present- Seasonal Allergies. Not Present- Earache, Hearing Loss, Hoarseness, Nose Bleed, Oral Ulcers, Ringing in the Ears, Sinus Pain, Sore Throat, Visual Disturbances, Wears glasses/contact lenses and Yellow Eyes. Respiratory Not Present- Bloody sputum, Chronic Cough, Difficulty Breathing, Snoring and Wheezing. Breast Not Present- Breast Mass, Breast Pain, Nipple Discharge and Skin Changes. Cardiovascular Not Present- Chest Pain, Difficulty Breathing Lying  Down, Leg Cramps, Palpitations, Rapid Heart Rate, Shortness of Breath and Swelling of Extremities. Gastrointestinal Present- Abdominal Pain and Gets full quickly at meals. Not Present-  Bloating, Bloody Stool, Change in Bowel Habits, Chronic diarrhea, Constipation, Difficulty Swallowing, Excessive gas, Hemorrhoids, Indigestion, Nausea, Rectal Pain and Vomiting. Female Genitourinary Present- Frequency and Nocturia. Not Present- Painful Urination, Pelvic Pain and Urgency. Musculoskeletal Not Present- Back Pain, Joint Pain, Joint Stiffness, Muscle Pain, Muscle Weakness and Swelling of Extremities. Neurological Not Present- Decreased Memory, Fainting, Headaches, Numbness, Seizures, Tingling, Tremor, Trouble walking and Weakness. Psychiatric Not Present- Anxiety, Bipolar, Change in Sleep Pattern, Depression, Fearful and Frequent crying. Endocrine Not Present- Cold Intolerance, Excessive Hunger, Hair Changes, Heat Intolerance, Hot flashes and New Diabetes. Hematology Not Present- Blood Thinners, Easy Bruising, Excessive bleeding, Gland problems, HIV and Persistent Infections.  Vitals  Weight: 101.5 lb Height: 63in Body Surface Area: 1.45 m Body Mass Index: 17.98 kg/m  Temp.: 99.75F (Oral)  Pulse: 101 (Regular)  BP: 110/62(Sitting, Left Arm, Standard)     Physical Exam  General Mental Status-Alert. General Appearance-Consistent with stated age. Hydration-Well hydrated. Voice-Normal.  Head and Neck Head-normocephalic, atraumatic with no lesions or palpable masses. Trachea-midline. Thyroid Gland Characteristics - normal size and consistency.  Eye Eyeball - Bilateral-Extraocular movements intact. Sclera/Conjunctiva - Bilateral-No scleral icterus.  Chest and Lung Exam Chest and lung exam reveals -quiet, even and easy respiratory effort with no use of accessory muscles and on auscultation, normal breath sounds, no adventitious sounds and normal vocal resonance. Inspection Chest Wall - Normal. Back - normal.  Breast Note: Breasts are small. Skin, nipple and areola healthy. There is a little bit of nonfocal lumpiness in the central and  lateral right breast that she asked me to look at. There is a tiny palpable right axillary lymph node. No lymph nodes in the left axilla   Cardiovascular Cardiovascular examination reveals -normal heart sounds, regular rate and rhythm with no murmurs and normal pedal pulses bilaterally.  Abdomen Inspection Inspection of the abdomen reveals - No Hernias. Skin - Scar - no surgical scars. Palpation/Percussion Palpation and Percussion of the abdomen reveal - Soft, Non Tender, No Rebound tenderness, No Rigidity (guarding) and No hepatosplenomegaly. Auscultation Auscultation of the abdomen reveals - Bowel sounds normal.  Neurologic Neurologic evaluation reveals -alert and oriented x 3 with no impairment of recent or remote memory. Mental Status-Normal.  Musculoskeletal Normal Exam - Left-Upper Extremity Strength Normal and Lower Extremity Strength Normal. Normal Exam - Right-Upper Extremity Strength Normal and Lower Extremity Strength Normal.  Lymphatic Head & Neck  General Head & Neck Lymphatics: Bilateral - Description - Normal. Axillary  General Axillary Region: Bilateral - Description - Normal. Tenderness - Non Tender. Femoral & Inguinal  Generalized Femoral & Inguinal Lymphatics: Bilateral - Description - Normal. Tenderness - Non Tender. Note: No mass in the supraclavicular or cervical region Right axilla reveals a tiny mobile nontender lymph node. Left axilla is negative Right inguinal area is negative Left inguinal area reveals recent scar healing well..     Assessment & Plan   INGUINAL ADENOPATHY (R59.0) Hodgkin's Lymphoma   The lymph node in your left inguinal area was biopsied and shows Hodgkin's Lymphoma. Dr. Carolyne Fiscal has evaluated you and recommends chemotherapy. Port a cath insertion is scheduled. I have discussed the indications, techniques and risks of this procedure with you and you agree.   CHRONIC ANEMIA (D64.9)      Edsel Petrin. Dalbert Batman,  M.D., [facility] General and Minimally invasive Surgery  Breast and Colorectal Surgery Office:   [phone] Pager:   [phone]

## 2019-01-18 ENCOUNTER — Encounter (HOSPITAL_COMMUNITY): Payer: Self-pay | Admitting: *Deleted

## 2019-01-18 ENCOUNTER — Other Ambulatory Visit: Payer: Self-pay

## 2019-01-18 ENCOUNTER — Other Ambulatory Visit (HOSPITAL_COMMUNITY)
Admission: RE | Admit: 2019-01-18 | Discharge: 2019-01-18 | Disposition: A | Discharge status: Home / Self Care | Payer: 59 | Source: Ambulatory Visit | Attending: General Surgery | Admitting: General Surgery

## 2019-01-18 DIAGNOSIS — Z1159 Encounter for screening for other viral diseases: Secondary | ICD-10-CM | POA: Insufficient documentation

## 2019-01-18 LAB — SARS CORONAVIRUS 2 (TAT 6-24 HRS): SARS Coronavirus 2: NEGATIVE

## 2019-01-18 NOTE — Progress Notes (Addendum)
Denies chest pain or shob. Denies cardiology test. Told she can have clear liquids up to 3 hours prior to surgery (at time of note 0930) other fluids and food to stop at midnight. Informed of visitor policy and states she is quarantining as directed post COVID test.  Covered what clear liquids were as below                                                                            Clear Liquids   Water   Fruit juices (non-citric and without pulp)   Carbonated beverages   Clear tea   Black coffee only   Gatorade   Plain Jell-O  Plain ice popsicles

## 2019-01-19 ENCOUNTER — Inpatient Hospital Stay: Payer: 59

## 2019-01-19 ENCOUNTER — Inpatient Hospital Stay (HOSPITAL_BASED_OUTPATIENT_CLINIC_OR_DEPARTMENT_OTHER): Payer: 59 | Admitting: Hematology

## 2019-01-19 ENCOUNTER — Other Ambulatory Visit: Payer: Self-pay

## 2019-01-19 ENCOUNTER — Telehealth: Payer: Self-pay | Admitting: *Deleted

## 2019-01-19 ENCOUNTER — Telehealth: Payer: Self-pay | Admitting: Hematology

## 2019-01-19 ENCOUNTER — Other Ambulatory Visit: Payer: Self-pay | Admitting: Lab

## 2019-01-19 VITALS — BP 97/53 | HR 76

## 2019-01-19 VITALS — BP 116/70 | HR 94 | Temp 98.9°F | Resp 18 | Ht 63.0 in | Wt 97.7 lb

## 2019-01-19 DIAGNOSIS — D509 Iron deficiency anemia, unspecified: Secondary | ICD-10-CM | POA: Diagnosis not present

## 2019-01-19 DIAGNOSIS — D500 Iron deficiency anemia secondary to blood loss (chronic): Secondary | ICD-10-CM

## 2019-01-19 DIAGNOSIS — R300 Dysuria: Secondary | ICD-10-CM

## 2019-01-19 DIAGNOSIS — C8190 Hodgkin lymphoma, unspecified, unspecified site: Secondary | ICD-10-CM

## 2019-01-19 LAB — URINALYSIS, COMPLETE (UACMP) WITH MICROSCOPIC
Bacteria, UA: NONE SEEN
Bilirubin Urine: NEGATIVE
Glucose, UA: NEGATIVE mg/dL
Hgb urine dipstick: NEGATIVE
Ketones, ur: NEGATIVE mg/dL
Leukocytes,Ua: NEGATIVE
Nitrite: NEGATIVE
Protein, ur: NEGATIVE mg/dL
Specific Gravity, Urine: 1.024 (ref 1.005–1.030)
pH: 6 (ref 5.0–8.0)

## 2019-01-19 MED ORDER — DIPHENHYDRAMINE HCL 25 MG PO CAPS
25.0000 mg | ORAL_CAPSULE | Freq: Once | ORAL | Status: AC
  Administered 2019-01-19: 25 mg via ORAL

## 2019-01-19 MED ORDER — SODIUM CHLORIDE 0.9 % IV SOLN
200.0000 mg | Freq: Once | INTRAVENOUS | Status: AC
  Administered 2019-01-19: 200 mg via INTRAVENOUS
  Filled 2019-01-19: qty 10

## 2019-01-19 MED ORDER — ACETAMINOPHEN 325 MG PO TABS
650.0000 mg | ORAL_TABLET | Freq: Once | ORAL | Status: AC
  Administered 2019-01-19: 650 mg via ORAL

## 2019-01-19 MED ORDER — DIPHENHYDRAMINE HCL 25 MG PO CAPS
ORAL_CAPSULE | ORAL | Status: AC
  Filled 2019-01-19: qty 1

## 2019-01-19 MED ORDER — SODIUM CHLORIDE 0.9 % IV SOLN
Freq: Once | INTRAVENOUS | Status: AC
  Administered 2019-01-19: 17:00:00 via INTRAVENOUS
  Filled 2019-01-19: qty 250

## 2019-01-19 MED ORDER — METHYLPREDNISOLONE SODIUM SUCC 125 MG IJ SOLR
INTRAMUSCULAR | Status: AC
  Filled 2019-01-19: qty 2

## 2019-01-19 MED ORDER — METHYLPREDNISOLONE SODIUM SUCC 125 MG IJ SOLR
60.0000 mg | Freq: Once | INTRAMUSCULAR | Status: AC
  Administered 2019-01-19: 60 mg via INTRAVENOUS

## 2019-01-19 MED ORDER — ACETAMINOPHEN 325 MG PO TABS
ORAL_TABLET | ORAL | Status: AC
  Filled 2019-01-19: qty 2

## 2019-01-19 MED ORDER — FAMOTIDINE IN NACL 20-0.9 MG/50ML-% IV SOLN
INTRAVENOUS | Status: AC
  Filled 2019-01-19: qty 50

## 2019-01-19 MED ORDER — FAMOTIDINE IN NACL 20-0.9 MG/50ML-% IV SOLN
20.0000 mg | Freq: Once | INTRAVENOUS | Status: AC
  Administered 2019-01-19: 20 mg via INTRAVENOUS

## 2019-01-19 NOTE — Telephone Encounter (Signed)
Contacted Dr. Dalbert Batman at [facility] at [place] request - cancelled port a cath placement procedure scheduled for 7/23. Caryl Pina, surgical scheduler took information and cancelled procedure.

## 2019-01-19 NOTE — Progress Notes (Signed)
HEMATOLOGY/ONCOLOGY CONSULTATION NOTE  Date of Service: 01/19/2019  Patient Care Team: Patient, No Pcp Per as PCP - General (General Practice)   CHIEF COMPLAINTS/PURPOSE OF CONSULTATION: Lymphocyte predominant hodgkins lymphoma   HISTORY OF PRESENTING ILLNESS:  Lauralye C. Gould is a wonderful 23 y.o. female who has been referred to [place] by Dr. Fanny Skates for evaluation and management of her newly diagnosed Lymphoproliferative disorder. The pt reports that she is doing well overall. She is accompanied today by her Mom, via [employer].  The pt notes that she first noticed her enlarged left inguinal lymph node in 2010, and notes that it was "really tiny." She presented to an urgent care initially at the time. She is unsure if the lymph node reduced in size.  The pt notes that she noticed her left inguinal lymph node being larger and more sensitive last summer 2019 after a dog jumped on her. She notes that after this, the lymph node continued growing in size. She notes that the lymph node began causing pain in February 2020. She notes that this lymph node would increase in size in correlation with her periods. The pt notes that she made an appointment to see Ellen Henri, [place] at [facility], who referred her to surgeon Dr. Dalbert Batman.   The pt denies noticing any other new lumps or bumps and denies fevers, chills, night sweats or unexpected weight loss. The pt notes that she has had some new SOB, which she notes improves after eating ice, and has occurred more frequently recently. She endorses ice picca symptoms. She feels that she has to take deep breaths at night when she "settles down."  She does continue to feel a sense of swelling in her left groin.  The pt also endorses some discomfort in her right, lower abdomen. She was seen to have a thickened bladder and right sided ovarian cyst on the 11/17/18 CT A/P, as noted below. She denies a history of kidney stones or UTIs. She also notes that  she has felt some discomfort since her miscarriage in late April.  The pt denies using any steroids recently. The pt reports that she has had stable asthma and uses an inhaler as needed, not used frequently, endorses seasonal correlation. The pt also notes that she has been anemic since 2010, thought to be related to iron deficiency and endorses heavier periods. She notes that her periods can last up to 7-8 days which is her baseline. She has been taking PO Iron replacement for the last two months, denies other previous iron replacement. She has never had a blood transfusion.   The pt notes that she was pregnant once before and had a miscarriage about two months ago. She notes this occurred around 8-[redacted] weeks gestation, not followed by D&C.   The pt denies past surgeries and denies any medication allergies. She is not taking any chronic medications.  Of note prior to the patient's visit today, pt has had a CT A/P completed on 11/17/18 with results revealing "Enlarged presumed lymph node in the left inguinal region measuring 5.2 x 3.9 x 3.8 cm. There is enhancement of this lymph node. Etiology for this localized lymph node prominence is uncertain. Tissue sampling may well be warranted given this finding. No other adenopathy noted in the abdomen or pelvis. 2.  Spleen does not appear appreciably enlarged. 3. Wall thickening in the urinary bladder. Suspect a degree of cystitis. No renal or ureteral calculus. No hydronephrosis. 4. Apparent recent ovarian cyst  rupture on the right with mild fluid tracking from the right adnexa and cul-de-sac. 5. No evident bowel obstruction or bowel wall thickening. No abscess in the abdomen pelvis. No periappendiceal region inflammatory change."  Most recent lab results (623/20) of CBC w/diff and CMP is as follows: all values are WNL except for WBC at 3.5k, HGB at 9.4, HCT at 32.5, MCV at 65.7, MCH at 19.0, MCHC at 28.9, RDW at 21.3, ANC at 1.5k, Potassium at 3.3.  On review of  systems, pt reports some SOB, discomfort in right lower abdomen, left inguinal swelling, and denies fevers, chills, night sweats, unexpected weight loss, noticing any other lumps or bumps, mouth soreness, leg swelling, skin rashes, itching, and any other symptoms.   On PMHx the pt reports asthma and iron deficiency anemia. One pregnancy with miscarriage at 8-9 weeks. On Social Hx the pt denies smoking cigarettes and denies alcohol consumption. She works at [employer] as a Scientist, water quality. On Family Hx the pt denies blood disorders nor cancers.   INTERVAL HISTORY:  Leah Gould is here today for follow up and treatment of her Lymphoproliferative Disorder. The patient's last visit with [place] was on 01/05/2019. The pt reports that she is doing well overall.  The pt reports that her menstrual cycles are extremely heavy. She notes that she can fill up a pad in an hour and that she menstruates like this for around 5-7 days. She does crave a lot of ice. She tolerated the first IV Iron well.   Of note since the patient's last visit, pt has had a PET scan completed on 01/11/2019 with results revealing Bilateral axillary and right subpectoral adenopathy noted with Deauville 5 disease on the left and Deauville 4 disease on the right in this region. Diffuse uniform endometrial activity with SUV of 13.2. Although this is very high for physiologic endometrial activity, the diffuse uniform distribution is more characteristic of benign physiologic endometrial activity rather than malignancy. If the patient has abnormal uterine bleeding or if otherwise clinically warranted, pelvic sonography could be utilized for further characterization of the endometrium. Postoperative findings and low-grade metabolic activity in the vicinity of the excision biopsy of the left inguinal lymph node. No other hypermetabolic adenopathy in the abdomen/pelvis identified.  She had an echocardiogram on 01/07/2019, showing an ejection fraction in the  60% - 65% range.   She also had a pulmonary function test on 01/11/2019 showing results are within normal limits.   Additional biopsy sampling ([id number]) on 07/0/2020 from the [facility] shows lymphocyte predominant Hodgekin's lymphoma.    Lab results from 01/05/2019 of CBC w/diff and CMP is as follows: all values are WNL except for WBC at 3.9K, hemoglobin at 8.9, HCT at 31.1, MCV at 65.9, HCH at 18.9, HCHC at 28.6, RDW at 20.0.  On review of systems, pt reports heavy periods, decreased appetite denies other symptoms.    MEDICAL HISTORY:  Past Medical History:  Diagnosis Date   Anemia    Asthma    no meds, allergy induced   Inguinal adenopathy 12/22/2018    SURGICAL HISTORY: Past Surgical History:  Procedure Laterality Date   MASS EXCISION Left 12/22/2018   Procedure: EXCISION DEEP LEFT INGUINAL LYMPH NODE;  Surgeon: Fanny Skates, MD;  Location: [place];  Service: General;  Laterality: Left;    SOCIAL HISTORY: Social History   Socioeconomic History   Marital status: Single    Spouse name: Not on file   Number of children: Not on  file   Years of education: Not on file   Highest education level: Not on file  Occupational History   Not on file  Social Needs   Financial resource strain: Not on file   Food insecurity    Worry: Not on file    Inability: Not on file   Transportation needs    Medical: Not on file    Non-medical: Not on file  Tobacco Use   Smoking status: Never Smoker   Smokeless tobacco: Never Used  Substance and Sexual Activity   Alcohol use: No   Drug use: No   Sexual activity: Yes    Birth control/protection: None  Lifestyle   Physical activity    Days per week: Not on file    Minutes per session: Not on file   Stress: Not on file  Relationships   Social connections    Talks on phone: Not on file    Gets together: Not on file    Attends religious service: Not on file    Active member of  club or organization: Not on file    Attends meetings of clubs or organizations: Not on file    Relationship status: Not on file   Intimate partner violence    Fear of current or ex partner: Not on file    Emotionally abused: Not on file    Physically abused: Not on file    Forced sexual activity: Not on file  Other Topics Concern   Not on file  Social History Narrative   Not on file    FAMILY HISTORY: No family history on file.  ALLERGIES:  is allergic to fruit & vegetable daily [nutritional supplements] and peanut-containing drug products.  MEDICATIONS:  Current Outpatient Medications  Medication Sig Dispense Refill   HYDROcodone-acetaminophen (NORCO) 5-325 MG tablet Take 1-2 tablets by mouth every 6 (six) hours as needed for moderate pain or severe pain. (Patient not taking: Reported on 01/13/2019) 20 tablet 0   No current facility-administered medications for this visit.     REVIEW OF SYSTEMS:   A 10+ POINT REVIEW OF SYSTEMS WAS OBTAINED including neurology, dermatology, psychiatry, cardiac, respiratory, lymph, extremities, GI, GU, Musculoskeletal, constitutional, breasts, reproductive, HEENT.  All pertinent positives are noted in the HPI.  All others are negative.     PHYSICAL EXAMINATION: ECOG PERFORMANCE STATUS: 0 - Asymptomatic  There were no vitals filed for this visit. There were no vitals filed for this visit. There is no height or weight on file to calculate BMI.  GENERAL:alert, in no acute distress and comfortable SKIN: no acute rashes, no significant lesions EYES: conjunctiva are pink and non-injected, sclera anicteric OROPHARYNX: MMM, no exudates, no oropharyngeal erythema or ulceration NECK: supple, no JVD LYMPH: palpable left inguinal lymph node 3-4 cm. no palpable lymphadenopathy in the cervical or axillary regions LUNGS: clear to auscultation b/l with normal respiratory effort HEART: regular rate & rhythm ABDOMEN:  normoactive bowel sounds , non  tender, not distended. Mild tenderness to deep palpation. No palpable hepatosplenomegaly. Extremity: no pedal edema PSYCH: alert & oriented x 3 with fluent speech NEURO: no focal motor/sensory deficits   LABORATORY DATA:  I have reviewed the data as listed  CBC Latest Ref Rng & Units 01/05/2019 12/22/2018 11/20/2018  WBC 4.0 - 10.5 K/uL 3.9(L) 3.5(L) 3.9(L)  Hemoglobin 12.0 - 15.0 g/dL 8.9(L) 9.4(L) 9.1(L)  Hematocrit 36.0 - 46.0 % 31.1(L) 32.5(L) 31.8(L)  Platelets 150 - 400 K/uL 270 308 226   CMP Latest Ref  Rng & Units 01/05/2019 12/22/2018  Glucose 70 - 99 mg/dL 93 93  BUN 6 - 20 mg/dL 7 11  Creatinine 0.44 - 1.00 mg/dL 0.79 0.83  Sodium 135 - 145 mmol/L 138 138  Potassium 3.5 - 5.1 mmol/L 3.8 3.3(L)  Chloride 98 - 111 mmol/L 106 104  CO2 22 - 32 mmol/L 24 24  Calcium 8.9 - 10.3 mg/dL 9.5 9.6  Total Protein 6.5 - 8.1 g/dL 7.8 7.5  Total Bilirubin 0.3 - 1.2 mg/dL 0.4 0.4  Alkaline Phos 38 - 126 U/L 49 49  AST 15 - 41 U/L 21 29  ALT 0 - 44 U/L 12 18    01/01/2019 Lymph Node Biopsy ([id number])   12/22/18 Left Inguinal LN Biopsy:     RADIOGRAPHIC STUDIES: I have personally reviewed the radiological images as listed and agreed with the findings in the report. Nm Pet Image Initial (pi) Skull Base To Thigh  Result Date: 01/11/2019 CLINICAL DATA:  Initial treatment strategy for Hodgkin lymphoma. EXAM: NUCLEAR MEDICINE PET SKULL BASE TO THIGH TECHNIQUE: 5.0 mCi F-18 FDG was injected intravenously. Full-ring PET imaging was performed from the skull base to thigh after the radiotracer. CT data was obtained and used for attenuation correction and anatomic localization. Fasting blood glucose: 82 mg/dl COMPARISON:  CT abdomen/pelvis from 11/17/2018 FINDINGS: Mediastinal blood pool activity: SUV max 1.8 Liver activity: SUV max 2.6 NECK: Symmetric activity along the submandibular glands and tongue base. Left palatine tonsillar activity maximum SUV 8.7, right-sided activity 7.0, without masslike  appearance. No appreciable adenopathy in the neck. Incidental CT findings: none CHEST: Bilateral axillary and right subpectoral adenopathy noted. A 1.4 cm left axillary node on image 47/4 has a maximum SUV of 7.1, compatible with Deauville 5 disease. A right axillary lymph node measuring 1.1 cm in short axis on image 51/4 has a maximum SUV of 4.6, compatible with Deauville 4 disease. Low-grade anterior mediastinal density in activity is compatible with thymic tissue. Incidental CT findings: none ABDOMEN/PELVIS: Diffuse uniform endometrial activity is observed with maximum SUV of 13.2. This is high for physiologic endometrial activity. Low-grade activity at the site of excisional biopsy of the left inguinal lymph node, maximum SUV 2.9 in this vicinity, Deauville 4. No abnormal splenic activity or splenomegaly. Incidental CT findings: none SKELETON: No significant abnormal hypermetabolic activity in this region. Incidental CT findings: none IMPRESSION: 1. Bilateral axillary and right subpectoral adenopathy noted with Deauville 5 disease on the left and Deauville 4 disease on the right in this region. 2. Diffuse uniform endometrial activity with SUV of 13.2. Although this is very high for physiologic endometrial activity, the diffuse uniform distribution is more characteristic of benign physiologic endometrial activity rather than malignancy. If the patient has abnormal uterine bleeding or if otherwise clinically warranted, pelvic sonography could be utilized for further characterization of the endometrium. 3. Postoperative findings and low-grade metabolic activity in the vicinity of the excision biopsy of the left inguinal lymph node. No other hypermetabolic adenopathy in the abdomen/pelvis identified. Electronically Signed   By: Van Clines M.D.   On: 01/11/2019 10:24    ASSESSMENT & PLAN:  Leah Gould is a 23 y.o. black female with  1. Lymphoproliferative Disorder of unspecified type Likely  Lymphocyte predominant Hodgkin's Lymphoma Presenting without constitutional symptoms  12/22/18 Inguinal lymph node biopsy which revealed a process most concerning for either a lymphocytic predominant Hodgkin's lymphoma vs T-cell/histocyte rich diffuse large b-cell lymphoma. The sample was sent for a second opinion and is awaiting further  characterization for confirmation.  11/17/18 CT A/P which revealed "Enlarged presumed lymph node in the left inguinal region measuring 5.2 x 3.9 x 3.8 cm. There is enhancement of this lymph node. Etiology for this localized lymph node prominence is uncertain. Tissue sampling may well be warranted given this finding. No other adenopathy noted in the abdomen or pelvis. 2.  Spleen does not appear appreciably enlarged. 3. Wall thickening in the urinary bladder. Suspect a degree of cystitis. No renal or ureteral calculus. No hydronephrosis. 4. Apparent recent ovarian cyst rupture on the right with mild fluid tracking from the right adnexa and cul-de-sac. 5. No evident bowel obstruction or bowel wall thickening. No abscess in the abdomen pelvis. No periappendiceal region inflammatory change."  2. Iron deficiency anemia   PLAN: -Discussed pt labwork from 01/05/2019; all values are WNL except for WBC at 3.9K, hemoglobin at 8.9, HCT at 31.1, MCV at 65.9, Garza-Salinas II at 18.9, [place] at 28.6, RDW at 20.0. -Discussed 01/11/2019 PET scan with results revealing Bilateral axillary and right subpectoral adenopathy noted with Deauville 5 disease on the left and Deauville 4 disease on the right in this region. Diffuse uniform endometrial activity with SUV of 13.2. Although this is very high for physiologic endometrial activity, the diffuse uniform distribution is more characteristic of benign physiologic endometrial activity rather than malignancy. If the patient has abnormal uterine bleeding or if otherwise clinically warranted, pelvic sonography could be utilized for further characterization of the  endometrium. Postoperative findings and low-grade metabolic activity in the vicinity of the excision biopsy of the left inguinal lymph node. No other hypermetabolic adenopathy in the abdomen/pelvis identified. -Discussed 01/01/2019 biopsy sampling ([id number]) from the [facility] shows lymphocyte predominant Hodgkin's lymphoma.   -Discussed lymphocyte predominate Hodgkin's lymphoma -Discussed treatment options including observation vs Rituxan vs R-CHOP. Discussed tumor board recommendation of proceeding with RItuxan monotherapy -Discussed side effects of Rituxan  -Discussed chemotherapy education -Discussed repeat PET scan in 2-3 months following last dose of Rituxan -Discussed possible recurrence plans  -Discussed fertility preservation less of an issue at this time with Rituxan alone. -Provided handout regarding lymphocyte predominate Hodgkin's lymphoma -Discussed need for f/u with Ob [facility] for mx of menorrhagia and evaluation of thicken endometrium-- patient notes she has an upcoming appointment. -Urine sample to check for urinary infection- reviewed no evidence of UTI  FOLLOW UP: Labs for urine testing today Chemo-counseling for Rituxan in 3-5 days Please schedule to start Rituxan weekly x 4 doses with labs (to start in 1-2 weeks) MD visit with 2nd dose of Rituxan for toxicity check    All of the patients questions were answered with apparent satisfaction. The patient knows to call the clinic with any problems, questions or concerns.  The total time spent in the appt was 30 inutes and more than 50% was on counseling and direct patient cares.     Sullivan Lone MD MS AAHIVMS [facility] Hematology/Oncology Physician [facility]  (Office):       [phone] (Work cell):  [phone] (Fax):           [phone]  01/19/2019 1:20 AM   I, Jacqualyn Posey, am acting as a Education administrator for Dr. Sullivan Lone.   .I have reviewed the above documentation for accuracy and  completeness, and I agree with the above. Brunetta Genera MD

## 2019-01-19 NOTE — Patient Instructions (Signed)

## 2019-01-19 NOTE — Telephone Encounter (Signed)
Scheduled appt per 7/21 los.  Spoke with patient and she is aware of her appt date and time.

## 2019-01-20 ENCOUNTER — Telehealth: Payer: Self-pay | Admitting: *Deleted

## 2019-01-20 NOTE — Telephone Encounter (Signed)
Called pt to see if she would be willing to participate in a research study that examines the efficacy of patient education for cancer immunotherapy treatments.  Informed that there is no cost & would require a little extra time for surveys while here this Friday & she would be entered into a drawing for $50 [employer] Card for Month of July.  She expressed interest & will plan to come & be here in person for teaching this Friday.  Message to scheduler to change.

## 2019-01-20 NOTE — Telephone Encounter (Signed)
Attempted to contact patient with information from Dr. Irene Limbo.  LVM with following info and encouraged to contact office for any questions: Urinalysis negative for UTI and inform her that port placement appt scheduled for today has been cancelled as she will not need it.

## 2019-01-21 ENCOUNTER — Ambulatory Visit (HOSPITAL_COMMUNITY): Admission: RE | Admit: 2019-01-21 | Payer: 59 | Source: Home / Self Care | Admitting: General Surgery

## 2019-01-21 LAB — URINE CULTURE

## 2019-01-21 SURGERY — INSERTION, TUNNELED CENTRAL VENOUS DEVICE, WITH PORT
Anesthesia: General

## 2019-01-22 ENCOUNTER — Telehealth: Payer: Self-pay | Admitting: Hematology

## 2019-01-22 ENCOUNTER — Inpatient Hospital Stay: Payer: 59

## 2019-01-22 ENCOUNTER — Telehealth: Payer: Self-pay | Admitting: *Deleted

## 2019-01-22 NOTE — Telephone Encounter (Signed)
I could not reach patient regarding 7/28

## 2019-01-22 NOTE — Telephone Encounter (Signed)
Pt did not show for education class.  Reached her by phone & she wishes to r/s & was very apologetic. She would like t r/s for tues, 7/28 after iron infusion.  Schedule message sent.

## 2019-01-26 ENCOUNTER — Inpatient Hospital Stay: Payer: 59

## 2019-01-26 ENCOUNTER — Other Ambulatory Visit: Payer: Self-pay

## 2019-01-26 ENCOUNTER — Ambulatory Visit: Payer: 59

## 2019-01-26 VITALS — BP 97/54 | HR 82 | Temp 99.1°F | Resp 18

## 2019-01-26 DIAGNOSIS — D509 Iron deficiency anemia, unspecified: Secondary | ICD-10-CM | POA: Diagnosis not present

## 2019-01-26 DIAGNOSIS — D500 Iron deficiency anemia secondary to blood loss (chronic): Secondary | ICD-10-CM

## 2019-01-26 MED ORDER — METHYLPREDNISOLONE SODIUM SUCC 125 MG IJ SOLR
INTRAMUSCULAR | Status: AC
  Filled 2019-01-26: qty 2

## 2019-01-26 MED ORDER — DIPHENHYDRAMINE HCL 25 MG PO CAPS
ORAL_CAPSULE | ORAL | Status: AC
  Filled 2019-01-26: qty 1

## 2019-01-26 MED ORDER — METHYLPREDNISOLONE SODIUM SUCC 125 MG IJ SOLR
60.0000 mg | Freq: Once | INTRAMUSCULAR | Status: AC
  Administered 2019-01-26: 16:00:00 60 mg via INTRAVENOUS

## 2019-01-26 MED ORDER — FAMOTIDINE IN NACL 20-0.9 MG/50ML-% IV SOLN
20.0000 mg | Freq: Once | INTRAVENOUS | Status: AC
  Administered 2019-01-26: 20 mg via INTRAVENOUS

## 2019-01-26 MED ORDER — DIPHENHYDRAMINE HCL 25 MG PO CAPS
25.0000 mg | ORAL_CAPSULE | Freq: Once | ORAL | Status: AC
  Administered 2019-01-26: 25 mg via ORAL

## 2019-01-26 MED ORDER — SODIUM CHLORIDE 0.9 % IV SOLN
200.0000 mg | Freq: Once | INTRAVENOUS | Status: AC
  Administered 2019-01-26: 16:00:00 200 mg via INTRAVENOUS
  Filled 2019-01-26: qty 10

## 2019-01-26 MED ORDER — ACETAMINOPHEN 325 MG PO TABS
ORAL_TABLET | ORAL | Status: AC
  Filled 2019-01-26: qty 2

## 2019-01-26 MED ORDER — ACETAMINOPHEN 325 MG PO TABS
650.0000 mg | ORAL_TABLET | Freq: Once | ORAL | Status: AC
  Administered 2019-01-26: 650 mg via ORAL

## 2019-01-26 MED ORDER — SODIUM CHLORIDE 0.9 % IV SOLN
Freq: Once | INTRAVENOUS | Status: AC
  Administered 2019-01-26: 15:00:00 via INTRAVENOUS
  Filled 2019-01-26: qty 250

## 2019-01-26 MED ORDER — FAMOTIDINE IN NACL 20-0.9 MG/50ML-% IV SOLN
INTRAVENOUS | Status: AC
  Filled 2019-01-26: qty 50

## 2019-01-26 NOTE — Patient Instructions (Signed)

## 2019-01-27 ENCOUNTER — Inpatient Hospital Stay: Payer: 59

## 2019-01-27 ENCOUNTER — Other Ambulatory Visit: Payer: Self-pay

## 2019-01-27 ENCOUNTER — Encounter: Payer: Self-pay | Admitting: Hematology

## 2019-01-27 NOTE — Progress Notes (Signed)
Spoke with patient in lobby to ask if she brought proof of income to apply for one-time $700 [employer]. Patient states she forgot but can bring at her next visit. Advised patient of urgent need available funds through [facility] and asked if she would like for me to apply on her behalf and she states yes.  Applied through [place] for urgent need stipend for patient. Patient approved for one-time $500 award.'  Called patient to advise of the approval and that she will receive a letter explaining how to use and a check in 5-7 business days. She verbalized understanding and was very Patent attorney. She will bring her proof of income on Monday and leave a copy for me at front desk in my absence to be placed in my mailbox.  She has my card for any additional financial questions or concerns.

## 2019-01-28 ENCOUNTER — Other Ambulatory Visit: Payer: Self-pay | Admitting: Hematology

## 2019-01-28 DIAGNOSIS — Z7189 Other specified counseling: Secondary | ICD-10-CM

## 2019-01-28 DIAGNOSIS — C8108 Nodular lymphocyte predominant Hodgkin lymphoma, lymph nodes of multiple sites: Secondary | ICD-10-CM | POA: Insufficient documentation

## 2019-01-28 NOTE — Progress Notes (Signed)
START OFF PATHWAY REGIMEN - Lymphoma and CLL   [id number]:Rituximab (IV/Subcut) D1 Weekly:   A cycle is every 7 days:     Rituximab-xxxx      Rituximab and hyaluronidase human   **Always confirm dose/schedule in your pharmacy ordering system**  Patient Characteristics: Nodular Lymphocyte Predominant Hodgkin Lymphoma, First Line, Stage III / IV Disease Type: Not Applicable Disease Type: Not Applicable Disease Type: Nodular Lymphocyte Predominant Hodgkin Lymphoma Line of therapy: First Line [place] Stage: IIIA Intent of Therapy: Curative Intent, Discussed with Patient

## 2019-01-29 ENCOUNTER — Telehealth: Payer: Self-pay

## 2019-01-29 NOTE — Telephone Encounter (Signed)
Received a message from [place], [facility] that patient does not have prior approval for Rituxan. Appointment for Rituxan on 8/3 canceled. Patient made aware and knows that she still needs to come for Venofer on 02/02/19 because it has been approved. Patient made aware that when she comes to Tuesday's appointment Rob will have her sign forms for patient assistance for Rituxan. Patient still has appointment on 02/08/19 for Rituxan and will wait for updates on patient assistance.

## 2019-02-01 ENCOUNTER — Other Ambulatory Visit: Payer: 59

## 2019-02-01 ENCOUNTER — Ambulatory Visit: Payer: 59

## 2019-02-02 ENCOUNTER — Encounter: Payer: Self-pay | Admitting: Hematology

## 2019-02-02 ENCOUNTER — Other Ambulatory Visit: Payer: Self-pay

## 2019-02-02 ENCOUNTER — Inpatient Hospital Stay: Payer: 59 | Attending: Hematology

## 2019-02-02 VITALS — BP 101/66 | HR 97 | Temp 99.6°F | Resp 16

## 2019-02-02 DIAGNOSIS — C8190 Hodgkin lymphoma, unspecified, unspecified site: Secondary | ICD-10-CM | POA: Diagnosis present

## 2019-02-02 DIAGNOSIS — D500 Iron deficiency anemia secondary to blood loss (chronic): Secondary | ICD-10-CM

## 2019-02-02 DIAGNOSIS — Z5112 Encounter for antineoplastic immunotherapy: Secondary | ICD-10-CM | POA: Insufficient documentation

## 2019-02-02 DIAGNOSIS — D509 Iron deficiency anemia, unspecified: Secondary | ICD-10-CM | POA: Insufficient documentation

## 2019-02-02 DIAGNOSIS — Z79899 Other long term (current) drug therapy: Secondary | ICD-10-CM | POA: Diagnosis not present

## 2019-02-02 DIAGNOSIS — Z7189 Other specified counseling: Secondary | ICD-10-CM

## 2019-02-02 MED ORDER — FAMOTIDINE IN NACL 20-0.9 MG/50ML-% IV SOLN
INTRAVENOUS | Status: AC
  Filled 2019-02-02: qty 50

## 2019-02-02 MED ORDER — SODIUM CHLORIDE 0.9 % IV SOLN
200.0000 mg | Freq: Once | INTRAVENOUS | Status: AC
  Administered 2019-02-02: 200 mg via INTRAVENOUS
  Filled 2019-02-02: qty 10

## 2019-02-02 MED ORDER — ACETAMINOPHEN 325 MG PO TABS
650.0000 mg | ORAL_TABLET | Freq: Once | ORAL | Status: AC
  Administered 2019-02-02: 650 mg via ORAL

## 2019-02-02 MED ORDER — DIPHENHYDRAMINE HCL 25 MG PO CAPS
25.0000 mg | ORAL_CAPSULE | Freq: Once | ORAL | Status: AC
  Administered 2019-02-02: 25 mg via ORAL

## 2019-02-02 MED ORDER — SODIUM CHLORIDE 0.9 % IV SOLN
Freq: Once | INTRAVENOUS | Status: AC
  Administered 2019-02-02: 15:00:00 via INTRAVENOUS
  Filled 2019-02-02: qty 250

## 2019-02-02 MED ORDER — METHYLPREDNISOLONE SODIUM SUCC 125 MG IJ SOLR
INTRAMUSCULAR | Status: AC
  Filled 2019-02-02: qty 2

## 2019-02-02 MED ORDER — DIPHENHYDRAMINE HCL 25 MG PO CAPS
ORAL_CAPSULE | ORAL | Status: AC
  Filled 2019-02-02: qty 1

## 2019-02-02 MED ORDER — ACETAMINOPHEN 325 MG PO TABS
ORAL_TABLET | ORAL | Status: AC
  Filled 2019-02-02: qty 2

## 2019-02-02 MED ORDER — METHYLPREDNISOLONE SODIUM SUCC 125 MG IJ SOLR
60.0000 mg | Freq: Once | INTRAMUSCULAR | Status: AC
  Administered 2019-02-02: 60 mg via INTRAVENOUS

## 2019-02-02 MED ORDER — FAMOTIDINE IN NACL 20-0.9 MG/50ML-% IV SOLN
20.0000 mg | Freq: Once | INTRAVENOUS | Status: AC
  Administered 2019-02-02: 20 mg via INTRAVENOUS

## 2019-02-02 NOTE — Progress Notes (Signed)
Met with patient whom brought proof of income for one-time $700 [place].  Patient approved for grant and received a copy of the approval letter as well as the expense sheet along with the Outpatient pharmacy information. Explained in detail expenses and how they are covered as well as submitted. She verbalized understanding. She received a gas card today.  Patient had several concerns regarding the cost of her treatment. After speaking with Rob regarding drug replacement, advised patient her Venofer was being handled through drug replacement which would be provided to her at no cost and that she should be receiving paperwork to sign wile in treatment. Also as far as the Rituxan, Rob will submit the paperwork to get this one replaced as well through drug replacement. Also discucssed other options such as applying for Medicaid and CHFAA after applying for Medicaid if needed for the remaining portion of the bill if needed once this arises.  Encouraged patient to contact me with any additional financial questions or concerns. She verbalized understanding and was very Patent attorney. She has my card.

## 2019-02-02 NOTE — Progress Notes (Unsigned)
Met with patient whom brought proof of income for one-time $700 [place].  Patient approved for grant and received a copy of the approval letter as well as the expense sheet along with the Outpatient pharmacy information. Explained in detail expenses and how they are covered as well as submitted. She verbalized understanding. She received a gas card today.  Patient had several concerns regarding the cost of her treatment. After speaking with Rob regarding drug replacement, advised patient her

## 2019-02-02 NOTE — Patient Instructions (Signed)
Iron Sucrose injection What is this medicine? IRON SUCROSE (AHY ern SOO krohs) is an iron complex. Iron is used to make healthy red blood cells, which carry oxygen and nutrients throughout the body. This medicine is used to treat iron deficiency anemia in people with chronic kidney disease. This medicine may be used for other purposes; ask your health care provider or pharmacist if you have questions. COMMON BRAND NAME(S): Venofer What should I tell my health care provider before I take this medicine? They need to know if you have any of these conditions:  anemia not caused by low iron levels  heart disease  high levels of iron in the blood  kidney disease  liver disease  an unusual or allergic reaction to iron, other medicines, foods, dyes, or preservatives  pregnant or trying to get pregnant  breast-feeding How should I use this medicine? This medicine is for infusion into a vein. It is given by a health care professional in a hospital or clinic setting. Talk to your pediatrician regarding the use of this medicine in children. While this drug may be prescribed for children as young as 2 years for selected conditions, precautions do apply. Overdosage: If you think you have taken too much of this medicine contact a poison control center or emergency room at once. NOTE: This medicine is only for you. Do not share this medicine with others. What if I miss a dose? It is important not to miss your dose. Call your doctor or health care professional if you are unable to keep an appointment. What may interact with this medicine? Do not take this medicine with any of the following medications:  deferoxamine  dimercaprol  other iron products This medicine may also interact with the following medications:  chloramphenicol  deferasirox This list may not describe all possible interactions. Give your health care provider a list of all the medicines, herbs, non-prescription drugs, or  dietary supplements you use. Also tell them if you smoke, drink alcohol, or use illegal drugs. Some items may interact with your medicine. What should I watch for while using this medicine? Visit your doctor or healthcare professional regularly. Tell your doctor or healthcare professional if your symptoms do not start to get better or if they get worse. You may need blood work done while you are taking this medicine. You may need to follow a special diet. Talk to your doctor. Foods that contain iron include: whole grains/cereals, dried fruits, beans, or peas, leafy green vegetables, and organ meats (liver, kidney). What side effects may I notice from receiving this medicine? Side effects that you should report to your doctor or health care professional as soon as possible:  allergic reactions like skin rash, itching or hives, swelling of the face, lips, or tongue  breathing problems  changes in blood pressure  cough  fast, irregular heartbeat  feeling faint or lightheaded, falls  fever or chills  flushing, sweating, or hot feelings  joint or muscle aches/pains  seizures  swelling of the ankles or feet  unusually weak or tired Side effects that usually do not require medical attention (report to your doctor or health care professional if they continue or are bothersome):  diarrhea  feeling achy  headache  irritation at site where injected  nausea, vomiting  stomach upset  tiredness This list may not describe all possible side effects. Call your doctor for medical advice about side effects. You may report side effects to FDA at 1-800-FDA-1088. Where should I keep   my medicine? This drug is given in a hospital or clinic and will not be stored at home. NOTE: This sheet is a summary. It may not cover all possible information. If you have questions about this medicine, talk to your doctor, pharmacist, or health care provider.  2020 Elsevier/Gold Standard (2011-03-28  17:14:35) Coronavirus (COVID-19) Are you at risk?  Are you at risk for the Coronavirus (COVID-19)?  To be considered HIGH RISK for Coronavirus (COVID-19), you have to meet the following criteria:  . Traveled to [place], [place], [place], [place] or [place]; or in the [place] to [place], [place], [place], or [place]; and have fever, cough, and shortness of breath within the last 2 weeks of travel OR . Been in close contact with a person diagnosed with COVID-19 within the last 2 weeks and have fever, cough, and shortness of breath . IF YOU DO NOT MEET THESE CRITERIA, YOU ARE CONSIDERED LOW RISK FOR COVID-19.  What to do if you are HIGH RISK for COVID-19?  . If you are having a medical emergency, call 911. . Seek medical care right away. Before you go to a doctor's office, urgent care or emergency department, call ahead and tell them about your recent travel, contact with someone diagnosed with COVID-19, and your symptoms. You should receive instructions from your physician's office regarding next steps of care.  . When you arrive at healthcare provider, tell the healthcare staff immediately you have returned from visiting [place], [place], [place], [place] or [place]; or traveled in the [place] to [place], [place], [place], or [place]; in the last two weeks or you have been in close contact with a person diagnosed with COVID-19 in the last 2 weeks.   . Tell the health care staff about your symptoms: fever, cough and shortness of breath. . After you have been seen by a medical provider, you will be either: o Tested for (COVID-19) and discharged home on quarantine except to seek medical care if symptoms worsen, and asked to  - Stay home and avoid contact with others until you get your results (4-5 days)  - Avoid travel on public transportation if possible (such as bus, train, or airplane) or o Sent to the Emergency Department by EMS for evaluation, COVID-19 testing, and  possible admission depending on your condition and test results.  What to do if you are LOW RISK for COVID-19?  Reduce your risk of any infection by using the same precautions used for avoiding the common cold or flu:  . Wash your hands often with soap and warm water for at least 20 seconds.  If soap and water are not readily available, use an alcohol-based hand sanitizer with at least 60% alcohol.  . If coughing or sneezing, cover your mouth and nose by coughing or sneezing into the elbow areas of your shirt or coat, into a tissue or into your sleeve (not your hands). . Avoid shaking hands with others and consider head nods or verbal greetings only. . Avoid touching your eyes, nose, or mouth with unwashed hands.  . Avoid close contact with people who are sick. . Avoid places or events with large numbers of people in one location, like concerts or sporting events. . Carefully consider travel plans you have or are making. . If you are planning any travel outside or inside the [place], visit the CDC's Travelers' Health webpage for the latest health notices. . If you have some symptoms but not all   symptoms, continue to monitor at home and seek medical attention if your symptoms worsen. . If you are having a medical emergency, call 911.   ADDITIONAL HEALTHCARE OPTIONS FOR PATIENTS  [facility] / e-Visit: [email or web address]         [facility]: [phone]  [facility]: [phone]                   [facility]: [phone]  

## 2019-02-08 ENCOUNTER — Inpatient Hospital Stay: Payer: 59

## 2019-02-08 ENCOUNTER — Inpatient Hospital Stay: Payer: 59 | Admitting: Hematology

## 2019-02-08 ENCOUNTER — Other Ambulatory Visit: Payer: Self-pay

## 2019-02-08 ENCOUNTER — Inpatient Hospital Stay (HOSPITAL_BASED_OUTPATIENT_CLINIC_OR_DEPARTMENT_OTHER): Payer: 59 | Admitting: Medical

## 2019-02-08 ENCOUNTER — Other Ambulatory Visit: Payer: Self-pay | Admitting: *Deleted

## 2019-02-08 VITALS — BP 100/71 | HR 88 | Temp 98.5°F | Resp 18

## 2019-02-08 DIAGNOSIS — T8090XA Unspecified complication following infusion and therapeutic injection, initial encounter: Secondary | ICD-10-CM

## 2019-02-08 DIAGNOSIS — C8108 Nodular lymphocyte predominant Hodgkin lymphoma, lymph nodes of multiple sites: Secondary | ICD-10-CM

## 2019-02-08 DIAGNOSIS — D500 Iron deficiency anemia secondary to blood loss (chronic): Secondary | ICD-10-CM

## 2019-02-08 DIAGNOSIS — Z7189 Other specified counseling: Secondary | ICD-10-CM

## 2019-02-08 DIAGNOSIS — Z5112 Encounter for antineoplastic immunotherapy: Secondary | ICD-10-CM | POA: Diagnosis not present

## 2019-02-08 LAB — CBC WITH DIFFERENTIAL (CANCER CENTER ONLY)
Abs Immature Granulocytes: 0.02 10*3/uL (ref 0.00–0.07)
Basophils Absolute: 0 10*3/uL (ref 0.0–0.1)
Basophils Relative: 1 %
Eosinophils Absolute: 0.2 10*3/uL (ref 0.0–0.5)
Eosinophils Relative: 4 %
HCT: 41.1 % (ref 36.0–46.0)
Hemoglobin: 12.5 g/dL (ref 12.0–15.0)
Immature Granulocytes: 0 %
Lymphocytes Relative: 24 %
Lymphs Abs: 1.4 10*3/uL (ref 0.7–4.0)
MCH: 22.9 pg — ABNORMAL LOW (ref 26.0–34.0)
MCHC: 30.4 g/dL (ref 30.0–36.0)
MCV: 75.3 fL — ABNORMAL LOW (ref 80.0–100.0)
Monocytes Absolute: 0.4 10*3/uL (ref 0.1–1.0)
Monocytes Relative: 7 %
Neutro Abs: 3.7 10*3/uL (ref 1.7–7.7)
Neutrophils Relative %: 64 %
Platelet Count: 246 10*3/uL (ref 150–400)
RBC: 5.46 MIL/uL — ABNORMAL HIGH (ref 3.87–5.11)
RDW: 28.1 % — ABNORMAL HIGH (ref 11.5–15.5)
WBC Count: 5.7 10*3/uL (ref 4.0–10.5)
nRBC: 0 % (ref 0.0–0.2)

## 2019-02-08 LAB — CMP (CANCER CENTER ONLY)
ALT: 20 U/L (ref 0–44)
AST: 21 U/L (ref 15–41)
Albumin: 4.4 g/dL (ref 3.5–5.0)
Alkaline Phosphatase: 53 U/L (ref 38–126)
Anion gap: 10 (ref 5–15)
BUN: 10 mg/dL (ref 6–20)
CO2: 21 mmol/L — ABNORMAL LOW (ref 22–32)
Calcium: 9.6 mg/dL (ref 8.9–10.3)
Chloride: 108 mmol/L (ref 98–111)
Creatinine: 0.85 mg/dL (ref 0.44–1.00)
GFR, Est AFR Am: >60 mL/min (ref >60)
GFR, Estimated: >60 mL/min (ref >60)
Glucose, Bld: 94 mg/dL (ref 70–99)
Potassium: 4.2 mmol/L (ref 3.5–5.1)
Sodium: 139 mmol/L (ref 135–145)
Total Bilirubin: 0.4 mg/dL (ref 0.3–1.2)
Total Protein: 7.3 g/dL (ref 6.5–8.1)

## 2019-02-08 MED ORDER — ACETAMINOPHEN 325 MG PO TABS
650.0000 mg | ORAL_TABLET | Freq: Once | ORAL | Status: AC
  Administered 2019-02-08: 650 mg via ORAL

## 2019-02-08 MED ORDER — DIPHENHYDRAMINE HCL 25 MG PO CAPS
ORAL_CAPSULE | ORAL | Status: AC
  Filled 2019-02-08: qty 2

## 2019-02-08 MED ORDER — ACETAMINOPHEN 325 MG PO TABS
ORAL_TABLET | ORAL | Status: AC
  Filled 2019-02-08: qty 2

## 2019-02-08 MED ORDER — SODIUM CHLORIDE 0.9 % IV SOLN
375.0000 mg/m2 | Freq: Once | INTRAVENOUS | Status: AC
  Administered 2019-02-08: 500 mg via INTRAVENOUS
  Filled 2019-02-08: qty 50

## 2019-02-08 MED ORDER — FAMOTIDINE IN NACL 20-0.9 MG/50ML-% IV SOLN
20.0000 mg | Freq: Once | INTRAVENOUS | Status: AC | PRN
  Administered 2019-02-08: 20 mg via INTRAVENOUS

## 2019-02-08 MED ORDER — SODIUM CHLORIDE 0.9 % IV SOLN
Freq: Once | INTRAVENOUS | Status: DC | PRN
  Administered 2019-02-08: 14:00:00 via INTRAVENOUS
  Filled 2019-02-08: qty 250

## 2019-02-08 MED ORDER — SODIUM CHLORIDE 0.9 % IV SOLN
Freq: Once | INTRAVENOUS | Status: AC
  Administered 2019-02-08: 12:00:00 via INTRAVENOUS
  Filled 2019-02-08: qty 250

## 2019-02-08 MED ORDER — METHYLPREDNISOLONE SODIUM SUCC 125 MG IJ SOLR
125.0000 mg | Freq: Once | INTRAMUSCULAR | Status: AC
  Administered 2019-02-08: 125 mg via INTRAVENOUS

## 2019-02-08 MED ORDER — FAMOTIDINE IN NACL 20-0.9 MG/50ML-% IV SOLN
INTRAVENOUS | Status: AC
  Filled 2019-02-08: qty 50

## 2019-02-08 MED ORDER — DIPHENHYDRAMINE HCL 25 MG PO CAPS
50.0000 mg | ORAL_CAPSULE | Freq: Once | ORAL | Status: AC
  Administered 2019-02-08: 50 mg via ORAL

## 2019-02-08 MED ORDER — DIPHENHYDRAMINE HCL 50 MG/ML IJ SOLN
25.0000 mg | Freq: Once | INTRAMUSCULAR | Status: AC
  Administered 2019-02-08: 25 mg via INTRAVENOUS

## 2019-02-08 MED ORDER — FAMOTIDINE IN NACL 20-0.9 MG/50ML-% IV SOLN
20.0000 mg | Freq: Once | INTRAVENOUS | Status: AC
  Administered 2019-02-08: 20 mg via INTRAVENOUS

## 2019-02-08 MED ORDER — METHYLPREDNISOLONE SODIUM SUCC 125 MG IJ SOLR
INTRAMUSCULAR | Status: AC
  Filled 2019-02-08: qty 2

## 2019-02-08 NOTE — Progress Notes (Signed)
At 1405 pt reported that her mouth & throat were itching slightly, Rituxan infusion stopped & disconnected.  NS bolus started.  Shelia Media PA in department & was notified.    At 1407 V. Tanner PA @ bedside.  Pt does not complain of any other sx's.  Pepcid given @ 1410.  Benadryl 25 mg given @ 1440, pt states she still itching in her mouth some, but less than before.  Pt's sx's have resolved, denies any itching, Rituxan restarted @ 1455 at 1/2 dose per V. Tanner PA.  Pt completed rest of infusion without incident.

## 2019-02-08 NOTE — Patient Instructions (Addendum)
[facility] Discharge Instructions for Patients Receiving Chemotherapy  Today you received the following chemotherapy agents: Rituxan  To help prevent nausea and vomiting after your treatment, we encourage you to take your nausea medication as directed.   If you develop nausea and vomiting that is not controlled by your nausea medication, call the clinic.   BELOW ARE SYMPTOMS THAT SHOULD BE REPORTED IMMEDIATELY:  *FEVER GREATER THAN 100.5 F  *CHILLS WITH OR WITHOUT FEVER  NAUSEA AND VOMITING THAT IS NOT CONTROLLED WITH YOUR NAUSEA MEDICATION  *UNUSUAL SHORTNESS OF BREATH  *UNUSUAL BRUISING OR BLEEDING  TENDERNESS IN MOUTH AND THROAT WITH OR WITHOUT PRESENCE OF ULCERS  *URINARY PROBLEMS  *BOWEL PROBLEMS  UNUSUAL RASH Items with * indicate a potential emergency and should be followed up as soon as possible.  Feel free to call the clinic should you have any questions or concerns. The clinic phone number is [phone].  Please show the CHEMO ALERT CARD at check-in to the Emergency Department and triage nurse.  Rituximab injection What is this medicine? RITUXIMAB (ri TUX i mab) is a monoclonal antibody. It is used to treat certain types of cancer like non-Hodgkin lymphoma and chronic lymphocytic leukemia. It is also used to treat rheumatoid arthritis, granulomatosis with polyangiitis (or Wegener's granulomatosis), microscopic polyangiitis, and pemphigus vulgaris. This medicine may be used for other purposes; ask your health care provider or pharmacist if you have questions. COMMON BRAND NAME(S): Rituxan, RUXIENCE What should I tell my health care provider before I take this medicine? They need to know if you have any of these conditions:  heart disease  infection (especially a virus infection such as hepatitis B, chickenpox, cold sores, or herpes)  immune system problems  irregular heartbeat  kidney disease  low blood counts, like low white cell,  platelet, or red cell counts  lung or breathing disease, like asthma  recently received or scheduled to receive a vaccine  an unusual or allergic reaction to rituximab, other medicines, foods, dyes, or preservatives  pregnant or trying to get pregnant  breast-feeding How should I use this medicine? This medicine is for infusion into a vein. It is administered in a hospital or clinic by a specially trained health care professional. A special MedGuide will be given to you by the pharmacist with each prescription and refill. Be sure to read this information carefully each time. Talk to your pediatrician regarding the use of this medicine in children. This medicine is not approved for use in children. Overdosage: If you think you have taken too much of this medicine contact a poison control center or emergency room at once. NOTE: This medicine is only for you. Do not share this medicine with others. What if I miss a dose? It is important not to miss a dose. Call your doctor or health care professional if you are unable to keep an appointment. What may interact with this medicine?  cisplatin  live virus vaccines This list may not describe all possible interactions. Give your health care provider a list of all the medicines, herbs, non-prescription drugs, or dietary supplements you use. Also tell them if you smoke, drink alcohol, or use illegal drugs. Some items may interact with your medicine. What should I watch for while using this medicine? Your condition will be monitored carefully while you are receiving this medicine. You may need blood work done while you are taking this medicine. This medicine can cause serious allergic reactions. To reduce your risk you may   need to take medicine before treatment with this medicine. Take your medicine as directed. In some patients, this medicine may cause a serious brain infection that may cause death. If you have any problems seeing, thinking,  speaking, walking, or standing, tell your healthcare professional right away. If you cannot reach your healthcare professional, urgently seek other source of medical care. Call your doctor or health care professional for advice if you get a fever, chills or sore throat, or other symptoms of a cold or flu. Do not treat yourself. This drug decreases your body's ability to fight infections. Try to avoid being around people who are sick. Do not become pregnant while taking this medicine or for at least 12 months after stopping it. Women should inform their doctor if they wish to become pregnant or think they might be pregnant. There is a potential for serious side effects to an unborn child. Talk to your health care professional or pharmacist for more information. Do not breast-feed an infant while taking this medicine or for at least 6 months after stopping it. What side effects may I notice from receiving this medicine? Side effects that you should report to your doctor or health care professional as soon as possible:  allergic reactions like skin rash, itching or hives; swelling of the face, lips, or tongue  breathing problems  chest pain  changes in vision  diarrhea  headache with fever, neck stiffness, sensitivity to light, nausea, or confusion  fast, irregular heartbeat  loss of memory  low blood counts - this medicine may decrease the number of white blood cells, red blood cells and platelets. You may be at increased risk for infections and bleeding.  mouth sores  problems with balance, talking, or walking  redness, blistering, peeling or loosening of the skin, including inside the mouth  signs of infection - fever or chills, cough, sore throat, pain or difficulty passing urine  signs and symptoms of kidney injury like trouble passing urine or change in the amount of urine  signs and symptoms of liver injury like dark yellow or brown urine; general ill feeling or flu-like  symptoms; light-colored stools; loss of appetite; nausea; right upper belly pain; unusually weak or tired; yellowing of the eyes or skin  signs and symptoms of low blood pressure like dizziness; feeling faint or lightheaded, falls; unusually weak or tired  stomach pain  swelling of the ankles, feet, hands  unusual bleeding or bruising  vomiting Side effects that usually do not require medical attention (report to your doctor or health care professional if they continue or are bothersome):  headache  joint pain  muscle cramps or muscle pain  nausea  tiredness This list may not describe all possible side effects. Call your doctor for medical advice about side effects. You may report side effects to FDA at 1-800-FDA-1088. Where should I keep my medicine? This drug is given in a hospital or clinic and will not be stored at home. NOTE: This sheet is a summary. It may not cover all possible information. If you have questions about this medicine, talk to your doctor, pharmacist, or health care provider.  2020 Elsevier/Gold Standard (2018-07-29 22:01:36)

## 2019-02-08 NOTE — Progress Notes (Signed)
    DATE:  02/08/2019                                          X  CHEMO/IMMUNOTHERAPY REACTION            MD:  Dr. Sullivan Lone   AGENT/BLOOD [place]:              Rituxan    AGENT/BLOOD PRODUCT RECEIVING IMMEDIATELY PRIOR TO REACTION:          Rituxan   VS:  BP:     107/72   P:       80       SPO2:       100% on room air                  REACTION(S):           oral itching    PREMEDS:     Pepcid 20 mg IV, Solumedrol 125 mg IV, Tylenol 650 mg and Benadryl 50 mg   INTERVENTION: Pepcid 20 mg IV x 1 and Benadryl 25 mg IV x 1   Review of Systems  Review of Systems  Constitutional: Negative for chills, diaphoresis and fever.  HENT: Negative for trouble swallowing and voice change.        Oral itching  Respiratory: Negative for cough, chest tightness, shortness of breath and wheezing.   Cardiovascular: Negative for chest pain and palpitations.  Gastrointestinal: Negative for abdominal pain, constipation, diarrhea, nausea and vomiting.  Musculoskeletal: Negative for back pain and myalgias.  Neurological: Negative for dizziness, light-headedness and headaches.     Physical Exam  Physical Exam Constitutional:      General: She is not in acute distress.    Appearance: She is not diaphoretic.  HENT:     Head: Normocephalic and atraumatic.  Cardiovascular:     Rate and Rhythm: Normal rate and regular rhythm.     Heart sounds: Normal heart sounds. No murmur. No friction rub. No gallop.   Pulmonary:     Effort: Pulmonary effort is normal. No respiratory distress.     Breath sounds: Normal breath sounds. No wheezing or rales.  Skin:    General: Skin is warm and dry.     Findings: No erythema or rash.  Neurological:     Mental Status: She is alert.     OUTCOME:             Rituxan was paused. Her symptoms resolved after dosing with Pepcid and Benadryl. She was able to restart her treatment.      Sandi Mealy, MHS, PA-C

## 2019-02-09 ENCOUNTER — Telehealth: Payer: Self-pay | Admitting: *Deleted

## 2019-02-12 ENCOUNTER — Other Ambulatory Visit: Payer: Self-pay

## 2019-02-12 DIAGNOSIS — C8108 Nodular lymphocyte predominant Hodgkin lymphoma, lymph nodes of multiple sites: Secondary | ICD-10-CM

## 2019-02-14 NOTE — Progress Notes (Signed)
HEMATOLOGY/ONCOLOGY CONSULTATION NOTE  Date of Service: 02/14/2019  Patient Care Team: [name], PA as PCP - General (Family Medicine)   CHIEF COMPLAINTS/PURPOSE OF CONSULTATION: Lymphocyte predominant hodgkins lymphoma   HISTORY OF PRESENTING ILLNESS:  Leah Gould is a wonderful 23 y.o. female who has been referred to [place] by Dr. Fanny Skates for evaluation and management of her newly diagnosed Lymphoproliferative disorder. The pt reports that she is doing well overall. She is accompanied today by her Mom, via [employer].  The pt notes that she first noticed her enlarged left inguinal lymph node in 2010, and notes that it was "really tiny." She presented to an urgent care initially at the time. She is unsure if the lymph node reduced in size.  The pt notes that she noticed her left inguinal lymph node being larger and more sensitive last summer 2019 after a dog jumped on her. She notes that after this, the lymph node continued growing in size. She notes that the lymph node began causing pain in February 2020. She notes that this lymph node would increase in size in correlation with her periods. The pt notes that she made an appointment to see Ellen Henri, [place] at [facility], who referred her to surgeon Dr. Dalbert Batman.   The pt denies noticing any other new lumps or bumps and denies fevers, chills, night sweats or unexpected weight loss. The pt notes that she has had some new SOB, which she notes improves after eating ice, and has occurred more frequently recently. She endorses ice picca symptoms. She feels that she has to take deep breaths at night when she "settles down."  She does continue to feel a sense of swelling in her left groin.  The pt also endorses some discomfort in her right, lower abdomen. She was seen to have a thickened bladder and right sided ovarian cyst on the 11/17/18 CT A/P, as noted below. She denies a history of kidney stones or UTIs. She also notes that  she has felt some discomfort since her miscarriage in late April.  The pt denies using any steroids recently. The pt reports that she has had stable asthma and uses an inhaler as needed, not used frequently, endorses seasonal correlation. The pt also notes that she has been anemic since 2010, thought to be related to iron deficiency and endorses heavier periods. She notes that her periods can last up to 7-8 days which is her baseline. She has been taking PO Iron replacement for the last two months, denies other previous iron replacement. She has never had a blood transfusion.   The pt notes that she was pregnant once before and had a miscarriage about two months ago. She notes this occurred around 8-[redacted] weeks gestation, not followed by D&C.   The pt denies past surgeries and denies any medication allergies. She is not taking any chronic medications.  Of note prior to the patient's visit today, pt has had a CT A/P completed on 11/17/18 with results revealing "Enlarged presumed lymph node in the left inguinal region measuring 5.2 x 3.9 x 3.8 cm. There is enhancement of this lymph node. Etiology for this localized lymph node prominence is uncertain. Tissue sampling may well be warranted given this finding. No other adenopathy noted in the abdomen or pelvis. 2.  Spleen does not appear appreciably enlarged. 3. Wall thickening in the urinary bladder. Suspect a degree of cystitis. No renal or ureteral calculus. No hydronephrosis. 4. Apparent recent ovarian cyst  rupture on the right with mild fluid tracking from the right adnexa and cul-de-sac. 5. No evident bowel obstruction or bowel wall thickening. No abscess in the abdomen pelvis. No periappendiceal region inflammatory change."  Most recent lab results (623/20) of CBC w/diff and CMP is as follows: all values are WNL except for WBC at 3.5k, HGB at 9.4, HCT at 32.5, MCV at 65.7, MCH at 19.0, MCHC at 28.9, RDW at 21.3, ANC at 1.5k, Potassium at 3.3.  On review of  systems, pt reports some SOB, discomfort in right lower abdomen, left inguinal swelling, and denies fevers, chills, night sweats, unexpected weight loss, noticing any other lumps or bumps, mouth soreness, leg swelling, skin rashes, itching, and any other symptoms.   On PMHx the pt reports asthma and iron deficiency anemia. One pregnancy with miscarriage at 8-9 weeks. On Social Hx the pt denies smoking cigarettes and denies alcohol consumption. She works at [employer] as a Scientist, water quality. On Family Hx the pt denies blood disorders nor cancers.   INTERVAL HISTORY:  Leah Gould is here today for follow up and treatment of her Lymphoproliferative Disorder. The patient's last visit with [place] was on 01/19/2019. The pt reports that she is doing well overall.  The pt reports that her mouth and throat became itchy 20-30 minutes into her first Rituxan treatment. Her saliva also started getting thick. After she was given Pepcid and Benadryl, she was able to complete her treatment. After going home, she did not experience any more symptoms.  The pt also notes that after she eats, her stomach starts to hurt and she immediately has to use the bathroom. This has been going on all week. Her most recent period just ended and was not heavy. She notes that her OBGYN prescribed BCPs to help with her bleeding. Denies breathing problems.  Lab results today (02/15/2019) of CBC w/diff and CMP is as follows: all values are WNL except for RBC at 5.23, MCV at 76.1, MCH at 23.5, total bilirubin at <0.2.  02/15/2019 CMP is pending  On review of systems, pt reports upset stomach, mild reaction to Rituxan, and denies breathing problems and any other symptoms.   MEDICAL HISTORY:  Past Medical History:  Diagnosis Date  . Anemia   . Asthma    no meds, allergy induced  . Inguinal adenopathy 12/22/2018    SURGICAL HISTORY: Past Surgical History:  Procedure Laterality Date  . MASS EXCISION Left 12/22/2018   Procedure: EXCISION  DEEP LEFT INGUINAL LYMPH NODE;  Surgeon: Fanny Skates, MD;  Location: [place];  Service: General;  Laterality: Left;    SOCIAL HISTORY: Social History   Socioeconomic History  . Marital status: Single    Spouse name: Not on file  . Number of children: Not on file  . Years of education: Not on file  . Highest education level: Not on file  Occupational History  . Not on file  Social Needs  . Financial resource strain: Not on file  . Food insecurity    Worry: Not on file    Inability: Not on file  . Transportation needs    Medical: Not on file    Non-medical: Not on file  Tobacco Use  . Smoking status: Never Smoker  . Smokeless tobacco: Never Used  Substance and Sexual Activity  . Alcohol use: No  . Drug use: No  . Sexual activity: Yes    Birth control/protection: None  Lifestyle  . Physical activity    Days  per week: Not on file    Minutes per session: Not on file  . Stress: Not on file  Relationships  . Social Herbalist on phone: Not on file    Gets together: Not on file    Attends religious service: Not on file    Active member of club or organization: Not on file    Attends meetings of clubs or organizations: Not on file    Relationship status: Not on file  . Intimate partner violence    Fear of current or ex partner: Not on file    Emotionally abused: Not on file    Physically abused: Not on file    Forced sexual activity: Not on file  Other Topics Concern  . Not on file  Social History Narrative  . Not on file    FAMILY HISTORY: No family history on file.  ALLERGIES:  is allergic to fruit & vegetable daily [nutritional supplements] and peanut-containing drug products.  MEDICATIONS:  Current Outpatient Medications  Medication Sig Dispense Refill  . HYDROcodone-acetaminophen (NORCO) 5-325 MG tablet Take 1-2 tablets by mouth every 6 (six) hours as needed for moderate pain or severe pain. (Patient not taking: Reported on  01/13/2019) 20 tablet 0   No current facility-administered medications for this visit.     REVIEW OF SYSTEMS:    A 10+ POINT REVIEW OF SYSTEMS WAS OBTAINED including neurology, dermatology, psychiatry, cardiac, respiratory, lymph, extremities, GI, GU, Musculoskeletal, constitutional, breasts, reproductive, HEENT.  All pertinent positives are noted in the HPI.  All others are negative.   PHYSICAL EXAMINATION: ECOG PERFORMANCE STATUS: 0 - Asymptomatic  There were no vitals filed for this visit. There were no vitals filed for this visit. There is no height or weight on file to calculate BMI.  GENERAL:alert, in no acute distress and comfortable SKIN: no acute rashes, no significant lesions EYES: conjunctiva are pink and non-injected, sclera anicteric OROPHARYNX: MMM, no exudates, no oropharyngeal erythema or ulceration NECK: supple, no JVD LYMPH:  No palpable lymphadenopathy in the cervical or axillary regions. LUNGS: clear to auscultation b/l with normal respiratory effort HEART: regular rate & rhythm ABDOMEN:  normoactive bowel sounds, no tenderness, not distended. No palpable hepatosplenomegaly. Extremity: no pedal edema PSYCH: alert & oriented x 3 with fluent speech NEURO: no focal motor/sensory deficits   LABORATORY DATA:  I have reviewed the data as listed  CBC Latest Ref Rng & Units 02/08/2019 01/05/2019 12/22/2018  WBC 4.0 - 10.5 K/uL 5.7 3.9(L) 3.5(L)  Hemoglobin 12.0 - 15.0 g/dL 12.5 8.9(L) 9.4(L)  Hematocrit 36.0 - 46.0 % 41.1 31.1(L) 32.5(L)  Platelets 150 - 400 K/uL 246 270 308   CMP Latest Ref Rng & Units 02/08/2019 01/05/2019 12/22/2018  Glucose 70 - 99 mg/dL 94 93 93  BUN 6 - 20 mg/dL 10 7 11   Creatinine 0.44 - 1.00 mg/dL 0.85 0.79 0.83  Sodium 135 - 145 mmol/L 139 138 138  Potassium 3.5 - 5.1 mmol/L 4.2 3.8 3.3(L)  Chloride 98 - 111 mmol/L 108 106 104  CO2 22 - 32 mmol/L 21(L) 24 24  Calcium 8.9 - 10.3 mg/dL 9.6 9.5 9.6  Total Protein 6.5 - 8.1 g/dL 7.3 7.8 7.5   Total Bilirubin 0.3 - 1.2 mg/dL 0.4 0.4 0.4  Alkaline Phos 38 - 126 U/L 53 49 49  AST 15 - 41 U/L 21 21 29   ALT 0 - 44 U/L 20 12 18     01/01/2019 Lymph Node Biopsy ([id number])  12/22/18 Left Inguinal LN Biopsy:     RADIOGRAPHIC STUDIES: I have personally reviewed the radiological images as listed and agreed with the findings in the report. No results found.  ASSESSMENT & PLAN:  Leah Gould is a 23 y.o. black female with  1. 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 The sample was sent for a second opinion and is awaiting further characterization for confirmation.  11/17/18 CT A/P which revealed "Enlarged presumed lymph node in the left inguinal region measuring 5.2 x 3.9 x 3.8 cm. There is enhancement of this lymph node. Etiology for this localized lymph node prominence is uncertain. Tissue sampling may well be warranted given this finding. No other adenopathy noted in the abdomen or pelvis. 2.  Spleen does not appear appreciably enlarged. 3. Wall thickening in the urinary bladder. Suspect a degree of cystitis. No renal or ureteral calculus. No hydronephrosis. 4. Apparent recent ovarian cyst rupture on the right with mild fluid tracking from the right adnexa and cul-de-sac. 5. No evident bowel obstruction or bowel wall thickening. No abscess in the abdomen pelvis. No periappendiceal region inflammatory change."  01/11/2019 PET scan revealed "Bilateral axillary and right subpectoral adenopathy noted with Deauville 5 disease on the left and Deauville 4 disease on the right in this region. Diffuse uniform endometrial activity with SUV of 13.2. Although this is very high for physiologic endometrial  activity, the diffuse uniform distribution is more characteristic of benign physiologic endometrial activity rather than malignancy. If the patient has abnormal uterine bleeding or if otherwise clinically warranted, pelvic sonography could be utilized for further characterization of the endometrium. Postoperative findings and low-grade metabolic activity in the vicinity of the excision biopsy of the left inguinal lymph node. No other hypermetabolic adenopathy in the abdomen/pelvis identified"  01/01/2019 biopsy sampling ([id number]) from the [place] shows lymphocyte predominant Hodgkin's lymphoma.  2. Iron deficiency anemia - hgb improved to 12.5 from 8.9 with IV iron.   PLAN: -Discussed pt labwork today, 02/15/2019; blood chemistries are stable, RBC at 5.23 -Rx acid suppressant daily and nausea medicine prn -Will look at iron levels in 2 months. Will hold off on PO iron at this time. -Discussed that it is okay for the patient to begin BCPs prescribed by her OBGYN. Advised the pt to stay hydrated and active to decrease the risk of blood clots. -Will not pursue rapid Rituxan given the pt's mild allergic reaction to her first dose. Will adjust pre-medications -- add Singulair, change benadryl to IV, increase Pepcid to 40 mg -Plan to repeat PET scan in 2-3 months following last dose of Rituxan    -F/u for 3rd dose of Rituxan as scheduled on 8/24 with labs -May cancel MD visit on 8/24 -please schedule 4th dose of Rituxan in 2 weeks with labs and MD visit   All of the patients questions were answered with apparent satisfaction. The patient knows to call the clinic with any problems, questions or concerns.  The total time spent in the appt was 20 minutes and more than 50% was on counseling and direct patient cares.  Sullivan Lone MD [place] AAHIVMS [facility] Hematology/Oncology Physician [facility]  (Office):       [phone] (Work cell):  [phone] (Fax):  [phone]  02/14/2019 3:11 PM   I, De Burrs, am acting as a scribe for Dr. Irene Limbo  .I have reviewed the above documentation for accuracy and completeness, and I agree with the above. Brunetta Genera MD

## 2019-02-14 NOTE — Progress Notes (Signed)
This encounter was created in error - please disregard.

## 2019-02-15 ENCOUNTER — Inpatient Hospital Stay: Payer: 59

## 2019-02-15 ENCOUNTER — Inpatient Hospital Stay (HOSPITAL_BASED_OUTPATIENT_CLINIC_OR_DEPARTMENT_OTHER): Payer: 59 | Admitting: Hematology

## 2019-02-15 ENCOUNTER — Telehealth: Payer: Self-pay | Admitting: Hematology

## 2019-02-15 ENCOUNTER — Other Ambulatory Visit: Payer: Self-pay

## 2019-02-15 VITALS — BP 104/65 | HR 65 | Temp 98.2°F | Resp 18

## 2019-02-15 VITALS — BP 111/78 | HR 69 | Temp 98.5°F | Resp 18 | Ht 63.0 in | Wt 98.6 lb

## 2019-02-15 DIAGNOSIS — Z7189 Other specified counseling: Secondary | ICD-10-CM

## 2019-02-15 DIAGNOSIS — C8108 Nodular lymphocyte predominant Hodgkin lymphoma, lymph nodes of multiple sites: Secondary | ICD-10-CM

## 2019-02-15 DIAGNOSIS — Z298 Encounter for other specified prophylactic measures: Secondary | ICD-10-CM | POA: Diagnosis not present

## 2019-02-15 DIAGNOSIS — Z5112 Encounter for antineoplastic immunotherapy: Secondary | ICD-10-CM | POA: Diagnosis not present

## 2019-02-15 LAB — CMP (CANCER CENTER ONLY)
ALT: 19 U/L (ref 0–44)
AST: 18 U/L (ref 15–41)
Albumin: 4 g/dL (ref 3.5–5.0)
Alkaline Phosphatase: 52 U/L (ref 38–126)
Anion gap: 9 (ref 5–15)
BUN: 14 mg/dL (ref 6–20)
CO2: 25 mmol/L (ref 22–32)
Calcium: 9.5 mg/dL (ref 8.9–10.3)
Chloride: 104 mmol/L (ref 98–111)
Creatinine: 0.93 mg/dL (ref 0.44–1.00)
GFR, Est AFR Am: >60 mL/min (ref >60)
GFR, Estimated: >60 mL/min (ref >60)
Glucose, Bld: 90 mg/dL (ref 70–99)
Potassium: 3.7 mmol/L (ref 3.5–5.1)
Sodium: 138 mmol/L (ref 135–145)
Total Bilirubin: <0.2 mg/dL — ABNORMAL LOW (ref 0.3–1.2)
Total Protein: 7 g/dL (ref 6.5–8.1)

## 2019-02-15 LAB — CBC WITH DIFFERENTIAL (CANCER CENTER ONLY)
Abs Immature Granulocytes: 0.01 10*3/uL (ref 0.00–0.07)
Basophils Absolute: 0.1 10*3/uL (ref 0.0–0.1)
Basophils Relative: 1 %
Eosinophils Absolute: 0.2 10*3/uL (ref 0.0–0.5)
Eosinophils Relative: 5 %
HCT: 39.8 % (ref 36.0–46.0)
Hemoglobin: 12.3 g/dL (ref 12.0–15.0)
Immature Granulocytes: 0 %
Lymphocytes Relative: 33 %
Lymphs Abs: 1.5 10*3/uL (ref 0.7–4.0)
MCH: 23.5 pg — ABNORMAL LOW (ref 26.0–34.0)
MCHC: 30.9 g/dL (ref 30.0–36.0)
MCV: 76.1 fL — ABNORMAL LOW (ref 80.0–100.0)
Monocytes Absolute: 0.4 10*3/uL (ref 0.1–1.0)
Monocytes Relative: 8 %
Neutro Abs: 2.3 10*3/uL (ref 1.7–7.7)
Neutrophils Relative %: 53 %
Platelet Count: 215 10*3/uL (ref 150–400)
RBC: 5.23 MIL/uL — ABNORMAL HIGH (ref 3.87–5.11)
WBC Count: 4.4 10*3/uL (ref 4.0–10.5)
nRBC: 0 % (ref 0.0–0.2)

## 2019-02-15 MED ORDER — ACETAMINOPHEN 325 MG PO TABS
650.0000 mg | ORAL_TABLET | Freq: Once | ORAL | Status: AC
  Administered 2019-02-15: 650 mg via ORAL

## 2019-02-15 MED ORDER — MONTELUKAST SODIUM 10 MG PO TABS
10.0000 mg | ORAL_TABLET | Freq: Once | ORAL | Status: AC
  Administered 2019-02-15: 10 mg via ORAL

## 2019-02-15 MED ORDER — DIPHENHYDRAMINE HCL 50 MG/ML IJ SOLN
INTRAMUSCULAR | Status: AC
  Filled 2019-02-15: qty 1

## 2019-02-15 MED ORDER — SODIUM CHLORIDE 0.9 % IV SOLN
40.0000 mg | Freq: Once | INTRAVENOUS | Status: AC
  Administered 2019-02-15: 40 mg via INTRAVENOUS
  Filled 2019-02-15: qty 4

## 2019-02-15 MED ORDER — METHYLPREDNISOLONE SODIUM SUCC 125 MG IJ SOLR
125.0000 mg | Freq: Once | INTRAMUSCULAR | Status: AC
  Administered 2019-02-15: 125 mg via INTRAVENOUS

## 2019-02-15 MED ORDER — MONTELUKAST SODIUM 10 MG PO TABS
ORAL_TABLET | ORAL | Status: AC
  Filled 2019-02-15: qty 1

## 2019-02-15 MED ORDER — SODIUM CHLORIDE 0.9 % IV SOLN
375.0000 mg/m2 | Freq: Once | INTRAVENOUS | Status: AC
  Administered 2019-02-15: 500 mg via INTRAVENOUS
  Filled 2019-02-15: qty 50

## 2019-02-15 MED ORDER — DIPHENHYDRAMINE HCL 50 MG/ML IJ SOLN
50.0000 mg | Freq: Once | INTRAMUSCULAR | Status: AC
  Administered 2019-02-15: 50 mg via INTRAVENOUS

## 2019-02-15 MED ORDER — SODIUM CHLORIDE 0.9 % IV SOLN
Freq: Once | INTRAVENOUS | Status: AC
  Administered 2019-02-15: 11:00:00 via INTRAVENOUS
  Filled 2019-02-15: qty 250

## 2019-02-15 MED ORDER — METHYLPREDNISOLONE SODIUM SUCC 125 MG IJ SOLR
INTRAMUSCULAR | Status: AC
  Filled 2019-02-15: qty 2

## 2019-02-15 MED ORDER — ACETAMINOPHEN 325 MG PO TABS
ORAL_TABLET | ORAL | Status: AC
  Filled 2019-02-15: qty 2

## 2019-02-15 NOTE — Progress Notes (Signed)
Today's infusion given at 1st infusion rate w/ additional premeds. Pt tolerated infusion well.

## 2019-02-15 NOTE — Telephone Encounter (Signed)
Scheduled appt per 8/17 los. °

## 2019-02-15 NOTE — Patient Instructions (Signed)
[facility] Discharge Instructions for Patients Receiving Chemotherapy  Today you received the following chemotherapy agents: Rituxan  To help prevent nausea and vomiting after your treatment, we encourage you to take your nausea medication as directed.   If you develop nausea and vomiting that is not controlled by your nausea medication, call the clinic.   BELOW ARE SYMPTOMS THAT SHOULD BE REPORTED IMMEDIATELY:  *FEVER GREATER THAN 100.5 F  *CHILLS WITH OR WITHOUT FEVER  NAUSEA AND VOMITING THAT IS NOT CONTROLLED WITH YOUR NAUSEA MEDICATION  *UNUSUAL SHORTNESS OF BREATH  *UNUSUAL BRUISING OR BLEEDING  TENDERNESS IN MOUTH AND THROAT WITH OR WITHOUT PRESENCE OF ULCERS  *URINARY PROBLEMS  *BOWEL PROBLEMS  UNUSUAL RASH Items with * indicate a potential emergency and should be followed up as soon as possible.  Feel free to call the clinic should you have any questions or concerns. The clinic phone number is [phone].  Please show the CHEMO ALERT CARD at check-in to the Emergency Department and triage nurse.  Rituximab injection What is this medicine? RITUXIMAB (ri TUX i mab) is a monoclonal antibody. It is used to treat certain types of cancer like non-Hodgkin lymphoma and chronic lymphocytic leukemia. It is also used to treat rheumatoid arthritis, granulomatosis with polyangiitis (or Wegener's granulomatosis), microscopic polyangiitis, and pemphigus vulgaris. This medicine may be used for other purposes; ask your health care provider or pharmacist if you have questions. COMMON BRAND NAME(S): Rituxan, RUXIENCE What should I tell my health care provider before I take this medicine? They need to know if you have any of these conditions:  heart disease  infection (especially a virus infection such as hepatitis B, chickenpox, cold sores, or herpes)  immune system problems  irregular heartbeat  kidney disease  low blood counts, like low white cell,  platelet, or red cell counts  lung or breathing disease, like asthma  recently received or scheduled to receive a vaccine  an unusual or allergic reaction to rituximab, other medicines, foods, dyes, or preservatives  pregnant or trying to get pregnant  breast-feeding How should I use this medicine? This medicine is for infusion into a vein. It is administered in a hospital or clinic by a specially trained health care professional. A special MedGuide will be given to you by the pharmacist with each prescription and refill. Be sure to read this information carefully each time. Talk to your pediatrician regarding the use of this medicine in children. This medicine is not approved for use in children. Overdosage: If you think you have taken too much of this medicine contact a poison control center or emergency room at once. NOTE: This medicine is only for you. Do not share this medicine with others. What if I miss a dose? It is important not to miss a dose. Call your doctor or health care professional if you are unable to keep an appointment. What may interact with this medicine?  cisplatin  live virus vaccines This list may not describe all possible interactions. Give your health care provider a list of all the medicines, herbs, non-prescription drugs, or dietary supplements you use. Also tell them if you smoke, drink alcohol, or use illegal drugs. Some items may interact with your medicine. What should I watch for while using this medicine? Your condition will be monitored carefully while you are receiving this medicine. You may need blood work done while you are taking this medicine. This medicine can cause serious allergic reactions. To reduce your risk you may   need to take medicine before treatment with this medicine. Take your medicine as directed. In some patients, this medicine may cause a serious brain infection that may cause death. If you have any problems seeing, thinking,  speaking, walking, or standing, tell your healthcare professional right away. If you cannot reach your healthcare professional, urgently seek other source of medical care. Call your doctor or health care professional for advice if you get a fever, chills or sore throat, or other symptoms of a cold or flu. Do not treat yourself. This drug decreases your body's ability to fight infections. Try to avoid being around people who are sick. Do not become pregnant while taking this medicine or for at least 12 months after stopping it. Women should inform their doctor if they wish to become pregnant or think they might be pregnant. There is a potential for serious side effects to an unborn child. Talk to your health care professional or pharmacist for more information. Do not breast-feed an infant while taking this medicine or for at least 6 months after stopping it. What side effects may I notice from receiving this medicine? Side effects that you should report to your doctor or health care professional as soon as possible:  allergic reactions like skin rash, itching or hives; swelling of the face, lips, or tongue  breathing problems  chest pain  changes in vision  diarrhea  headache with fever, neck stiffness, sensitivity to light, nausea, or confusion  fast, irregular heartbeat  loss of memory  low blood counts - this medicine may decrease the number of white blood cells, red blood cells and platelets. You may be at increased risk for infections and bleeding.  mouth sores  problems with balance, talking, or walking  redness, blistering, peeling or loosening of the skin, including inside the mouth  signs of infection - fever or chills, cough, sore throat, pain or difficulty passing urine  signs and symptoms of kidney injury like trouble passing urine or change in the amount of urine  signs and symptoms of liver injury like dark yellow or brown urine; general ill feeling or flu-like  symptoms; light-colored stools; loss of appetite; nausea; right upper belly pain; unusually weak or tired; yellowing of the eyes or skin  signs and symptoms of low blood pressure like dizziness; feeling faint or lightheaded, falls; unusually weak or tired  stomach pain  swelling of the ankles, feet, hands  unusual bleeding or bruising  vomiting Side effects that usually do not require medical attention (report to your doctor or health care professional if they continue or are bothersome):  headache  joint pain  muscle cramps or muscle pain  nausea  tiredness This list may not describe all possible side effects. Call your doctor for medical advice about side effects. You may report side effects to FDA at 1-800-FDA-1088. Where should I keep my medicine? This drug is given in a hospital or clinic and will not be stored at home. NOTE: This sheet is a summary. It may not cover all possible information. If you have questions about this medicine, talk to your doctor, pharmacist, or health care provider.  2020 Elsevier/Gold Standard (2018-07-29 22:01:36)

## 2019-02-15 NOTE — Progress Notes (Signed)
Rapid Infusion Rituximab Pharmacist Evaluation  Patients may be eligible for Rapid Infusion Rituximab (RIR) if they have no significant cardiac disease, no risk for Tumor Lysis Syndrome (TLS), received rituximab within the last 6 months, and tolerated those infusions per standard protocol without grade 3-4 infusion reactions. A pharmacist has verified the patient tolerated rituximab infusions per the [facility] standard infusion protocol without grade 3-4 infusion reactions. The treatment plan will be updated to reflect RIR if the patient qualifies per the checklist below.   Leah Gould is a 23 y.o. female being treated with rituximab for lymphoma. This patient may not be considered for RIR.    Age > 37 years old Yes   Stable renal, hepatic, and hematologic function Yes   Recent Pertinent Lab Values  Lab Results  Component Value Date   CREATININE 0.93 02/15/2019   BILITOT <0.2 (L) 02/15/2019   Lab Results  Component Value Date   WBC 4.4 02/15/2019   LYMPHSABS 1.5 02/15/2019   PLT 215 02/15/2019     Prior documented reaction to rituximab Yes   Previous rituximab infusion within 6 months Yes   Physician approval of RIR No   Treatment Plan updated orders to reflect RIR No     Leah Gould 02/15/19 11:24 AM

## 2019-02-16 ENCOUNTER — Encounter: Payer: Self-pay | Admitting: Pharmacy Technician

## 2019-02-16 NOTE — Progress Notes (Signed)
The patient is approved for drug assistance by [place] for Rituxan. Enrollment is effective until 02/13/21 and is based on insurance denial. Drug replacement will begin on DOS 02/08/19.

## 2019-02-19 ENCOUNTER — Other Ambulatory Visit: Payer: Self-pay | Admitting: *Deleted

## 2019-02-19 DIAGNOSIS — C8108 Nodular lymphocyte predominant Hodgkin lymphoma, lymph nodes of multiple sites: Secondary | ICD-10-CM

## 2019-02-22 ENCOUNTER — Inpatient Hospital Stay: Payer: 59

## 2019-02-22 ENCOUNTER — Ambulatory Visit: Payer: 59 | Admitting: Hematology

## 2019-02-22 ENCOUNTER — Other Ambulatory Visit: Payer: Self-pay

## 2019-02-22 VITALS — BP 105/68 | HR 81 | Temp 98.9°F | Resp 16 | Ht 63.0 in | Wt 97.8 lb

## 2019-02-22 DIAGNOSIS — C8108 Nodular lymphocyte predominant Hodgkin lymphoma, lymph nodes of multiple sites: Secondary | ICD-10-CM

## 2019-02-22 DIAGNOSIS — Z5112 Encounter for antineoplastic immunotherapy: Secondary | ICD-10-CM | POA: Diagnosis not present

## 2019-02-22 DIAGNOSIS — Z7189 Other specified counseling: Secondary | ICD-10-CM

## 2019-02-22 LAB — CBC WITH DIFFERENTIAL (CANCER CENTER ONLY)
Abs Immature Granulocytes: 0.01 10*3/uL (ref 0.00–0.07)
Basophils Absolute: 0.1 10*3/uL (ref 0.0–0.1)
Basophils Relative: 2 %
Eosinophils Absolute: 0.2 10*3/uL (ref 0.0–0.5)
Eosinophils Relative: 4 %
HCT: 43.3 % (ref 36.0–46.0)
Hemoglobin: 13.4 g/dL (ref 12.0–15.0)
Immature Granulocytes: 0 %
Lymphocytes Relative: 36 %
Lymphs Abs: 1.4 10*3/uL (ref 0.7–4.0)
MCH: 24.3 pg — ABNORMAL LOW (ref 26.0–34.0)
MCHC: 30.9 g/dL (ref 30.0–36.0)
MCV: 78.4 fL — ABNORMAL LOW (ref 80.0–100.0)
Monocytes Absolute: 0.3 10*3/uL (ref 0.1–1.0)
Monocytes Relative: 8 %
Neutro Abs: 1.9 10*3/uL (ref 1.7–7.7)
Neutrophils Relative %: 50 %
Platelet Count: 227 10*3/uL (ref 150–400)
RBC: 5.52 MIL/uL — ABNORMAL HIGH (ref 3.87–5.11)
WBC Count: 3.9 10*3/uL — ABNORMAL LOW (ref 4.0–10.5)
nRBC: 0 % (ref 0.0–0.2)

## 2019-02-22 LAB — CMP (CANCER CENTER ONLY)
ALT: 17 U/L (ref 0–44)
AST: 18 U/L (ref 15–41)
Albumin: 4.5 g/dL (ref 3.5–5.0)
Alkaline Phosphatase: 54 U/L (ref 38–126)
Anion gap: 8 (ref 5–15)
BUN: 10 mg/dL (ref 6–20)
CO2: 25 mmol/L (ref 22–32)
Calcium: 9.6 mg/dL (ref 8.9–10.3)
Chloride: 106 mmol/L (ref 98–111)
Creatinine: 0.79 mg/dL (ref 0.44–1.00)
GFR, Est AFR Am: >60 mL/min (ref >60)
GFR, Estimated: >60 mL/min (ref >60)
Glucose, Bld: 81 mg/dL (ref 70–99)
Potassium: 3.8 mmol/L (ref 3.5–5.1)
Sodium: 139 mmol/L (ref 135–145)
Total Bilirubin: 0.4 mg/dL (ref 0.3–1.2)
Total Protein: 7.5 g/dL (ref 6.5–8.1)

## 2019-02-22 MED ORDER — ACETAMINOPHEN 325 MG PO TABS
650.0000 mg | ORAL_TABLET | Freq: Once | ORAL | Status: AC
  Administered 2019-02-22: 650 mg via ORAL

## 2019-02-22 MED ORDER — ACETAMINOPHEN 325 MG PO TABS
ORAL_TABLET | ORAL | Status: AC
  Filled 2019-02-22: qty 2

## 2019-02-22 MED ORDER — DIPHENHYDRAMINE HCL 50 MG/ML IJ SOLN
INTRAMUSCULAR | Status: AC
  Filled 2019-02-22: qty 1

## 2019-02-22 MED ORDER — SODIUM CHLORIDE 0.9 % IV SOLN
40.0000 mg | Freq: Once | INTRAVENOUS | Status: AC
  Administered 2019-02-22: 40 mg via INTRAVENOUS
  Filled 2019-02-22: qty 4

## 2019-02-22 MED ORDER — MONTELUKAST SODIUM 10 MG PO TABS
10.0000 mg | ORAL_TABLET | Freq: Once | ORAL | Status: AC
  Administered 2019-02-22: 10 mg via ORAL

## 2019-02-22 MED ORDER — METHYLPREDNISOLONE SODIUM SUCC 125 MG IJ SOLR
INTRAMUSCULAR | Status: AC
  Filled 2019-02-22: qty 2

## 2019-02-22 MED ORDER — FAMOTIDINE IN NACL 20-0.9 MG/50ML-% IV SOLN
INTRAVENOUS | Status: AC
  Filled 2019-02-22: qty 100

## 2019-02-22 MED ORDER — METHYLPREDNISOLONE SODIUM SUCC 125 MG IJ SOLR
125.0000 mg | Freq: Once | INTRAMUSCULAR | Status: AC
  Administered 2019-02-22: 125 mg via INTRAVENOUS

## 2019-02-22 MED ORDER — SODIUM CHLORIDE 0.9 % IV SOLN
Freq: Once | INTRAVENOUS | Status: AC
  Administered 2019-02-22: 13:00:00 via INTRAVENOUS
  Filled 2019-02-22: qty 250

## 2019-02-22 MED ORDER — MONTELUKAST SODIUM 10 MG PO TABS
ORAL_TABLET | ORAL | Status: AC
  Filled 2019-02-22: qty 1

## 2019-02-22 MED ORDER — DIPHENHYDRAMINE HCL 50 MG/ML IJ SOLN
50.0000 mg | Freq: Once | INTRAMUSCULAR | Status: AC
  Administered 2019-02-22: 50 mg via INTRAVENOUS

## 2019-02-22 MED ORDER — SODIUM CHLORIDE 0.9 % IV SOLN
375.0000 mg/m2 | Freq: Once | INTRAVENOUS | Status: AC
  Administered 2019-02-22: 500 mg via INTRAVENOUS
  Filled 2019-02-22: qty 50

## 2019-02-22 NOTE — Patient Instructions (Signed)
Rituximab injection What is this medicine? RITUXIMAB (ri TUX i mab) is a monoclonal antibody. It is used to treat certain types of cancer like non-Hodgkin lymphoma and chronic lymphocytic leukemia. It is also used to treat rheumatoid arthritis, granulomatosis with polyangiitis (or Wegener's granulomatosis), microscopic polyangiitis, and pemphigus vulgaris. This medicine may be used for other purposes; ask your health care provider or pharmacist if you have questions. COMMON BRAND NAME(S): Rituxan, RUXIENCE What should I tell my health care provider before I take this medicine? They need to know if you have any of these conditions:  heart disease  infection (especially a virus infection such as hepatitis B, chickenpox, cold sores, or herpes)  immune system problems  irregular heartbeat  kidney disease  low blood counts, like low white cell, platelet, or red cell counts  lung or breathing disease, like asthma  recently received or scheduled to receive a vaccine  an unusual or allergic reaction to rituximab, other medicines, foods, dyes, or preservatives  pregnant or trying to get pregnant  breast-feeding How should I use this medicine? This medicine is for infusion into a vein. It is administered in a hospital or clinic by a specially trained health care professional. A special MedGuide will be given to you by the pharmacist with each prescription and refill. Be sure to read this information carefully each time. Talk to your pediatrician regarding the use of this medicine in children. This medicine is not approved for use in children. Overdosage: If you think you have taken too much of this medicine contact a poison control center or emergency room at once. NOTE: This medicine is only for you. Do not share this medicine with others. What if I miss a dose? It is important not to miss a dose. Call your doctor or health care professional if you are unable to keep an appointment. What  may interact with this medicine?  cisplatin  live virus vaccines This list may not describe all possible interactions. Give your health care provider a list of all the medicines, herbs, non-prescription drugs, or dietary supplements you use. Also tell them if you smoke, drink alcohol, or use illegal drugs. Some items may interact with your medicine. What should I watch for while using this medicine? Your condition will be monitored carefully while you are receiving this medicine. You may need blood work done while you are taking this medicine. This medicine can cause serious allergic reactions. To reduce your risk you may need to take medicine before treatment with this medicine. Take your medicine as directed. In some patients, this medicine may cause a serious brain infection that may cause death. If you have any problems seeing, thinking, speaking, walking, or standing, tell your healthcare professional right away. If you cannot reach your healthcare professional, urgently seek other source of medical care. Call your doctor or health care professional for advice if you get a fever, chills or sore throat, or other symptoms of a cold or flu. Do not treat yourself. This drug decreases your body's ability to fight infections. Try to avoid being around people who are sick. Do not become pregnant while taking this medicine or for at least 12 months after stopping it. Women should inform their doctor if they wish to become pregnant or think they might be pregnant. There is a potential for serious side effects to an unborn child. Talk to your health care professional or pharmacist for more information. Do not breast-feed an infant while taking this medicine or for at   least 6 months after stopping it. What side effects may I notice from receiving this medicine? Side effects that you should report to your doctor or health care professional as soon as possible:  allergic reactions like skin rash, itching or  hives; swelling of the face, lips, or tongue  breathing problems  chest pain  changes in vision  diarrhea  headache with fever, neck stiffness, sensitivity to light, nausea, or confusion  fast, irregular heartbeat  loss of memory  low blood counts - this medicine may decrease the number of white blood cells, red blood cells and platelets. You may be at increased risk for infections and bleeding.  mouth sores  problems with balance, talking, or walking  redness, blistering, peeling or loosening of the skin, including inside the mouth  signs of infection - fever or chills, cough, sore throat, pain or difficulty passing urine  signs and symptoms of kidney injury like trouble passing urine or change in the amount of urine  signs and symptoms of liver injury like dark yellow or brown urine; general ill feeling or flu-like symptoms; light-colored stools; loss of appetite; nausea; right upper belly pain; unusually weak or tired; yellowing of the eyes or skin  signs and symptoms of low blood pressure like dizziness; feeling faint or lightheaded, falls; unusually weak or tired  stomach pain  swelling of the ankles, feet, hands  unusual bleeding or bruising  vomiting Side effects that usually do not require medical attention (report to your doctor or health care professional if they continue or are bothersome):  headache  joint pain  muscle cramps or muscle pain  nausea  tiredness This list may not describe all possible side effects. Call your doctor for medical advice about side effects. You may report side effects to FDA at 1-800-FDA-1088. Where should I keep my medicine? This drug is given in a hospital or clinic and will not be stored at home. NOTE: This sheet is a summary. It may not cover all possible information. If you have questions about this medicine, talk to your doctor, pharmacist, or health care provider.  2020 [employer]vier/Gold Standard (2018-07-29  22:01:36)  Coronavirus (COVID-19) Are you at risk?  Are you at risk for the Coronavirus (COVID-19)?  To be considered HIGH RISK for Coronavirus (COVID-19), you have to meet the following criteria:  . Traveled to [place], [place], [place], [place] or [place]; or in the [place] to [place], [place], [place], or [place]; and have fever, cough, and shortness of breath within the last 2 weeks of travel OR . Been in close contact with a person diagnosed with COVID-19 within the last 2 weeks and have fever, cough, and shortness of breath . IF YOU DO NOT MEET THESE CRITERIA, YOU ARE CONSIDERED LOW RISK FOR COVID-19.  What to do if you are HIGH RISK for COVID-19?  . If you are having a medical emergency, call 911. . Seek medical care right away. Before you go to a doctor's office, urgent care or emergency department, call ahead and tell them about your recent travel, contact with someone diagnosed with COVID-19, and your symptoms. You should receive instructions from your physician's office regarding next steps of care.  . When you arrive at healthcare provider, tell the healthcare staff immediately you have returned from visiting [place], [place], [place], [place] or [place]; or traveled in the [place] to [place], [place], [place], or [place]; in the last two weeks or you have been in   close contact with a person diagnosed with COVID-19 in the last 2 weeks.   . Tell the health care staff about your symptoms: fever, cough and shortness of breath. . After you have been seen by a medical provider, you will be either: o Tested for (COVID-19) and discharged home on quarantine except to seek medical care if symptoms worsen, and asked to  - Stay home and avoid contact with others until you get your results (4-5 days)  - Avoid travel on public transportation if possible (such as bus, train, or airplane) or o Sent to the Emergency Department by EMS for evaluation, COVID-19 testing, and  possible admission depending on your condition and test results.  What to do if you are LOW RISK for COVID-19?  Reduce your risk of any infection by using the same precautions used for avoiding the common cold or flu:  . Wash your hands often with soap and warm water for at least 20 seconds.  If soap and water are not readily available, use an alcohol-based hand sanitizer with at least 60% alcohol.  . If coughing or sneezing, cover your mouth and nose by coughing or sneezing into the elbow areas of your shirt or coat, into a tissue or into your sleeve (not your hands). . Avoid shaking hands with others and consider head nods or verbal greetings only. . Avoid touching your eyes, nose, or mouth with unwashed hands.  . Avoid close contact with people who are sick. . Avoid places or events with large numbers of people in one location, like concerts or sporting events. . Carefully consider travel plans you have or are making. . If you are planning any travel outside or inside the [place], visit the CDC's Travelers' Health webpage for the latest health notices. . If you have some symptoms but not all symptoms, continue to monitor at home and seek medical attention if your symptoms worsen. . If you are having a medical emergency, call 911.   ADDITIONAL HEALTHCARE OPTIONS FOR PATIENTS   Telehealth / e-Visit: [email or web address]         [facility]: [phone]  [facility]: [phone]                   [facility]: [phone]   

## 2019-03-01 ENCOUNTER — Other Ambulatory Visit: Payer: Self-pay | Admitting: *Deleted

## 2019-03-01 DIAGNOSIS — C8108 Nodular lymphocyte predominant Hodgkin lymphoma, lymph nodes of multiple sites: Secondary | ICD-10-CM

## 2019-03-01 NOTE — Progress Notes (Signed)
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 Harbor is a wonderful 23 y.o. female who has been referred to [place] by Dr. Fanny Skates for evaluation and management of her newly diagnosed Lymphoproliferative disorder. The pt reports that she is doing well overall. She is accompanied today by her Mom, via [employer].  The pt notes that she first noticed her enlarged left inguinal lymph node in 2010, and notes that it was "really tiny." She presented to an urgent care initially at the time. She is unsure if the lymph node reduced in size.  The pt notes that she noticed her left inguinal lymph node being larger and more sensitive last summer 2019 after a dog jumped on her. She notes that after this, the lymph node continued growing in size. She notes that the lymph node began causing pain in February 2020. She notes that this lymph node would increase in size in correlation with her periods. The pt notes that she made an appointment to see Ellen Henri, [place] at [facility], who referred her to surgeon Dr. Dalbert Batman.   The pt denies noticing any other new lumps or bumps and denies fevers, chills, night sweats or unexpected weight loss. The pt notes that she has had some new SOB, which she notes improves after eating ice, and has occurred more frequently recently. She endorses ice picca symptoms. She feels that she has to take deep breaths at night when she "settles down."  She does continue to feel a sense of swelling in her left groin.  The pt also endorses some discomfort in her right, lower abdomen. She was seen to have a thickened bladder and right sided ovarian cyst on the 11/17/18 CT A/P, as noted below. She denies a history of kidney stones or UTIs. She also notes that she  has felt some discomfort since her miscarriage in late April.  The pt denies using any steroids recently. The pt reports that she has had stable asthma and uses an inhaler as needed, not used frequently, endorses seasonal correlation. The pt also notes that she has been anemic since 2010, thought to be related to iron deficiency and endorses heavier periods. She notes that her periods can last up to 7-8 days which is her baseline. She has been taking PO Iron replacement for the last two months, denies other previous iron replacement. She has never had a blood transfusion.   The pt notes that she was pregnant once before and had a miscarriage about two months ago. She notes this occurred around 8-[redacted] weeks gestation, not followed by D&C.   The pt denies past surgeries and denies any medication allergies. She is not taking any chronic medications.  Of note prior to the patient's visit today, pt has had a CT A/P completed on 11/17/18 with results revealing "Enlarged presumed lymph node in the left inguinal region measuring 5.2 x 3.9 x 3.8 cm. There is enhancement of this lymph node. Etiology for this localized lymph node prominence is uncertain. Tissue sampling may well be warranted given this finding. No other adenopathy noted in the abdomen or pelvis. 2.  Spleen does not appear appreciably enlarged. 3. Wall thickening in the urinary bladder. Suspect a degree of cystitis. No renal or ureteral calculus. No hydronephrosis. 4. Apparent recent ovarian cyst  rupture on the right with mild fluid tracking from the right adnexa and cul-de-sac. 5. No evident bowel obstruction or bowel wall thickening. No abscess in the abdomen pelvis. No periappendiceal region inflammatory change."  Most recent lab results (623/20) of CBC w/diff and CMP is as follows: all values are WNL except for WBC at 3.5k, HGB at 9.4, HCT at 32.5, MCV at 65.7, MCH at 19.0, MCHC at 28.9, RDW at 21.3, ANC at 1.5k, Potassium at 3.3.  On review of  systems, pt reports some SOB, discomfort in right lower abdomen, left inguinal swelling, and denies fevers, chills, night sweats, unexpected weight loss, noticing any other lumps or bumps, mouth soreness, leg swelling, skin rashes, itching, and any other symptoms.   On PMHx the pt reports asthma and iron deficiency anemia. One pregnancy with miscarriage at 8-9 weeks. On Social Hx the pt denies smoking cigarettes and denies alcohol consumption. She works at [employer] as a Scientist, water quality. On Family Hx the pt denies blood disorders nor cancers.   INTERVAL HISTORY:  [name] is here today for follow up and treatment of her Lymphoproliferative Disorder. The patient's last visit with [place] was on 02/15/2019. The pt reports that she is doing well overall.  The pt reports that she has no new concerns at this time. Pt is not taking any PO iron supplements. Pt had a menstrual period at the beginning of her treatment. Pt has not had her flu shot. Pt reports that she doesn't eat a lot and snacks through out the day. She also becomes satiated quickly. When she eats too much her stomach hurts. Pt does not experience heartburn. Her stomach is especially upset by dairy products but does not respond well to most foods. She has been drinking a protein shake every morning. Pt feels that she is being emotionally supported at home. Pt has lower abdominal pain which she has spoken to her Gynecologist about who believes that it is due to her constipation. Pt denies constipation at this time.   Lab results today, (03/02/19) of CBC w/diff and CMP is as follows: all values are WNL except for RBC at 5.15, MCV at 77.5, MCH at 24.7.   On review of systems, pt reports low appetite, pain in her lower abdomen and denies skin rashes, mouth sores, chills, fevers, night sweats, new lumps/bumps, issues passing urine, constipation and any other symptoms.    MEDICAL HISTORY:  Past Medical History:  Diagnosis Date   Anemia    Asthma      no meds, allergy induced   Inguinal adenopathy 12/22/2018    SURGICAL HISTORY: Past Surgical History:  Procedure Laterality Date   MASS EXCISION Left 12/22/2018   Procedure: EXCISION DEEP LEFT INGUINAL LYMPH NODE;  Surgeon: Fanny Skates, MD;  Location: [facility];  Service: General;  Laterality: Left;    SOCIAL HISTORY: Social History   Socioeconomic History   Marital status: Single    Spouse name: Not on file   Number of children: Not on file   Years of education: Not on file   Highest education level: Not on file  Occupational History   Not on file  Social Needs   Financial resource strain: Not on file   Food insecurity    Worry: Not on file    Inability: Not on file   Transportation needs    Medical: Not on file    Non-medical: Not on file  Tobacco Use   Smoking status: Never Smoker  Smokeless tobacco: Never Used  Substance and Sexual Activity   Alcohol use: No   Drug use: No   Sexual activity: Yes    Birth control/protection: None  Lifestyle   Physical activity    Days per week: Not on file    Minutes per session: Not on file   Stress: Not on file  Relationships   Social connections    Talks on phone: Not on file    Gets together: Not on file    Attends religious service: Not on file    Active member of club or organization: Not on file    Attends meetings of clubs or organizations: Not on file    Relationship status: Not on file   Intimate partner violence    Fear of current or ex partner: Not on file    Emotionally abused: Not on file    Physically abused: Not on file    Forced sexual activity: Not on file  Other Topics Concern   Not on file  Social History Narrative   Not on file    FAMILY HISTORY: No family history on file.  ALLERGIES:  is allergic to fruit & vegetable daily [nutritional supplements] and peanut-containing drug products.  MEDICATIONS:  Current Outpatient Medications  Medication Sig  Dispense Refill   HYDROcodone-acetaminophen (NORCO) 5-325 MG tablet Take 1-2 tablets by mouth every 6 (six) hours as needed for moderate pain or severe pain. (Patient not taking: Reported on 01/13/2019) 20 tablet 0   No current facility-administered medications for this visit.    Facility-Administered Medications Ordered in Other Visits  Medication Dose Route Frequency Provider Last Rate Last Dose   acetaminophen (TYLENOL) tablet 650 mg  650 mg Oral Once Brunetta Genera, MD       diphenhydrAMINE (BENADRYL) injection 50 mg  50 mg Intravenous Once Brunetta Genera, MD       famotidine (PEPCID) 40 mg in sodium chloride 0.9 % 50 mL IVPB  40 mg Intravenous Once Brunetta Genera, MD       methylPREDNISolone sodium succinate (SOLU-MEDROL) 125 mg/2 mL injection 125 mg  125 mg Intravenous Once Brunetta Genera, MD       montelukast (SINGULAIR) tablet 10 mg  10 mg Oral Once Brunetta Genera, MD       riTUXimab (RITUXAN) 500 mg in sodium chloride 0.9 % 250 mL (1.6667 mg/mL) infusion  375 mg/m2 (Treatment Plan Recorded) Intravenous Once Brunetta Genera, MD        REVIEW OF SYSTEMS:    A 10+ POINT REVIEW OF SYSTEMS WAS OBTAINED including neurology, dermatology, psychiatry, cardiac, respiratory, lymph, extremities, GI, GU, Musculoskeletal, constitutional, breasts, reproductive, HEENT.  All pertinent positives are noted in the HPI.  All others are negative.   PHYSICAL EXAMINATION: ECOG PERFORMANCE STATUS: 0 - Asymptomatic  Vitals:   03/02/19 1008  BP: (!) 112/92  Pulse: 72  Resp: 18  Temp: 98.5 F (36.9 C)  SpO2: 100%   Filed Weights   03/02/19 1008  Weight: 97 lb 12.8 oz (44.4 kg)   Body mass index is 17.32 kg/m.  GENERAL:alert, in no acute distress and comfortable SKIN: no acute rashes, no significant lesions EYES: conjunctiva are pink and non-injected, sclera anicteric OROPHARYNX: MMM, no exudates, no oropharyngeal erythema or ulceration NECK: supple, no  JVD LYMPH:  no palpable lymphadenopathy in the cervical, axillary or inguinal regions LUNGS: clear to auscultation b/l with normal respiratory effort HEART: regular rate & rhythm ABDOMEN:  normoactive bowel sounds ,  non tender, not distended. No palpable hepatosplenomegaly.  Extremity: no pedal edema PSYCH: alert & oriented x 3 with fluent speech NEURO: no focal motor/sensory deficits  LABORATORY DATA:  I have reviewed the data as listed  CBC Latest Ref Rng & Units 03/02/2019 02/22/2019 02/15/2019  WBC 4.0 - 10.5 K/uL 4.3 3.9(L) 4.4  Hemoglobin 12.0 - 15.0 g/dL 12.7 13.4 12.3  Hematocrit 36.0 - 46.0 % 39.9 43.3 39.8  Platelets 150 - 400 K/uL 205 227 215   CMP Latest Ref Rng & Units 03/02/2019 02/22/2019 02/15/2019  Glucose 70 - 99 mg/dL 86 81 90  BUN 6 - 20 mg/dL 11 10 14   Creatinine 0.44 - 1.00 mg/dL 0.79 0.79 0.93  Sodium 135 - 145 mmol/L 140 139 138  Potassium 3.5 - 5.1 mmol/L 4.0 3.8 3.7  Chloride 98 - 111 mmol/L 105 106 104  CO2 22 - 32 mmol/L 25 25 25   Calcium 8.9 - 10.3 mg/dL 9.5 9.6 9.5  Total Protein 6.5 - 8.1 g/dL 6.9 7.5 7.0  Total Bilirubin 0.3 - 1.2 mg/dL 0.3 0.4 <0.2(L)  Alkaline Phos 38 - 126 U/L 51 54 52  AST 15 - 41 U/L 17 18 18   ALT 0 - 44 U/L 14 17 19     01/01/2019 Lymph Node Biopsy ON:[id number])   12/22/18 Left Inguinal LN Biopsy:     RADIOGRAPHIC STUDIES: I have personally reviewed the radiological images as listed and agreed with the findings in the report. No results found.  ASSESSMENT & PLAN:  Edena Carandang. Inghram is a 23 y.o. black female with  1. 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 The sample was sent for a second opinion and is awaiting further characterization  for confirmation.  11/17/18 CT A/P which revealed "Enlarged presumed lymph node in the left inguinal region measuring 5.2 x 3.9 x 3.8 cm. There is enhancement of this lymph node. Etiology for this localized lymph node prominence is uncertain. Tissue sampling may well be warranted given this finding. No other adenopathy noted in the abdomen or pelvis. 2.  Spleen does not appear appreciably enlarged. 3. Wall thickening in the urinary bladder. Suspect a degree of cystitis. No renal or ureteral calculus. No hydronephrosis. 4. Apparent recent ovarian cyst rupture on the right with mild fluid tracking from the right adnexa and cul-de-sac. 5. No evident bowel obstruction or bowel wall thickening. No abscess in the abdomen pelvis. No periappendiceal region inflammatory change."  01/11/2019 PET scan revealed "Bilateral axillary and right subpectoral adenopathy noted with Deauville 5 disease on the left and Deauville 4 disease on the right in this region. Diffuse uniform endometrial activity with SUV of 13.2. Although this is very high for physiologic endometrial activity, the diffuse uniform distribution is more characteristic of benign physiologic endometrial activity rather than malignancy. If the patient has abnormal uterine bleeding or if otherwise clinically warranted, pelvic sonography could be utilized for further characterization of the endometrium. Postoperative findings and low-grade metabolic activity in the vicinity of the excision biopsy of the left inguinal lymph node. No other hypermetabolic adenopathy in the abdomen/pelvis identified"  01/01/2019 biopsy sampling ([id number]) from the [place] shows lymphocyte predominant Hodgkin's lymphoma.  2. Iron deficiency anemia - hgb improved to 12.5 from 8.9 with IV iron.   PLAN: -Discussed pt labwork today, 03/02/19; all  values are WNL except for RBC at 5.15, MCV at 77.5, MCH at 24.7.  -The pt has no prohibitive toxicities from continuing her  4th weekly dose of Rituxan at this time. -Today is the last day of planned treatment -Encouraged pt to eat more  -Recommended that the pt continue to eat well, drink at least 48-64 oz of water each day, and walk 20-30 minutes each day.  -Advised proper health precautions during the pandemic, considering the limited immune suppression from her current treatment.  -Pt will receive flu shot today  -Will see back in 9 weeks with labs -Plan to repeat PET scan in 8 weeks following last dose of Rituxan   FOLLOW UP: PET/CT in 8 weeks RTC with Dr Irene Limbo with labs in 2 months  The total time spent in the appt was 25 minutes and more than 50% was on counseling and direct patient cares.  All of the patient's questions were answered with apparent satisfaction. The patient knows to call the clinic with any problems, questions or concerns.   Sullivan Lone MD [place] AAHIVMS [facility] Hematology/Oncology Physician [facility]  (Office):       [phone] (Work cell):  [phone] (Fax):           [phone]  03/02/2019 10:58 AM   I, Yevette Edwards, am acting as a scribe for Dr. Sullivan Lone.   .I have reviewed the above documentation for accuracy and completeness, and I agree with the above. Brunetta Genera MD

## 2019-03-02 ENCOUNTER — Inpatient Hospital Stay: Payer: 59 | Attending: Hematology

## 2019-03-02 ENCOUNTER — Other Ambulatory Visit: Payer: Self-pay

## 2019-03-02 ENCOUNTER — Inpatient Hospital Stay (HOSPITAL_BASED_OUTPATIENT_CLINIC_OR_DEPARTMENT_OTHER): Payer: 59 | Admitting: Hematology

## 2019-03-02 ENCOUNTER — Telehealth: Payer: Self-pay | Admitting: Hematology

## 2019-03-02 ENCOUNTER — Inpatient Hospital Stay: Payer: 59

## 2019-03-02 VITALS — BP 112/92 | HR 72 | Temp 98.5°F | Resp 18 | Ht 63.0 in | Wt 97.8 lb

## 2019-03-02 VITALS — BP 105/72 | HR 81 | Temp 98.3°F | Resp 18

## 2019-03-02 DIAGNOSIS — Z7189 Other specified counseling: Secondary | ICD-10-CM

## 2019-03-02 DIAGNOSIS — D500 Iron deficiency anemia secondary to blood loss (chronic): Secondary | ICD-10-CM | POA: Diagnosis not present

## 2019-03-02 DIAGNOSIS — Z23 Encounter for immunization: Secondary | ICD-10-CM | POA: Diagnosis not present

## 2019-03-02 DIAGNOSIS — C8100 Nodular lymphocyte predominant Hodgkin lymphoma, unspecified site: Secondary | ICD-10-CM | POA: Insufficient documentation

## 2019-03-02 DIAGNOSIS — Z5112 Encounter for antineoplastic immunotherapy: Secondary | ICD-10-CM | POA: Diagnosis not present

## 2019-03-02 DIAGNOSIS — C8108 Nodular lymphocyte predominant Hodgkin lymphoma, lymph nodes of multiple sites: Secondary | ICD-10-CM

## 2019-03-02 LAB — CBC WITH DIFFERENTIAL (CANCER CENTER ONLY)
Abs Immature Granulocytes: 0.01 10*3/uL (ref 0.00–0.07)
Basophils Absolute: 0 10*3/uL (ref 0.0–0.1)
Basophils Relative: 1 %
Eosinophils Absolute: 0.2 10*3/uL (ref 0.0–0.5)
Eosinophils Relative: 5 %
HCT: 39.9 % (ref 36.0–46.0)
Hemoglobin: 12.7 g/dL (ref 12.0–15.0)
Immature Granulocytes: 0 %
Lymphocytes Relative: 42 %
Lymphs Abs: 1.8 10*3/uL (ref 0.7–4.0)
MCH: 24.7 pg — ABNORMAL LOW (ref 26.0–34.0)
MCHC: 31.8 g/dL (ref 30.0–36.0)
MCV: 77.5 fL — ABNORMAL LOW (ref 80.0–100.0)
Monocytes Absolute: 0.4 10*3/uL (ref 0.1–1.0)
Monocytes Relative: 9 %
Neutro Abs: 1.9 10*3/uL (ref 1.7–7.7)
Neutrophils Relative %: 43 %
Platelet Count: 205 10*3/uL (ref 150–400)
RBC: 5.15 MIL/uL — ABNORMAL HIGH (ref 3.87–5.11)
WBC Count: 4.3 10*3/uL (ref 4.0–10.5)
nRBC: 0 % (ref 0.0–0.2)

## 2019-03-02 LAB — CMP (CANCER CENTER ONLY)
ALT: 14 U/L (ref 0–44)
AST: 17 U/L (ref 15–41)
Albumin: 4.3 g/dL (ref 3.5–5.0)
Alkaline Phosphatase: 51 U/L (ref 38–126)
Anion gap: 10 (ref 5–15)
BUN: 11 mg/dL (ref 6–20)
CO2: 25 mmol/L (ref 22–32)
Calcium: 9.5 mg/dL (ref 8.9–10.3)
Chloride: 105 mmol/L (ref 98–111)
Creatinine: 0.79 mg/dL (ref 0.44–1.00)
GFR, Est AFR Am: >60 mL/min (ref >60)
GFR, Estimated: >60 mL/min (ref >60)
Glucose, Bld: 86 mg/dL (ref 70–99)
Potassium: 4 mmol/L (ref 3.5–5.1)
Sodium: 140 mmol/L (ref 135–145)
Total Bilirubin: 0.3 mg/dL (ref 0.3–1.2)
Total Protein: 6.9 g/dL (ref 6.5–8.1)

## 2019-03-02 MED ORDER — SODIUM CHLORIDE 0.9 % IV SOLN
Freq: Once | INTRAVENOUS | Status: AC
  Administered 2019-03-02: 11:00:00 via INTRAVENOUS
  Filled 2019-03-02: qty 250

## 2019-03-02 MED ORDER — ACETAMINOPHEN 325 MG PO TABS
650.0000 mg | ORAL_TABLET | Freq: Once | ORAL | Status: AC
  Administered 2019-03-02: 11:00:00 650 mg via ORAL

## 2019-03-02 MED ORDER — SODIUM CHLORIDE 0.9 % IV SOLN
375.0000 mg/m2 | Freq: Once | INTRAVENOUS | Status: AC
  Administered 2019-03-02: 500 mg via INTRAVENOUS
  Filled 2019-03-02: qty 50

## 2019-03-02 MED ORDER — SODIUM CHLORIDE 0.9 % IV SOLN
40.0000 mg | Freq: Once | INTRAVENOUS | Status: AC
  Administered 2019-03-02: 40 mg via INTRAVENOUS
  Filled 2019-03-02: qty 4

## 2019-03-02 MED ORDER — INFLUENZA VAC SPLIT QUAD 0.5 ML IM SUSY
0.5000 mL | PREFILLED_SYRINGE | Freq: Once | INTRAMUSCULAR | Status: AC
  Administered 2019-03-02: 0.5 mL via INTRAMUSCULAR
  Filled 2019-03-02: qty 0.5

## 2019-03-02 MED ORDER — METHYLPREDNISOLONE SODIUM SUCC 125 MG IJ SOLR
125.0000 mg | Freq: Once | INTRAMUSCULAR | Status: AC
  Administered 2019-03-02: 125 mg via INTRAVENOUS

## 2019-03-02 MED ORDER — METHYLPREDNISOLONE SODIUM SUCC 125 MG IJ SOLR
INTRAMUSCULAR | Status: AC
  Filled 2019-03-02: qty 2

## 2019-03-02 MED ORDER — DIPHENHYDRAMINE HCL 50 MG/ML IJ SOLN
50.0000 mg | Freq: Once | INTRAMUSCULAR | Status: AC
  Administered 2019-03-02: 50 mg via INTRAVENOUS

## 2019-03-02 MED ORDER — ACETAMINOPHEN 325 MG PO TABS
ORAL_TABLET | ORAL | Status: AC
  Filled 2019-03-02: qty 2

## 2019-03-02 MED ORDER — MONTELUKAST SODIUM 10 MG PO TABS
10.0000 mg | ORAL_TABLET | Freq: Once | ORAL | Status: AC
  Administered 2019-03-02: 10 mg via ORAL

## 2019-03-02 MED ORDER — MONTELUKAST SODIUM 10 MG PO TABS
ORAL_TABLET | ORAL | Status: AC
  Filled 2019-03-02: qty 1

## 2019-03-02 MED ORDER — DIPHENHYDRAMINE HCL 50 MG/ML IJ SOLN
INTRAMUSCULAR | Status: AC
  Filled 2019-03-02: qty 1

## 2019-03-02 MED ORDER — FAMOTIDINE IN NACL 20-0.9 MG/50ML-% IV SOLN
INTRAVENOUS | Status: AC
  Filled 2019-03-02: qty 50

## 2019-03-02 NOTE — Telephone Encounter (Signed)
Scheduled appt per 9/1 los.  Spoke with patient and she is aware of the appt date and time.

## 2019-03-02 NOTE — Patient Instructions (Signed)
[facility] Discharge Instructions for Patients Receiving Chemotherapy  Today you received the following chemotherapy agents:  Rituxan.  To help prevent nausea and vomiting after your treatment, we encourage you to take your nausea medication as directed.   If you develop nausea and vomiting that is not controlled by your nausea medication, call the clinic.   BELOW ARE SYMPTOMS THAT SHOULD BE REPORTED IMMEDIATELY:  *FEVER GREATER THAN 100.5 F  *CHILLS WITH OR WITHOUT FEVER  NAUSEA AND VOMITING THAT IS NOT CONTROLLED WITH YOUR NAUSEA MEDICATION  *UNUSUAL SHORTNESS OF BREATH  *UNUSUAL BRUISING OR BLEEDING  TENDERNESS IN MOUTH AND THROAT WITH OR WITHOUT PRESENCE OF ULCERS  *URINARY PROBLEMS  *BOWEL PROBLEMS  UNUSUAL RASH Items with * indicate a potential emergency and should be followed up as soon as possible.  Feel free to call the clinic should you have any questions or concerns. The clinic phone number is [phone].  Please show the CHEMO ALERT CARD at check-in to the Emergency Department and triage nurse.   

## 2019-03-16 ENCOUNTER — Encounter: Payer: Self-pay | Admitting: Hematology

## 2019-03-16 NOTE — Progress Notes (Signed)
Patient called regarding billing financial concerns.   Asked patient if she had applied for Medicaid, she states she has but was denied due to missing information but was never advised what to submit. Advised patient to contact the number on the letter to clarify what is needed and how to further proceed. She verbalized understanding.  Patient had question regarding submitting an expense to be covered from her grant. Advised patient what was needed and she could email it to me. She verbalized understanding.  She has my card for any additional financial questions or concerns.

## 2019-04-26 ENCOUNTER — Other Ambulatory Visit: Payer: Self-pay | Admitting: Family Medicine

## 2019-04-26 DIAGNOSIS — R102 Pelvic and perineal pain: Secondary | ICD-10-CM

## 2019-04-27 ENCOUNTER — Ambulatory Visit (HOSPITAL_COMMUNITY)
Admission: RE | Admit: 2019-04-27 | Discharge: 2019-04-27 | Disposition: A | Discharge status: Home / Self Care | Payer: Medicaid Other | Source: Ambulatory Visit | Attending: Hematology | Admitting: Hematology

## 2019-04-27 ENCOUNTER — Other Ambulatory Visit: Payer: Self-pay

## 2019-04-27 DIAGNOSIS — C8108 Nodular lymphocyte predominant Hodgkin lymphoma, lymph nodes of multiple sites: Secondary | ICD-10-CM | POA: Insufficient documentation

## 2019-04-27 DIAGNOSIS — D500 Iron deficiency anemia secondary to blood loss (chronic): Secondary | ICD-10-CM | POA: Insufficient documentation

## 2019-04-27 LAB — GLUCOSE, CAPILLARY: Glucose-Capillary: 83 mg/dL (ref 70–99)

## 2019-04-27 MED ORDER — FLUDEOXYGLUCOSE F - 18 (FDG) INJECTION
5.0200 | Freq: Once | INTRAVENOUS | Status: AC | PRN
  Administered 2019-04-27: 5.02 via INTRAVENOUS

## 2019-05-03 ENCOUNTER — Inpatient Hospital Stay (HOSPITAL_BASED_OUTPATIENT_CLINIC_OR_DEPARTMENT_OTHER): Payer: Medicaid Other | Admitting: Hematology

## 2019-05-03 ENCOUNTER — Other Ambulatory Visit: Payer: Self-pay

## 2019-05-03 ENCOUNTER — Inpatient Hospital Stay: Payer: Medicaid Other | Attending: Hematology

## 2019-05-03 VITALS — BP 125/78 | HR 93 | Temp 97.6°F | Resp 18 | Ht 63.0 in | Wt 98.3 lb

## 2019-05-03 DIAGNOSIS — D500 Iron deficiency anemia secondary to blood loss (chronic): Secondary | ICD-10-CM

## 2019-05-03 DIAGNOSIS — C8100 Nodular lymphocyte predominant Hodgkin lymphoma, unspecified site: Secondary | ICD-10-CM | POA: Insufficient documentation

## 2019-05-03 DIAGNOSIS — C8108 Nodular lymphocyte predominant Hodgkin lymphoma, lymph nodes of multiple sites: Secondary | ICD-10-CM

## 2019-05-03 DIAGNOSIS — D509 Iron deficiency anemia, unspecified: Secondary | ICD-10-CM | POA: Insufficient documentation

## 2019-05-03 LAB — CMP (CANCER CENTER ONLY)
ALT: 11 U/L (ref 0–44)
AST: 13 U/L — ABNORMAL LOW (ref 15–41)
Albumin: 4.4 g/dL (ref 3.5–5.0)
Alkaline Phosphatase: 53 U/L (ref 38–126)
Anion gap: 11 (ref 5–15)
BUN: 11 mg/dL (ref 6–20)
CO2: 22 mmol/L (ref 22–32)
Calcium: 9.6 mg/dL (ref 8.9–10.3)
Chloride: 107 mmol/L (ref 98–111)
Creatinine: 0.8 mg/dL (ref 0.44–1.00)
GFR, Est AFR Am: >60 mL/min (ref >60)
GFR, Estimated: >60 mL/min (ref >60)
Glucose, Bld: 91 mg/dL (ref 70–99)
Potassium: 3.6 mmol/L (ref 3.5–5.1)
Sodium: 140 mmol/L (ref 135–145)
Total Bilirubin: 0.6 mg/dL (ref 0.3–1.2)
Total Protein: 7.3 g/dL (ref 6.5–8.1)

## 2019-05-03 LAB — CBC WITH DIFFERENTIAL/PLATELET
Abs Immature Granulocytes: 0.02 10*3/uL (ref 0.00–0.07)
Basophils Absolute: 0 10*3/uL (ref 0.0–0.1)
Basophils Relative: 1 %
Eosinophils Absolute: 0.1 10*3/uL (ref 0.0–0.5)
Eosinophils Relative: 2 %
HCT: 40.1 % (ref 36.0–46.0)
Hemoglobin: 13.5 g/dL (ref 12.0–15.0)
Immature Granulocytes: 0 %
Lymphocytes Relative: 26 %
Lymphs Abs: 1.2 10*3/uL (ref 0.7–4.0)
MCH: 28.2 pg (ref 26.0–34.0)
MCHC: 33.7 g/dL (ref 30.0–36.0)
MCV: 83.9 fL (ref 80.0–100.0)
Monocytes Absolute: 0.3 10*3/uL (ref 0.1–1.0)
Monocytes Relative: 8 %
Neutro Abs: 2.8 10*3/uL (ref 1.7–7.7)
Neutrophils Relative %: 63 %
Platelets: 225 10*3/uL (ref 150–400)
RBC: 4.78 MIL/uL (ref 3.87–5.11)
RDW: 14.7 % (ref 11.5–15.5)
WBC: 4.5 10*3/uL (ref 4.0–10.5)
nRBC: 0 % (ref 0.0–0.2)

## 2019-05-03 LAB — IRON AND TIBC
Iron: 53 ug/dL (ref 41–142)
Saturation Ratios: 17 % — ABNORMAL LOW (ref 21–57)
TIBC: 303 ug/dL (ref 236–444)
UIBC: 250 ug/dL (ref 120–384)

## 2019-05-03 LAB — FERRITIN: Ferritin: 12 ng/mL (ref 11–307)

## 2019-05-03 LAB — SEDIMENTATION RATE: Sed Rate: 0 mm/hr (ref 0–22)

## 2019-05-03 LAB — LACTATE DEHYDROGENASE: LDH: 109 U/L (ref 98–192)

## 2019-05-03 NOTE — Progress Notes (Signed)
HEMATOLOGY/ONCOLOGY CONSULTATION NOTE  Date of Service: 05/03/2019  Patient Care Team: [name], PA as PCP - General (Family Medicine)   CHIEF COMPLAINTS/PURPOSE OF CONSULTATION: Lymphocyte predominant hodgkins lymphoma   HISTORY OF PRESENTING ILLNESS:  Leah Gould is a wonderful 23 y.o. female who has been referred to [place] by Dr. Fanny Skates for evaluation and management of her newly diagnosed Lymphoproliferative disorder. The pt reports that she is doing well overall. She is accompanied today by her Mom, via [employer].  The pt notes that she first noticed her enlarged left inguinal lymph node in 2010, and notes that it was "really tiny." She presented to an urgent care initially at the time. She is unsure if the lymph node reduced in size.  The pt notes that she noticed her left inguinal lymph node being larger and more sensitive last summer 2019 after a dog jumped on her. She notes that after this, the lymph node continued growing in size. She notes that the lymph node began causing pain in February 2020. She notes that this lymph node would increase in size in correlation with her periods. The pt notes that she made an appointment to see Ellen Henri, [place] at [facility], who referred her to surgeon Dr. Dalbert Batman.   The pt denies noticing any other new lumps or bumps and denies fevers, chills, night sweats or unexpected weight loss. The pt notes that she has had some new SOB, which she notes improves after eating ice, and has occurred more frequently recently. She endorses ice picca symptoms. She feels that she has to take deep breaths at night when she "settles down."  She does continue to feel a sense of swelling in her left groin.  The pt also endorses some discomfort in her right, lower abdomen. She was seen to have a thickened bladder and right sided ovarian cyst on the 11/17/18 CT A/P, as noted below. She denies a history of kidney stones or UTIs. She also notes that  she has felt some discomfort since her miscarriage in late April.  The pt denies using any steroids recently. The pt reports that she has had stable asthma and uses an inhaler as needed, not used frequently, endorses seasonal correlation. The pt also notes that she has been anemic since 2010, thought to be related to iron deficiency and endorses heavier periods. She notes that her periods can last up to 7-8 days which is her baseline. She has been taking PO Iron replacement for the last two months, denies other previous iron replacement. She has never had a blood transfusion.   The pt notes that she was pregnant once before and had a miscarriage about two months ago. She notes this occurred around 8-[redacted] weeks gestation, not followed by D&C.   The pt denies past surgeries and denies any medication allergies. She is not taking any chronic medications.  Of note prior to the patient's visit today, pt has had a CT A/P completed on 11/17/18 with results revealing "Enlarged presumed lymph node in the left inguinal region measuring 5.2 x 3.9 x 3.8 cm. There is enhancement of this lymph node. Etiology for this localized lymph node prominence is uncertain. Tissue sampling may well be warranted given this finding. No other adenopathy noted in the abdomen or pelvis. 2.  Spleen does not appear appreciably enlarged. 3. Wall thickening in the urinary bladder. Suspect a degree of cystitis. No renal or ureteral calculus. No hydronephrosis. 4. Apparent recent ovarian cyst  rupture on the right with mild fluid tracking from the right adnexa and cul-de-sac. 5. No evident bowel obstruction or bowel wall thickening. No abscess in the abdomen pelvis. No periappendiceal region inflammatory change."  Most recent lab results (623/20) of CBC w/diff and CMP is as follows: all values are WNL except for WBC at 3.5k, HGB at 9.4, HCT at 32.5, MCV at 65.7, MCH at 19.0, MCHC at 28.9, RDW at 21.3, ANC at 1.5k, Potassium at 3.3.  On review of  systems, pt reports some SOB, discomfort in right lower abdomen, left inguinal swelling, and denies fevers, chills, night sweats, unexpected weight loss, noticing any other lumps or bumps, mouth soreness, leg swelling, skin rashes, itching, and any other symptoms.   On PMHx the pt reports asthma and iron deficiency anemia. One pregnancy with miscarriage at 8-9 weeks. On Social Hx the pt denies smoking cigarettes and denies alcohol consumption. She works at [employer] as a Scientist, water quality. On Family Hx the pt denies blood disorders nor cancers.   INTERVAL HISTORY:  Leah Gould is here today for follow up and treatment of her Lymphoproliferative Disorder. We are joined today by her mother. The patient's last visit with [place] was on 03/02/2019. The pt reports that she is doing well overall.  The pt reports no new concerns in the interim and she has been feeling well. Her periods have been regular and not as heavy as before. Pt has had her annual flu shot and is willing to get her pneumonia vaccines.   Of note since the patient's last visit, pt has had PET/CT scan ([id number]) completed on 04/27/2019 with results revealing "1. Significant response to therapy of lymphoma. Residual small bilateral axillary nodes with low-level hypermetabolism. (Deauville) 2. 2. 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 3. Right ovarian hypermetabolism is likely physiologic and may represent underlying corpus luteal cyst."  Lab results today (05/03/19) of CBC w/diff and CMP is as follows: all values are WNL except for AST at 13. 05/03/2019 LDH at 109 05/03/2019 Sed rate at 0 05/03/2019 Ferritin at 12 05/03/2019 Iron and TIBC is as follows: Iron at 53, TIBC at 303, Sat Ratios at 17, UIBC at 250  On review of systems, pt denies fevers, chills, night sweats, fatigue, abdominal pain and any other symptoms.   MEDICAL HISTORY:  Past Medical History:  Diagnosis Date   Anemia     Asthma    no meds, allergy induced   Inguinal adenopathy 12/22/2018    SURGICAL HISTORY: Past Surgical History:  Procedure Laterality Date   MASS EXCISION Left 12/22/2018   Procedure: EXCISION DEEP LEFT INGUINAL LYMPH NODE;  Surgeon: Fanny Skates, MD;  Location: [facility];  Service: General;  Laterality: Left;    SOCIAL HISTORY: Social History   Socioeconomic History   Marital status: Single    Spouse name: Not on file   Number of children: Not on file   Years of education: Not on file   Highest education level: Not on file  Occupational History   Not on file  Social Needs   Financial resource strain: Not on file   Food insecurity    Worry: Not on file    Inability: Not on file   Transportation needs    Medical: Not on file    Non-medical: Not on file  Tobacco Use   Smoking status: Never Smoker   Smokeless tobacco: Never Used  Substance and Sexual Activity   Alcohol use: No  Drug use: No   Sexual activity: Yes    Birth control/protection: None  Lifestyle   Physical activity    Days per week: Not on file    Minutes per session: Not on file   Stress: Not on file  Relationships   Social connections    Talks on phone: Not on file    Gets together: Not on file    Attends religious service: Not on file    Active member of club or organization: Not on file    Attends meetings of clubs or organizations: Not on file    Relationship status: Not on file   Intimate partner violence    Fear of current or ex partner: Not on file    Emotionally abused: Not on file    Physically abused: Not on file    Forced sexual activity: Not on file  Other Topics Concern   Not on file  Social History Narrative   Not on file    FAMILY HISTORY: No family history on file.  ALLERGIES:  is allergic to fruit & vegetable daily [nutritional supplements] and peanut-containing drug products.  MEDICATIONS:  Current Outpatient Medications    Medication Sig Dispense Refill   HYDROcodone-acetaminophen (NORCO) 5-325 MG tablet Take 1-2 tablets by mouth every 6 (six) hours as needed for moderate pain or severe pain. (Patient not taking: Reported on 01/13/2019) 20 tablet 0   No current facility-administered medications for this visit.     REVIEW OF SYSTEMS:   A 10+ POINT REVIEW OF SYSTEMS WAS OBTAINED including neurology, dermatology, psychiatry, cardiac, respiratory, lymph, extremities, GI, GU, Musculoskeletal, constitutional, breasts, reproductive, HEENT.  All pertinent positives are noted in the HPI.  All others are negative.    PHYSICAL EXAMINATION: ECOG PERFORMANCE STATUS: 0 - Asymptomatic  Vitals:   05/03/19 1027  BP: 125/78  Pulse: 93  Resp: 18  Temp: 97.6 F (36.4 C)  SpO2: 100%   Filed Weights   05/03/19 1027  Weight: 98 lb 4.8 oz (44.6 kg)   Body mass index is 17.41 kg/m.  GENERAL:alert, in no acute distress and comfortable SKIN: no acute rashes, no significant lesions EYES: conjunctiva are pink and non-injected, sclera anicteric OROPHARYNX: MMM, no exudates, no oropharyngeal erythema or ulceration NECK: supple, no JVD LYMPH:  no palpable lymphadenopathy in the cervical, axillary or inguinal regions LUNGS: clear to auscultation b/l with normal respiratory effort HEART: regular rate & rhythm ABDOMEN:  normoactive bowel sounds , non tender, not distended. No palpable hepatosplenomegaly.  Extremity: no pedal edema PSYCH: alert & oriented x 3 with fluent speech NEURO: no focal motor/sensory deficits  LABORATORY DATA:  I have reviewed the data as listed  CBC Latest Ref Rng & Units 05/03/2019 03/02/2019 02/22/2019  WBC 4.0 - 10.5 K/uL 4.5 4.3 3.9(L)  Hemoglobin 12.0 - 15.0 g/dL 13.5 12.7 13.4  Hematocrit 36.0 - 46.0 % 40.1 39.9 43.3  Platelets 150 - 400 K/uL 225 205 227   CMP Latest Ref Rng & Units 05/03/2019 03/02/2019 02/22/2019  Glucose 70 - 99 mg/dL 91 86 81  BUN 6 - 20 mg/dL 11 11 10   Creatinine 0.44 -  1.00 mg/dL 0.80 0.79 0.79  Sodium 135 - 145 mmol/L 140 140 139  Potassium 3.5 - 5.1 mmol/L 3.6 4.0 3.8  Chloride 98 - 111 mmol/L 107 105 106  CO2 22 - 32 mmol/L 22 25 25   Calcium 8.9 - 10.3 mg/dL 9.6 9.5 9.6  Total Protein 6.5 - 8.1 g/dL 7.3 6.9 7.5  Total Bilirubin 0.3 - 1.2 mg/dL 0.6 0.3 0.4  Alkaline Phos 38 - 126 U/L 53 51 54  AST 15 - 41 U/L 13(L) 17 18  ALT 0 - 44 U/L 11 14 17     01/01/2019 Lymph Node Biopsy ON:[id number])   12/22/18 Left Inguinal LN Biopsy:     RADIOGRAPHIC STUDIES: I have personally reviewed the radiological images as listed and agreed with the findings in the report. Nm Pet Image Restag (ps) Skull Base To Thigh  Result Date: 04/28/2019 CLINICAL DATA:  Subsequent treatment strategy for Hodgkin's lymphoma. Nodular lymphocytic predominant. EXAM: NUCLEAR MEDICINE PET SKULL BASE TO THIGH TECHNIQUE: 5.0 mCi F-18 FDG was injected intravenously. Full-ring PET imaging was performed from the skull base to thigh after the radiotracer. CT data was obtained and used for attenuation correction and anatomic localization. Fasting blood glucose: 97 mg/dl COMPARISON:  01/11/2019 FINDINGS: Mediastinal blood pool activity: SUV max 1.7 Liver activity: SUV max 2.5 NECK: No areas of abnormal hypermetabolism. Incidental CT findings: No cervical adenopathy. CHEST: Left axillary nodes measure maximally 9 mm and a S.U.V. max of 1.4 today versus 1.4 cm and a S.U.V. max of 7.1 on the prior exam. Right axillary nodes including at up to 6 mm and a S.U.V. max of 0.9 on 52/4. Compare 11 mm and a S.U.V. max of 4.6 on the prior exam. There is new thymic hypermetabolism, likely representing hyperplasia. Example at a S.U.V. max of 3.6. No well-defined thymic or anterior mediastinal mass. Incidental CT findings: No thoracic adenopathy. An index right subpectoral node measures 6 mm on 47/4 versus 10 mm on the prior. ABDOMEN/PELVIS: No abdominopelvic nodal hypermetabolism. No splenic hypermetabolism. The  previously described endometrial hypermetabolism has resolved. There is focal hypermetabolism within the right ovary, including at a S.U.V. max of 13.7 on 158/4. No well-defined mass in this area. Incidental CT findings: No abdominopelvic adenopathy. SKELETON: No abnormal marrow activity. Incidental CT findings: none IMPRESSION: 1. Significant response to therapy of lymphoma. Residual small bilateral axillary nodes with low-level hypermetabolism. (Deauville) 2. 2. 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 3. Right ovarian hypermetabolism is likely physiologic and may represent underlying corpus luteal cyst. Electronically Signed   By: Abigail Miyamoto M.D.   On: 04/28/2019 12:51    ASSESSMENT & PLAN:  Leah Gould is a 23 y.o. black female with  1. 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 The sample was sent for a second opinion and is awaiting further characterization for confirmation.  11/17/18 CT A/P which revealed "Enlarged presumed lymph node in the left inguinal region measuring 5.2 x 3.9 x 3.8 cm. There is enhancement of this lymph node. Etiology for this localized lymph node prominence is uncertain. Tissue sampling may well be warranted given this finding. No other adenopathy noted in the abdomen or pelvis. 2.  Spleen does not appear appreciably enlarged. 3. Wall thickening in the urinary bladder. Suspect a degree of cystitis. No renal or ureteral calculus. No hydronephrosis. 4. Apparent recent ovarian cyst rupture on the right with mild fluid tracking from the right adnexa and cul-de-sac. 5. No evident bowel obstruction or bowel wall thickening. No abscess in the abdomen pelvis. No periappendiceal region inflammatory  change."  01/11/2019 PET scan revealed "Bilateral axillary and right subpectoral adenopathy noted with Deauville  5 disease on the left and Deauville 4 disease on the right in this region. Diffuse uniform endometrial activity with SUV of 13.2. Although this is very high for physiologic endometrial activity, the diffuse uniform distribution is more characteristic of benign physiologic endometrial activity rather than malignancy. If the patient has abnormal uterine bleeding or if otherwise clinically warranted, pelvic sonography could be utilized for further characterization of the endometrium. Postoperative findings and low-grade metabolic activity in the vicinity of the excision biopsy of the left inguinal lymph node. No other hypermetabolic adenopathy in the abdomen/pelvis identified"  01/01/2019 biopsy sampling ([id number]) from the [place] shows lymphocyte predominant Hodgkin's lymphoma.  2. Iron deficiency anemia - hgb improved to 12.5 from 8.9 with IV iron.   PLAN: -Discussed pt labwork today, 05/03/19; blood counts are normal, blood chemistries are normal.  -Discussed 05/03/2019 LDH at 109 -Discussed 05/03/2019 Sed rate at 0 -Discussed 05/03/2019 Ferritin at 12 -Discussed 05/03/2019 Iron and TIBC is as follows: Iron at 53, TIBC at 303, Sat Ratios at 17, UIBC at 250 -Discussed 04/27/2019 PET/CT scan ([id number]) which revealed "1. Significant response to therapy of lymphoma. Residual small bilateral axillary nodes with low-level hypermetabolism. (Deauville) 2. 2. 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 3. Right ovarian hypermetabolism is likely physiologic and may represent underlying corpus luteal cyst." -Recommended Prevnar and Pneumovax beginning 6 months post-treatment  -Will continue to watch with clinical evaluations every 3-4 months and scans every 6-12 months -Recommended sleeping well, de-stressing, eating more fresh  fruits/vegetables and less red meat, continuing to stay active and drinking 48-64 oz of water per day -Recommended po Iron Polysaccharide 150 mg daily, especially in the setting of heavier periods -Plan to give Prevnar with next visit  -Will see back in 3 months with labs   FOLLOW UP: RTC with Dr Irene Limbo with labs in 3 months Prevnar vaccine inj appointment in 3 months with clinic visit  The total time spent in the appt was 20 minutes and more than 50% was on counseling and direct patient cares.  All of the patient's questions were answered with apparent satisfaction. The patient knows to call the clinic with any problems, questions or concerns.   Sullivan Lone MD [place] AAHIVMS [facility] Hematology/Oncology Physician [facility]  (Office):       [phone] (Work cell):  [phone] (Fax):           [phone]  05/03/2019 5:02 PM   I, Yevette Edwards, am acting as a scribe for Dr. Sullivan Lone.   .I have reviewed the above documentation for accuracy and completeness, and I agree with the above. Brunetta Genera MD

## 2019-05-04 ENCOUNTER — Telehealth: Payer: Self-pay | Admitting: Hematology

## 2019-05-04 NOTE — Telephone Encounter (Signed)
Scheduled appt per 11/2 los.. °Spoke with pt and she is aware of her appt date and time. °

## 2019-05-05 ENCOUNTER — Ambulatory Visit
Admission: RE | Admit: 2019-05-05 | Discharge: 2019-05-05 | Disposition: A | Discharge status: Home / Self Care | Payer: 59 | Source: Ambulatory Visit | Attending: Family Medicine | Admitting: Family Medicine

## 2019-05-05 DIAGNOSIS — R102 Pelvic and perineal pain: Secondary | ICD-10-CM

## 2019-06-03 ENCOUNTER — Other Ambulatory Visit: Payer: Self-pay

## 2019-06-03 ENCOUNTER — Encounter (HOSPITAL_COMMUNITY): Payer: Self-pay | Admitting: *Deleted

## 2019-06-03 ENCOUNTER — Inpatient Hospital Stay (HOSPITAL_COMMUNITY)
Admission: AD | Admit: 2019-06-03 | Discharge: 2019-06-03 | Disposition: A | Discharge status: Home / Self Care | Payer: Medicaid Other | Attending: Obstetrics & Gynecology | Admitting: Obstetrics & Gynecology

## 2019-06-03 ENCOUNTER — Inpatient Hospital Stay (HOSPITAL_COMMUNITY): Payer: Medicaid Other

## 2019-06-03 DIAGNOSIS — N760 Acute vaginitis: Secondary | ICD-10-CM

## 2019-06-03 DIAGNOSIS — B9689 Other specified bacterial agents as the cause of diseases classified elsewhere: Secondary | ICD-10-CM | POA: Diagnosis not present

## 2019-06-03 DIAGNOSIS — R109 Unspecified abdominal pain: Secondary | ICD-10-CM | POA: Diagnosis present

## 2019-06-03 DIAGNOSIS — Z3A08 8 weeks gestation of pregnancy: Secondary | ICD-10-CM

## 2019-06-03 DIAGNOSIS — O23591 Infection of other part of genital tract in pregnancy, first trimester: Secondary | ICD-10-CM | POA: Diagnosis not present

## 2019-06-03 DIAGNOSIS — O26899 Other specified pregnancy related conditions, unspecified trimester: Secondary | ICD-10-CM

## 2019-06-03 DIAGNOSIS — K5900 Constipation, unspecified: Secondary | ICD-10-CM | POA: Insufficient documentation

## 2019-06-03 DIAGNOSIS — O26891 Other specified pregnancy related conditions, first trimester: Secondary | ICD-10-CM

## 2019-06-03 LAB — CBC
HCT: 40.8 % (ref 36.0–46.0)
Hemoglobin: 14.1 g/dL (ref 12.0–15.0)
MCH: 29.3 pg (ref 26.0–34.0)
MCHC: 34.6 g/dL (ref 30.0–36.0)
MCV: 84.8 fL (ref 80.0–100.0)
Platelets: 226 10*3/uL (ref 150–400)
RBC: 4.81 MIL/uL (ref 3.87–5.11)
RDW: 13.2 % (ref 11.5–15.5)
WBC: 5.3 10*3/uL (ref 4.0–10.5)
nRBC: 0 % (ref 0.0–0.2)

## 2019-06-03 LAB — URINALYSIS, ROUTINE W REFLEX MICROSCOPIC
Bilirubin Urine: NEGATIVE
Glucose, UA: NEGATIVE mg/dL
Hgb urine dipstick: NEGATIVE
Ketones, ur: NEGATIVE mg/dL
Leukocytes,Ua: NEGATIVE
Nitrite: NEGATIVE
Protein, ur: NEGATIVE mg/dL
Specific Gravity, Urine: 1.012 (ref 1.005–1.030)
pH: 5 (ref 5.0–8.0)

## 2019-06-03 LAB — POCT PREGNANCY, URINE: Preg Test, Ur: POSITIVE — AB

## 2019-06-03 LAB — WET PREP, GENITAL
Sperm: NONE SEEN
Trich, Wet Prep: NONE SEEN
WBC, Wet Prep HPF POC: NONE SEEN
Yeast Wet Prep HPF POC: NONE SEEN

## 2019-06-03 LAB — HCG, QUANTITATIVE, PREGNANCY: hCG, Beta Chain, Quant, S: 159672 m[IU]/mL — ABNORMAL HIGH (ref <5)

## 2019-06-03 MED ORDER — DOCUSATE SODIUM 100 MG PO CAPS: 100.0000 mg | ORAL_CAPSULE | Freq: Two times a day (BID) | ORAL | 0 refills | Status: DC

## 2019-06-03 MED ORDER — METRONIDAZOLE 500 MG PO TABS: 500.0000 mg | ORAL_TABLET | Freq: Two times a day (BID) | ORAL | 0 refills | Status: DC

## 2019-06-03 MED ORDER — ACETAMINOPHEN 325 MG PO TABS
650.0000 mg | ORAL_TABLET | Freq: Once | ORAL | Status: AC
  Administered 2019-06-03: 650 mg via ORAL
  Filled 2019-06-03: qty 2

## 2019-06-03 MED ORDER — POLYETHYLENE GLYCOL 3350 17 G PO PACK
17.0000 g | PACK | Freq: Every day | ORAL | Status: DC | PRN
  Filled 2019-06-03: qty 1

## 2019-06-03 MED ORDER — POLYETHYLENE GLYCOL 3350 17 G PO PACK: 17.0000 g | PACK | Freq: Every day | ORAL | 0 refills | Status: DC | PRN

## 2019-06-03 NOTE — MAU Note (Signed)
Pt brought back to try again

## 2019-06-03 NOTE — Discharge Instructions (Signed)
First Trimester of Pregnancy  The first trimester of pregnancy is from week 1 until the end of week 13 (months 1 through 3). During this time, your baby will begin to develop inside you. At 6-8 weeks, the eyes and face are formed, and the heartbeat can be seen on ultrasound. At the end of 12 weeks, all the baby's organs are formed. Prenatal care is all the medical care you receive before the birth of your baby. Make sure you get good prenatal care and follow all of your doctor's instructions. Follow these instructions at home: Medicines  Take over-the-counter and prescription medicines only as told by your doctor. Some medicines are safe and some medicines are not safe during pregnancy.  Take a prenatal vitamin that contains at least 600 micrograms (mcg) of folic acid.  If you have trouble pooping (constipation), take medicine that will make your stool soft (stool softener) if your doctor approves. Eating and drinking   Eat regular, healthy meals.  Your doctor will tell you the amount of weight gain that is right for you.  Avoid raw meat and uncooked cheese.  If you feel sick to your stomach (nauseous) or throw up (vomit): ? Eat 4 or 5 small meals a day instead of 3 large meals. ? Try eating a few soda crackers. ? Drink liquids between meals instead of during meals.  To prevent constipation: ? Eat foods that are high in fiber, like fresh fruits and vegetables, whole grains, and beans. ? Drink enough fluids to keep your pee (urine) clear or pale yellow. Activity  Exercise only as told by your doctor. Stop exercising if you have cramps or pain in your lower belly (abdomen) or low back.  Do not exercise if it is too hot, too humid, or if you are in a place of great height (high altitude).  Try to avoid standing for long periods of time. Move your legs often if you must stand in one place for a long time.  Avoid heavy lifting.  Wear low-heeled shoes. Sit and stand up  straight.  You can have sex unless your doctor tells you not to. Relieving pain and discomfort  Wear a good support bra if your breasts are sore.  Take warm water baths (sitz baths) to soothe pain or discomfort caused by hemorrhoids. Use hemorrhoid cream if your doctor says it is okay.  Rest with your legs raised if you have leg cramps or low back pain.  If you have puffy, bulging veins (varicose veins) in your legs: ? Wear support hose or compression stockings as told by your doctor. ? Raise (elevate) your feet for 15 minutes, 3-4 times a day. ? Limit salt in your food. Prenatal care  Schedule your prenatal visits by the twelfth week of pregnancy.  Write down your questions. Take them to your prenatal visits.  Keep all your prenatal visits as told by your doctor. This is important. Safety  Wear your seat belt at all times when driving.  Make a list of emergency phone numbers. The list should include numbers for family, friends, the hospital, and police and fire departments. General instructions  Ask your doctor for a referral to a local prenatal class. Begin classes no later than at the start of month 6 of your pregnancy.  Ask for help if you need counseling or if you need help with nutrition. Your doctor can give you advice or tell you where to go for help.  Do not use hot tubs, steam  rooms, or saunas.  Do not douche or use tampons or scented sanitary pads.  Do not cross your legs for long periods of time.  Avoid all herbs and alcohol. Avoid drugs that are not approved by your doctor.  Do not use any tobacco products, including cigarettes, chewing tobacco, and electronic cigarettes. If you need help quitting, ask your doctor. You may get counseling or other support to help you quit.  Avoid cat litter boxes and soil used by cats. These carry germs that can cause birth defects in the baby and can cause a loss of your baby (miscarriage) or stillbirth.  Visit your dentist.  At home, brush your teeth with a soft toothbrush. Be gentle when you floss. Contact a doctor if:  You are dizzy.  You have mild cramps or pressure in your lower belly.  You have a nagging pain in your belly area.  You continue to feel sick to your stomach, you throw up, or you have watery poop (diarrhea).  You have a bad smelling fluid coming from your vagina.  You have pain when you pee (urinate).  You have increased puffiness (swelling) in your face, hands, legs, or ankles. Get help right away if:  You have a fever.  You are leaking fluid from your vagina.  You have spotting or bleeding from your vagina.  You have very bad belly cramping or pain.  You gain or lose weight rapidly.  You throw up blood. It may look like coffee grounds.  You are around people who have [place] measles, fifth disease, or chickenpox.  You have a very bad headache.  You have shortness of breath.  You have any kind of trauma, such as from a fall or a car accident. Summary  The first trimester of pregnancy is from week 1 until the end of week 13 (months 1 through 3).  To take care of yourself and your unborn baby, you will need to eat healthy meals, take medicines only if your doctor tells you to do so, and do activities that are safe for you and your baby.  Keep all follow-up visits as told by your doctor. This is important as your doctor will have to ensure that your baby is healthy and growing well. This information is not intended to replace advice given to you by your health care provider. Make sure you discuss any questions you have with your health care provider. Document Released: 12/04/2007 Document Revised: 10/08/2018 Document Reviewed: 06/25/2016 Elsevier Patient Education  2020 [place] Medications in Pregnancy   Acne: Benzoyl Peroxide Salicylic Acid  Backache/Headache: Tylenol: 2 regular strength every 4 hours OR              2 Extra strength every 6  hours  Colds/Coughs/Allergies: Benadryl (alcohol free) 25 mg every 6 hours as needed Breath right strips Claritin Cepacol throat lozenges Chloraseptic throat spray Cold-Eeze- up to three times per day Cough drops, alcohol free Flonase (by prescription only) Guaifenesin Mucinex Robitussin DM (plain only, alcohol free) Saline nasal spray/drops Sudafed (pseudoephedrine) & Actifed ** use only after [redacted] weeks gestation and if you do not have high blood pressure Tylenol Vicks Vaporub Zinc lozenges Zyrtec   Constipation: Colace Ducolax suppositories Fleet enema Glycerin suppositories Metamucil Milk of magnesia Miralax Senokot Smooth move tea  Diarrhea: Kaopectate Imodium A-D  *NO pepto Bismol  Hemorrhoids: Anusol Anusol HC Preparation H Tucks  Indigestion: Tums Maalox Mylanta Zantac  Pepcid  Insomnia: Benadryl (alcohol free) 25mg  every  6 hours as needed Tylenol PM Unisom, no Gelcaps  Leg Cramps: Tums MagGel  Nausea/Vomiting:  Bonine Dramamine Emetrol Ginger extract Sea bands Meclizine  Nausea medication to take during pregnancy:  Unisom (doxylamine succinate 25 mg tablets) Take one tablet daily at bedtime. If symptoms are not adequately controlled, the dose can be increased to a maximum recommended dose of two tablets daily (1/2 tablet in the morning, 1/2 tablet mid-afternoon and one at bedtime). Vitamin B6 100mg  tablets. Take one tablet twice a day (up to 200 mg per day).  Skin Rashes: Aveeno products Benadryl cream or 25mg  every 6 hours as needed Calamine Lotion 1% cortisone cream  Yeast infection: Gyne-lotrimin 7 Monistat 7   **If taking multiple medications, please check labels to avoid duplicating the same active ingredients **take medication as directed on the label ** Do not exceed 4000 mg of tylenol in 24 hours **Do not take medications that contain aspirin or ibuprofen

## 2019-06-03 NOTE — MAU Provider Note (Signed)
History     CSN: [id number]  Arrival date and time: 06/03/19 [id number]   First Provider Initiated Contact with Patient 06/03/19 1120      Chief Complaint  Patient presents with  . Abdominal Pain   HPI Leah Gould is a 23 y.o. 123456 at [redacted]w[redacted]d by uncertain LMP who presents to MAU with chief complaint of abdominal pain. This is a new problem, onset about 2-3 weeks ago and worsening over time. Patient rates her pain as 7/10, "achy" and intermittently shifting from her LLQ then becoming periumbilical and sometimes located at her right mid-abdomen. She has not taken medication for this complaint because she was not sure which pain medications were safe in pregnancy. Patient also reports irregular spotting, onset precedes onset of abdominal pain and resolved a few days ago.  OB history includes miscarriage at the end of April 2020.  OB History    Gravida  2   Para      Term      Preterm      AB  1   Living        SAB  1   TAB      Ectopic      Multiple      Live Births              Past Medical History:  Diagnosis Date  . Anemia   . Asthma    no meds, allergy induced  . Inguinal adenopathy 12/22/2018    Past Surgical History:  Procedure Laterality Date  . MASS EXCISION Left 12/22/2018   Procedure: EXCISION DEEP LEFT INGUINAL LYMPH NODE;  Surgeon: Fanny Skates, MD;  Location: [facility];  Service: General;  Laterality: Left;    History reviewed. No pertinent family history.  Social History   Tobacco Use  . Smoking status: Never Smoker  . Smokeless tobacco: Never Used  Substance Use Topics  . Alcohol use: No  . Drug use: No    Allergies:  Allergies  Allergen Reactions  . Fruit & Vegetable Daily [Nutritional Supplements] Swelling  . Peanut-Containing Drug Products Swelling    Medications Prior to Admission  Medication Sig Dispense Refill Last Dose  . HYDROcodone-acetaminophen (NORCO) 5-325 MG tablet Take 1-2 tablets by mouth  every 6 (six) hours as needed for moderate pain or severe pain. (Patient not taking: Reported on 01/13/2019) 20 tablet 0     Review of Systems  Constitutional: Negative for chills, fatigue and fever.  Respiratory: Negative for shortness of breath.   Gastrointestinal: Positive for abdominal pain and constipation. Negative for nausea and vomiting.  Genitourinary: Positive for vaginal bleeding. Negative for difficulty urinating, dysuria, vaginal discharge and vaginal pain.  Musculoskeletal: Negative for back pain.  All other systems reviewed and are negative.  Physical Exam   Blood pressure 130/78, pulse (!) 109, temperature 98.7 F (37.1 C), temperature source Oral, resp. rate 16, height 5\' 3"  (1.6 m), weight 44.5 kg, last menstrual period 04/08/2019, SpO2 100 %.  Physical Exam  Nursing note and vitals reviewed. Constitutional: She is oriented to person, place, and time. She appears well-developed and well-nourished.  Cardiovascular: Normal rate.  Respiratory: Effort normal and breath sounds normal.  GI: Soft. Bowel sounds are normal. She exhibits no distension. There is no abdominal tenderness. There is no rebound, no guarding and no CVA tenderness.  Genitourinary:    No vaginal discharge.     Genitourinary Comments: Swabs collected via blind swab. Scant foul-smelling discharge noted on swab,  no bleeding or blood-tinged discharge. No external abnormalities noted.   Neurological: She is alert and oriented to person, place, and time.  Skin: Skin is warm and dry.  Psychiatric: She has a normal mood and affect. Her behavior is normal. Judgment and thought content normal.    MAU Course/MDM  Procedures:transvaginal ultrasound  --Reviewed interventions for constipation that are safe in pregnancy. --Rx prenatal vitamin declined by patient  Patient Vitals for the past 24 hrs:  BP Temp Temp src Pulse Resp SpO2 Height Weight  06/03/19 1240 123/74 - - 92 16 - - -  06/03/19 1003 130/78 98.7  F (37.1 C) Oral (!) 109 16 100 % 5\' 3"  (1.6 m) 44.5 kg   Results for orders placed or performed during the hospital encounter of 06/03/19 (from the past 24 hour(s))  Pregnancy, urine POC     Status: Abnormal   Collection Time: 06/03/19 11:05 AM  Result Value Ref Range   Preg Test, Ur POSITIVE (A) NEGATIVE  Urinalysis, Routine w reflex microscopic     Status: Abnormal   Collection Time: 06/03/19 11:10 AM  Result Value Ref Range   Color, Urine YELLOW YELLOW   APPearance HAZY (A) CLEAR   Specific Gravity, Urine 1.012 1.005 - 1.030   pH 5.0 5.0 - 8.0   Glucose, UA NEGATIVE NEGATIVE mg/dL   Hgb urine dipstick NEGATIVE NEGATIVE   Bilirubin Urine NEGATIVE NEGATIVE   Ketones, ur NEGATIVE NEGATIVE mg/dL   Protein, ur NEGATIVE NEGATIVE mg/dL   Nitrite NEGATIVE NEGATIVE   Leukocytes,Ua NEGATIVE NEGATIVE  CBC     Status: None   Collection Time: 06/03/19 11:30 AM  Result Value Ref Range   WBC 5.3 4.0 - 10.5 K/uL   RBC 4.81 3.87 - 5.11 MIL/uL   Hemoglobin 14.1 12.0 - 15.0 g/dL   HCT 40.8 36.0 - 46.0 %   MCV 84.8 80.0 - 100.0 fL   MCH 29.3 26.0 - 34.0 pg   MCHC 34.6 30.0 - 36.0 g/dL   RDW 13.2 11.5 - 15.5 %   Platelets 226 150 - 400 K/uL   nRBC 0.0 0.0 - 0.2 %  Wet prep, genital     Status: Abnormal   Collection Time: 06/03/19 11:35 AM   Specimen: Vaginal  Result Value Ref Range   Yeast Wet Prep HPF POC NONE SEEN NONE SEEN   Trich, Wet Prep NONE SEEN NONE SEEN   Clue Cells Wet Prep HPF POC PRESENT (A) NONE SEEN   WBC, Wet Prep HPF POC NONE SEEN NONE SEEN   Sperm NONE SEEN    US Ob Comp Less 14 Wks  Result Date: 06/03/2019 CLINICAL DATA:  Abdominal cramping with vaginal spotting EXAM: OBSTETRIC <14 WK ULTRASOUND TECHNIQUE: Transabdominal ultrasound was performed for evaluation of the gestation as well as the maternal uterus and adnexal regions. COMPARISON:  Pelvic ultrasound May 05, 2019 FINDINGS: Intrauterine gestational sac: Visualized Yolk sac:  Visualized Embryo:  Visualized  Cardiac Activity: Visualized Heart Rate: 168 bpm CRL:   20 mm   8 w 4 d                  [place] EDC: January 09, 2020 Subchorionic hemorrhage:  None visualized. Maternal uterus/adnexae: Cervical os is closed. Right ovary measures 2.4 x 1.3 x 1.2 cm. Left ovary measures 2.1 x 1.4 x 1.4 cm. No free pelvic fluid. IMPRESSION: Single live intrauterine gestation with estimated gestational age of 70+ weeks. No appreciable subchorionic hemorrhage. Study otherwise unremarkable. Electronically Signed  By: Lowella Grip III M.D.   On: 06/03/2019 12:00    Meds ordered this encounter  Medications  . acetaminophen (TYLENOL) tablet 650 mg  . metroNIDAZOLE (FLAGYL) 500 MG tablet    Sig: Take 1 tablet (500 mg total) by mouth 2 (two) times daily.    Dispense:  14 tablet    Refill:  0    Order Specific Question:   Supervising Provider    Answer:   DOVE, MYRA C [id number]  . docusate sodium (COLACE) 100 MG capsule    Sig: Take 1 capsule (100 mg total) by mouth every 12 (twelve) hours.    Dispense:  60 capsule    Refill:  0    Order Specific Question:   Supervising Provider    Answer:   DOVE, MYRA C [id number]  . polyethylene glycol (MIRALAX) 17 g packet    Sig: Take 17 g by mouth daily as needed for moderate constipation.    Dispense:  14 each    Refill:  0    Order Specific Question:   Supervising Provider    Answer:   Clovia Cuff C [[id number]]   Assessment and Plan  --23 y.o. G2P0010 at [redacted]w[redacted]d  --Constipation, BV, rx to pharmacy --Discharge home in stable condition  F/U: --Establish prenatal care around 10-13 weeks  Darlina Rumpf, CNM 06/03/2019, 1:01 PM

## 2019-06-03 NOTE — MAU Note (Signed)
Having a lot of dull pain in lower abd. Been happening for a few wks, has gotten worse over the last few days. Has spotting every now and then, none today.   +HPT first wk of Nov, has not been confirmed.

## 2019-06-03 NOTE — MAU Note (Signed)
Unable to urinate, pt to lobby

## 2019-06-04 LAB — GC/CHLAMYDIA PROBE AMP (~~LOC~~) NOT AT ARMC
Chlamydia: NEGATIVE
Comment: NEGATIVE
Comment: NORMAL
Neisseria Gonorrhea: NEGATIVE

## 2019-07-01 LAB — OB RESULTS CONSOLE RPR: RPR: NONREACTIVE

## 2019-07-01 LAB — OB RESULTS CONSOLE RUBELLA ANTIBODY, IGM: Rubella: IMMUNE

## 2019-07-01 LAB — OB RESULTS CONSOLE GC/CHLAMYDIA
Chlamydia: NEGATIVE
Gonorrhea: NEGATIVE

## 2019-07-01 LAB — OB RESULTS CONSOLE ABO/RH: RH Type: POSITIVE

## 2019-07-01 LAB — OB RESULTS CONSOLE ANTIBODY SCREEN: Antibody Screen: NEGATIVE

## 2019-07-01 LAB — OB RESULTS CONSOLE HEPATITIS B SURFACE ANTIGEN: Hepatitis B Surface Ag: NEGATIVE

## 2019-07-01 LAB — OB RESULTS CONSOLE HIV ANTIBODY (ROUTINE TESTING): HIV: NONREACTIVE

## 2019-07-02 NOTE — L&D Delivery Note (Signed)
Delivery Note  Patient Name: Leah Gould DOB: 03-Dec-1995 MRN: [id number]  Date of admission: 01/11/2020 Delivering MD: Gavin Potters K  Date of delivery: 01/11/2020 Type of delivery: SVD  Newborn Data: Live born female  Birth Weight:  Pending APGAR: 68, 9  Newborn Delivery   Birth date/time: 01/11/2020 21:05:00 Delivery type: Vaginal, Spontaneous    [name], 24 y.o., @ [redacted]w[redacted]d,  G2P0010, who was admitted for Active phase labor.. I was called to the room when she progressed +2 station in the second stage of labor.  She pushed for 7 min.  She delivered a viable infant, cephalic and restituted to the ROA position over an intact perineum.  A nuchal cord  was not identified. The baby was placed on maternal abdomen while initial step of NRP were perfmored (Dry, Stimulated, and warmed). Hat placed on baby for thermoregulation. Delayed cord clamping was performed for 1 minutes.  Cord double clamped and cut.  Cord cut by FOB. Apgar scores were 8 and 9. Prophylactic Pitocin was started in the third stage of labor for active management. The placenta delivered spontaneously, shultz, with a 3 vessel cord and was sent to LD.  Inspection revealed 1st degree and labial. An examination of the vaginal vault and cervix was free from lacerations. The uterus was firm, bleeding stable.  The repair was done under epidural anesthesia.  Placenta and umbilical artery blood gas were not sent.  There were no complications during the procedure.  Mom and baby skin to skin following delivery. Left in stable condition.  Maternal Info: Anesthesia:Epidural Episiotomy: N/A Lacerations:  1st degree, bilateral labial Suture Repair: 3-0 Vicryl Est. Blood Loss (mL):  117mL  Newborn Info: Baby Sex: female Circumcision:  Babies Name: Dewain Penning APGAR (1 MIN): 8   APGAR (5 MINS): 9    Mom to postpartum.  Baby to Couplet care / Skin to Skin.  Delivery Report:  Review the Delivery Report for details.      Signed: Zettie Pho, MSN 01/11/2020, 9:39 PM

## 2019-07-29 ENCOUNTER — Telehealth: Payer: Self-pay | Admitting: Hematology

## 2019-07-29 NOTE — Telephone Encounter (Signed)
Returned patient's phone call regarding cancelling 02/02 injection appointment, patient has some concerns and would like that appointment to be cancelled.

## 2019-08-02 NOTE — Progress Notes (Signed)
HEMATOLOGY/ONCOLOGY CONSULTATION NOTE  Date of Service: 08/03/2019  Patient Care Team: [name], PA as PCP - General (Family Medicine)   CHIEF COMPLAINTS/PURPOSE OF CONSULTATION: Lymphocyte predominant hodgkins lymphoma   HISTORY OF PRESENTING ILLNESS:  Leah Gould is a wonderful 24 y.o. female who has been referred to [place] by Dr. Fanny Skates for evaluation and management of her newly diagnosed Lymphoproliferative disorder. The pt reports that she is doing well overall. She is accompanied today by her Mom, via [employer].  The pt notes that she first noticed her enlarged left inguinal lymph node in 2010, and notes that it was "really tiny." She presented to an urgent care initially at the time. She is unsure if the lymph node reduced in size.  The pt notes that she noticed her left inguinal lymph node being larger and more sensitive last summer 2019 after a dog jumped on her. She notes that after this, the lymph node continued growing in size. She notes that the lymph node began causing pain in February 2020. She notes that this lymph node would increase in size in correlation with her periods. The pt notes that she made an appointment to see Ellen Henri, [place] at [facility], who referred her to surgeon Dr. Dalbert Batman.   The pt denies noticing any other new lumps or bumps and denies fevers, chills, night sweats or unexpected weight loss. The pt notes that she has had some new SOB, which she notes improves after eating ice, and has occurred more frequently recently. She endorses ice picca symptoms. She feels that she has to take deep breaths at night when she "settles down."  She does continue to feel a sense of swelling in her left groin.  The pt also endorses some discomfort in her right, lower abdomen. She was seen to have a thickened bladder and right sided ovarian cyst on the 11/17/18 CT A/P, as noted below. She denies a history of kidney stones or UTIs. She also notes that she  has felt some discomfort since her miscarriage in late April.  The pt denies using any steroids recently. The pt reports that she has had stable asthma and uses an inhaler as needed, not used frequently, endorses seasonal correlation. The pt also notes that she has been anemic since 2010, thought to be related to iron deficiency and endorses heavier periods. She notes that her periods can last up to 7-8 days which is her baseline. She has been taking PO Iron replacement for the last two months, denies other previous iron replacement. She has never had a blood transfusion.   The pt notes that she was pregnant once before and had a miscarriage about two months ago. She notes this occurred around 8-[redacted] weeks gestation, not followed by D&C.   The pt denies past surgeries and denies any medication allergies. She is not taking any chronic medications.  Of note prior to the patient's visit today, pt has had a CT A/P completed on 11/17/18 with results revealing "Enlarged presumed lymph node in the left inguinal region measuring 5.2 x 3.9 x 3.8 cm. There is enhancement of this lymph node. Etiology for this localized lymph node prominence is uncertain. Tissue sampling may well be warranted given this finding. No other adenopathy noted in the abdomen or pelvis. 2.  Spleen does not appear appreciably enlarged. 3. Wall thickening in the urinary bladder. Suspect a degree of cystitis. No renal or ureteral calculus. No hydronephrosis. 4. Apparent recent ovarian cyst  rupture on the right with mild fluid tracking from the right adnexa and cul-de-sac. 5. No evident bowel obstruction or bowel wall thickening. No abscess in the abdomen pelvis. No periappendiceal region inflammatory change."  Most recent lab results (623/20) of CBC w/diff and CMP is as follows: all values are WNL except for WBC at 3.5k, HGB at 9.4, HCT at 32.5, MCV at 65.7, MCH at 19.0, MCHC at 28.9, RDW at 21.3, ANC at 1.5k, Potassium at 3.3.  On review of  systems, pt reports some SOB, discomfort in right lower abdomen, left inguinal swelling, and denies fevers, chills, night sweats, unexpected weight loss, noticing any other lumps or bumps, mouth soreness, leg swelling, skin rashes, itching, and any other symptoms.   On PMHx the pt reports asthma and iron deficiency anemia. One pregnancy with miscarriage at 8-9 weeks. On Social Hx the pt denies smoking cigarettes and denies alcohol consumption. She works at [employer] as a Scientist, water quality. On Family Hx the pt denies blood disorders nor cancers.   INTERVAL HISTORY:  Leah Gould is here today for follow up and treatment of her Lymphoproliferative Disorder. The patient's last visit with [place] was on 05/03/2019. The pt reports that she is doing well overall.  The pt reports that the incision from her biopsy itches and burns occasionally. The incision is not red or swollen. The pt is currently 17 weeks and 2 days pregnant. She is seeing Dr. Landry Mellow as her OBGYN and has been referred to a high-risk OBGYN. She has begun to take her prenatal vitamins but she has not continued taking her PO Iron. She stopped taking oral iron when she found out that she was pregnant. Pt still has OTC Polysaccharide at home. She has noticed some returning ice cravings. Pt has been gaining weight appropriately.   Lab results today (08/03/19) of CBC w/diff and CMP is as follows: all values are WNL except for Hgb at 11.4, HCT at 34.1, Albumin at 3.4, Total Bilirubin at <0.2. 08/03/2019 Ferritin at 11 08/03/2019 Iron and TIBC is as follows: Iron at 21, TIBC at 371, Sat Ratios at 6, UIBC at 350  On review of systems, pt reports ice cravings, itching/burning of incision and denies fevers, chills, new lumps/bumps, abdominal pain and any other symptoms.    MEDICAL HISTORY:  Past Medical History:  Diagnosis Date  . Anemia   . Asthma    no meds, allergy induced  . Inguinal adenopathy 12/22/2018    SURGICAL HISTORY: Past Surgical History:   Procedure Laterality Date  . MASS EXCISION Left 12/22/2018   Procedure: EXCISION DEEP LEFT INGUINAL LYMPH NODE;  Surgeon: Fanny Skates, MD;  Location: [place];  Service: General;  Laterality: Left;    SOCIAL HISTORY: Social History   Socioeconomic History  . Marital status: Single    Spouse name: Not on file  . Number of children: Not on file  . Years of education: Not on file  . Highest education level: Not on file  Occupational History  . Not on file  Tobacco Use  . Smoking status: Never Smoker  . Smokeless tobacco: Never Used  Substance and Sexual Activity  . Alcohol use: No  . Drug use: No  . Sexual activity: Yes    Birth control/protection: None  Other Topics Concern  . Not on file  Social History Narrative  . Not on file   Social Determinants of Health   Financial Resource Strain:   . Difficulty of Paying Living Expenses: Not  on file  Food Insecurity:   . Worried About Charity fundraiser in the Last Year: Not on file  . Ran Out of Food in the Last Year: Not on file  Transportation Needs:   . Lack of Transportation (Medical): Not on file  . Lack of Transportation (Non-Medical): Not on file  Physical Activity:   . Days of Exercise per Week: Not on file  . Minutes of Exercise per Session: Not on file  Stress:   . Feeling of Stress : Not on file  Social Connections:   . Frequency of Communication with Friends and Family: Not on file  . Frequency of Social Gatherings with Friends and Family: Not on file  . Attends Religious Services: Not on file  . Active Member of Clubs or Organizations: Not on file  . Attends Archivist Meetings: Not on file  . Marital Status: Not on file  Intimate Partner Violence:   . Fear of Current or Ex-Partner: Not on file  . Emotionally Abused: Not on file  . Physically Abused: Not on file  . Sexually Abused: Not on file    FAMILY HISTORY: No family history on file.  ALLERGIES:  is allergic to  fruit & vegetable daily [nutritional supplements] and peanut-containing drug products.  MEDICATIONS:  Current Outpatient Medications  Medication Sig Dispense Refill  . Prenatal Vit-Fe Fumarate-FA (PRENATAL VITAMINS PO) Take 1 capsule by mouth daily.    Marland Kitchen docusate sodium (COLACE) 100 MG capsule Take 1 capsule (100 mg total) by mouth every 12 (twelve) hours. (Patient not taking: Reported on 08/03/2019) 60 capsule 0  . metroNIDAZOLE (FLAGYL) 500 MG tablet Take 1 tablet (500 mg total) by mouth 2 (two) times daily. (Patient not taking: Reported on 08/03/2019) 14 tablet 0  . polyethylene glycol (MIRALAX) 17 g packet Take 17 g by mouth daily as needed for moderate constipation. (Patient not taking: Reported on 08/03/2019) 14 each 0   No current facility-administered medications for this visit.    REVIEW OF SYSTEMS:   A 10+ POINT REVIEW OF SYSTEMS WAS OBTAINED including neurology, dermatology, psychiatry, cardiac, respiratory, lymph, extremities, GI, GU, Musculoskeletal, constitutional, breasts, reproductive, HEENT.  All pertinent positives are noted in the HPI.  All others are negative.   PHYSICAL EXAMINATION: ECOG PERFORMANCE STATUS: 0 - Asymptomatic  Vitals:   08/03/19 0842  BP: 125/75  Pulse: 85  Resp: 18  Temp: 97.8 F (36.6 C)  SpO2: 100%   Filed Weights   08/03/19 0842  Weight: 104 lb 14.4 oz (47.6 kg)   Body mass index is 18.58 kg/m.   GENERAL:alert, in no acute distress and comfortable SKIN: no acute rashes, no significant lesions EYES: conjunctiva are pink and non-injected, sclera anicteric OROPHARYNX: MMM, no exudates, no oropharyngeal erythema or ulceration NECK: supple, no JVD LYMPH:  no palpable lymphadenopathy in the cervical, axillary or inguinal regions LUNGS: clear to auscultation b/l with normal respiratory effort HEART: regular rate & rhythm ABDOMEN:  normoactive bowel sounds , non tender, not distended. No palpable hepatosplenomegaly.  Extremity: no pedal  edema PSYCH: alert & oriented x 3 with fluent speech NEURO: no focal motor/sensory deficits  LABORATORY DATA:  I have reviewed the data as listed  CBC Latest Ref Rng & Units 08/03/2019 06/03/2019 05/03/2019  WBC 4.0 - 10.5 K/uL 6.3 5.3 4.5  Hemoglobin 12.0 - 15.0 g/dL 11.4(L) 14.1 13.5  Hematocrit 36.0 - 46.0 % 34.1(L) 40.8 40.1  Platelets 150 - 400 K/uL 209 226 225   CMP  Latest Ref Rng & Units 08/03/2019 05/03/2019 03/02/2019  Glucose 70 - 99 mg/dL 82 91 86  BUN 6 - 20 mg/dL 8 11 11   Creatinine 0.44 - 1.00 mg/dL 0.69 0.80 0.79  Sodium 135 - 145 mmol/L 138 140 140  Potassium 3.5 - 5.1 mmol/L 3.8 3.6 4.0  Chloride 98 - 111 mmol/L 107 107 105  CO2 22 - 32 mmol/L 23 22 25   Calcium 8.9 - 10.3 mg/dL 9.2 9.6 9.5  Total Protein 6.5 - 8.1 g/dL 6.6 7.3 6.9  Total Bilirubin 0.3 - 1.2 mg/dL <0.2(L) 0.6 0.3  Alkaline Phos 38 - 126 U/L 66 53 51  AST 15 - 41 U/L 18 13(L) 17  ALT 0 - 44 U/L 18 11 14     01/01/2019 Lymph Node Biopsy ON:[id number])   12/22/18 Left Inguinal LN Biopsy:     RADIOGRAPHIC STUDIES: I have personally reviewed the radiological images as listed and agreed with the findings in the report. No results found.  ASSESSMENT & PLAN:  Josslyn Firpo. Tapia is a 24 y.o. black female with  1. 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 The sample was sent for a second opinion and is awaiting further characterization for confirmation.  11/17/18 CT A/P which revealed "Enlarged presumed lymph node in the left inguinal region measuring 5.2 x 3.9 x 3.8 cm. There is enhancement of this lymph node. Etiology for this localized lymph node prominence is uncertain. Tissue sampling may well be warranted given this finding. No other adenopathy noted in the abdomen or pelvis.  2.  Spleen does not appear appreciably enlarged. 3. Wall thickening in the urinary bladder. Suspect a degree of cystitis. No renal or ureteral calculus. No hydronephrosis. 4. Apparent recent ovarian cyst rupture on the right with mild fluid tracking from the right adnexa and cul-de-sac. 5. No evident bowel obstruction or bowel wall thickening. No abscess in the abdomen pelvis. No periappendiceal region inflammatory change."  01/11/2019 PET scan revealed "Bilateral axillary and right subpectoral adenopathy noted with Deauville 5 disease on the left and Deauville 4 disease on the right in this region. Diffuse uniform endometrial activity with SUV of 13.2. Although this is very high for physiologic endometrial activity, the diffuse uniform distribution is more characteristic of benign physiologic endometrial activity rather than malignancy. If the patient has abnormal uterine bleeding or if otherwise clinically warranted, pelvic sonography could be utilized for further characterization of the endometrium. Postoperative findings and low-grade metabolic activity in the vicinity of the excision biopsy of the left inguinal lymph node. No other hypermetabolic adenopathy in the abdomen/pelvis identified"  01/01/2019 biopsy sampling ([id number]) from the [place] shows lymphocyte predominant Hodgkin's lymphoma.  04/27/2019 PET/CT scan ([id number]) which revealed "1. Significant response to therapy of lymphoma. Residual small bilateral axillary nodes with low-level hypermetabolism. (Deauville) 2. 2. 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 3. Right ovarian hypermetabolism is likely physiologic and may represent underlying corpus luteal cyst."  2. Iron deficiency anemia - hgb improved to 12.5 from 8.9 with IV iron.   PLAN: -Discussed pt labwork today, 08/03/19; all values are WNL except for Hgb at 11.4, HCT at 34.1, Albumin at 3.4, Total Bilirubin at  <0.2. -Discussed 08/03/2019 Ferritin at 11, Goal Ferritin >=30 -Discussed 08/03/2019 Iron and TIBC is as follows: Iron  at 21, TIBC at 371, Sat Ratios at 6, UIBC at 350 -Will hold Pneumonia vaccines until after birth -Advised pt that there are no known negative affects on fetal development from previous Rituxan use -Advised pt that hormonal changes during pregnancy can affect immune response - will keep a close eye on symptoms  -Will continue to monitor anemia and lymphoma with clinic visits every 3 months  -Will avoid rpt CT scan, if possible, during pregnancy -Recommend pt take one PO Iron Polysaccharide 150 mg daily, can increase to BID in 1 week -Advised pt to discuss work safety with high-risk OBGYN regarding high-risk pregnancy and COVID19 exposure -Recommend pt f/u with Dr. Landry Mellow as scheduled  -Will see back in 3 months with labs    FOLLOW UP: RTC with Dr Irene Limbo with labs in 3 months   The total time spent in the appt was 20 minutes and more than 50% was on counseling and direct patient cares.  All of the patient's questions were answered with apparent satisfaction. The patient knows to call the clinic with any problems, questions or concerns.    Sullivan Lone MD [place] AAHIVMS [facility] Hematology/Oncology Physician [facility]  (Office):       [phone] (Work cell):  [phone] (Fax):           [phone]  08/03/2019 9:33 AM   I, Yevette Edwards, am acting as a scribe for Dr. Sullivan Lone.   .I have reviewed the above documentation for accuracy and completeness, and I agree with the above.  Brunetta Genera MD

## 2019-08-03 ENCOUNTER — Encounter (HOSPITAL_COMMUNITY): Payer: Self-pay

## 2019-08-03 ENCOUNTER — Inpatient Hospital Stay: Payer: Medicaid Other | Attending: Hematology

## 2019-08-03 ENCOUNTER — Ambulatory Visit: Payer: Self-pay

## 2019-08-03 ENCOUNTER — Inpatient Hospital Stay (HOSPITAL_BASED_OUTPATIENT_CLINIC_OR_DEPARTMENT_OTHER): Payer: Medicaid Other | Admitting: Hematology

## 2019-08-03 ENCOUNTER — Other Ambulatory Visit: Payer: Self-pay

## 2019-08-03 ENCOUNTER — Ambulatory Visit (HOSPITAL_COMMUNITY): Payer: Medicaid Other | Admitting: *Deleted

## 2019-08-03 ENCOUNTER — Ambulatory Visit (HOSPITAL_COMMUNITY): Payer: Medicaid Other | Attending: Obstetrics and Gynecology | Admitting: Maternal & Fetal Medicine

## 2019-08-03 VITALS — BP 123/70 | HR 92 | Temp 97.7°F

## 2019-08-03 VITALS — BP 125/75 | HR 85 | Temp 97.8°F | Resp 18 | Ht 63.0 in | Wt 104.9 lb

## 2019-08-03 DIAGNOSIS — D509 Iron deficiency anemia, unspecified: Secondary | ICD-10-CM | POA: Insufficient documentation

## 2019-08-03 DIAGNOSIS — C8105 Nodular lymphocyte predominant Hodgkin lymphoma, lymph nodes of inguinal region and lower limb: Secondary | ICD-10-CM | POA: Insufficient documentation

## 2019-08-03 DIAGNOSIS — Z79899 Other long term (current) drug therapy: Secondary | ICD-10-CM | POA: Diagnosis not present

## 2019-08-03 DIAGNOSIS — C8108 Nodular lymphocyte predominant Hodgkin lymphoma, lymph nodes of multiple sites: Secondary | ICD-10-CM

## 2019-08-03 DIAGNOSIS — O99012 Anemia complicating pregnancy, second trimester: Secondary | ICD-10-CM | POA: Diagnosis not present

## 2019-08-03 DIAGNOSIS — Z3A17 17 weeks gestation of pregnancy: Secondary | ICD-10-CM | POA: Diagnosis not present

## 2019-08-03 DIAGNOSIS — D500 Iron deficiency anemia secondary to blood loss (chronic): Secondary | ICD-10-CM

## 2019-08-03 DIAGNOSIS — O0990 Supervision of high risk pregnancy, unspecified, unspecified trimester: Secondary | ICD-10-CM

## 2019-08-03 DIAGNOSIS — D508 Other iron deficiency anemias: Secondary | ICD-10-CM | POA: Insufficient documentation

## 2019-08-03 DIAGNOSIS — O26892 Other specified pregnancy related conditions, second trimester: Secondary | ICD-10-CM | POA: Insufficient documentation

## 2019-08-03 DIAGNOSIS — O9A112 Malignant neoplasm complicating pregnancy, second trimester: Secondary | ICD-10-CM | POA: Insufficient documentation

## 2019-08-03 LAB — CBC WITH DIFFERENTIAL/PLATELET
Abs Immature Granulocytes: 0.04 10*3/uL (ref 0.00–0.07)
Basophils Absolute: 0 10*3/uL (ref 0.0–0.1)
Basophils Relative: 1 %
Eosinophils Absolute: 0.2 10*3/uL (ref 0.0–0.5)
Eosinophils Relative: 3 %
HCT: 34.1 % — ABNORMAL LOW (ref 36.0–46.0)
Hemoglobin: 11.4 g/dL — ABNORMAL LOW (ref 12.0–15.0)
Immature Granulocytes: 1 %
Lymphocytes Relative: 24 %
Lymphs Abs: 1.5 10*3/uL (ref 0.7–4.0)
MCH: 27.9 pg (ref 26.0–34.0)
MCHC: 33.4 g/dL (ref 30.0–36.0)
MCV: 83.4 fL (ref 80.0–100.0)
Monocytes Absolute: 0.6 10*3/uL (ref 0.1–1.0)
Monocytes Relative: 9 %
Neutro Abs: 3.9 10*3/uL (ref 1.7–7.7)
Neutrophils Relative %: 62 %
Platelets: 209 10*3/uL (ref 150–400)
RBC: 4.09 MIL/uL (ref 3.87–5.11)
RDW: 13.5 % (ref 11.5–15.5)
WBC: 6.3 10*3/uL (ref 4.0–10.5)
nRBC: 0 % (ref 0.0–0.2)

## 2019-08-03 LAB — CMP (CANCER CENTER ONLY)
ALT: 18 U/L (ref 0–44)
AST: 18 U/L (ref 15–41)
Albumin: 3.4 g/dL — ABNORMAL LOW (ref 3.5–5.0)
Alkaline Phosphatase: 66 U/L (ref 38–126)
Anion gap: 8 (ref 5–15)
BUN: 8 mg/dL (ref 6–20)
CO2: 23 mmol/L (ref 22–32)
Calcium: 9.2 mg/dL (ref 8.9–10.3)
Chloride: 107 mmol/L (ref 98–111)
Creatinine: 0.69 mg/dL (ref 0.44–1.00)
GFR, Est AFR Am: >60 mL/min (ref >60)
GFR, Estimated: >60 mL/min (ref >60)
Glucose, Bld: 82 mg/dL (ref 70–99)
Potassium: 3.8 mmol/L (ref 3.5–5.1)
Sodium: 138 mmol/L (ref 135–145)
Total Bilirubin: <0.2 mg/dL — ABNORMAL LOW (ref 0.3–1.2)
Total Protein: 6.6 g/dL (ref 6.5–8.1)

## 2019-08-03 LAB — IRON AND TIBC
Iron: 21 ug/dL — ABNORMAL LOW (ref 41–142)
Saturation Ratios: 6 % — ABNORMAL LOW (ref 21–57)
TIBC: 371 ug/dL (ref 236–444)
UIBC: 350 ug/dL (ref 120–384)

## 2019-08-03 LAB — FERRITIN: Ferritin: 11 ng/mL (ref 11–307)

## 2019-08-03 NOTE — Progress Notes (Signed)
MFM Consultation  Referring provider: Christophe Louis, MD Reason for request: Hodgkins Lymphoma in pregnancy Date of service:08/03/19   Ms. Pacer is a 24yo G2P0 at 10 w 2 d dated by an 8 week ultrasound. She is seen in consultation a the request of Dr. Christophe Louis for history of hodgkins lymphoma.  Ms. Tyrrell is doing well today without complaints. She was diagnosed with stage 3A nodular lymphocyte predominant Hodgkins Lymphoma diagnosed by inguinal biopsy. (See Epic Dr. Grier Mitts note).  She has received treatment of 4 weeks of weekly Rituxan. She had since had a great response to therapy characterized by PET/CT demonstrating low grade metabolic activity.  She most recently had an encouraging visit with Dr. Sullivan Lone her noting normal labs but iron deficiency anemia and a plan to receive Pneumovax post delivery  Today Ms. Iiams feels well without complaints.   CBC Latest Ref Rng & Units 08/03/2019 06/03/2019 05/03/2019  WBC 4.0 - 10.5 K/uL 6.3 5.3 4.5  Hemoglobin 12.0 - 15.0 g/dL 11.4(L) 14.1 13.5  Hematocrit 36.0 - 46.0 % 34.1(L) 40.8 40.1  Platelets 150 - 400 K/uL 209 226 225   CMP Latest Ref Rng & Units 08/03/2019 05/03/2019 03/02/2019  Glucose 70 - 99 mg/dL 82 91 86  BUN 6 - 20 mg/dL 8 11 11   Creatinine 0.44 - 1.00 mg/dL 0.69 0.80 0.79  Sodium 135 - 145 mmol/L 138 140 140  Potassium 3.5 - 5.1 mmol/L 3.8 3.6 4.0  Chloride 98 - 111 mmol/L 107 107 105  CO2 22 - 32 mmol/L 23 22 25   Calcium 8.9 - 10.3 mg/dL 9.2 9.6 9.5  Total Protein 6.5 - 8.1 g/dL 6.6 7.3 6.9  Total Bilirubin 0.3 - 1.2 mg/dL <0.2(L) 0.6 0.3  Alkaline Phos 38 - 126 U/L 66 53 51  AST 15 - 41 U/L 18 13(L) 17  ALT 0 - 44 U/L 18 11 14    OB History  Gravida Para Term Preterm AB Living  2       1    SAB TAB Ectopic Multiple Live Births  1            # Outcome Date GA Lbr Len/2nd Weight Sex Delivery Anes PTL Lv  2 Current           1 SAB 11/2018           Past Surgical History:  Procedure Laterality Date  . MASS  EXCISION Left 12/22/2018   Procedure: EXCISION DEEP LEFT INGUINAL LYMPH NODE;  Surgeon: Fanny Skates, MD;  Location: [place];  Service: General;  Laterality: Left;   Social History   Socioeconomic History  . Marital status: Single    Spouse name: Not on file  . Number of children: Not on file  . Years of education: Not on file  . Highest education level: Not on file  Occupational History  . Not on file  Tobacco Use  . Smoking status: Never Smoker  . Smokeless tobacco: Never Used  Substance and Sexual Activity  . Alcohol use: No  . Drug use: No  . Sexual activity: Yes    Birth control/protection: None  Other Topics Concern  . Not on file  Social History Narrative  . Not on file   Social Determinants of Health   Financial Resource Strain:   . Difficulty of Paying Living Expenses: Not on file  Food Insecurity:   . Worried About Charity fundraiser in the Last Year: Not on file  .  Ran Out of Food in the Last Year: Not on file  Transportation Needs:   . Lack of Transportation (Medical): Not on file  . Lack of Transportation (Non-Medical): Not on file  Physical Activity:   . Days of Exercise per Week: Not on file  . Minutes of Exercise per Session: Not on file  Stress:   . Feeling of Stress : Not on file  Social Connections:   . Frequency of Communication with Friends and Family: Not on file  . Frequency of Social Gatherings with Friends and Family: Not on file  . Attends Religious Services: Not on file  . Active Member of Clubs or Organizations: Not on file  . Attends Archivist Meetings: Not on file  . Marital Status: Not on file  Intimate Partner Violence:   . Fear of Current or Ex-Partner: Not on file  . Emotionally Abused: Not on file  . Physically Abused: Not on file  . Sexually Abused: Not on file   Current Outpatient Medications on File Prior to Visit  Medication Sig Dispense Refill  . docusate sodium (COLACE) 100 MG capsule Take  1 capsule (100 mg total) by mouth every 12 (twelve) hours. (Patient not taking: Reported on 08/03/2019) 60 capsule 0  . metroNIDAZOLE (FLAGYL) 500 MG tablet Take 1 tablet (500 mg total) by mouth 2 (two) times daily. (Patient not taking: Reported on 08/03/2019) 14 tablet 0  . polyethylene glycol (MIRALAX) 17 g packet Take 17 g by mouth daily as needed for moderate constipation. (Patient not taking: Reported on 08/03/2019) 14 each 0  . Prenatal Vit-Fe Fumarate-FA (PRENATAL VITAMINS PO) Take 1 capsule by mouth daily.     No current facility-administered medications on file prior to visit.    Her family history is significant of asthma and diabetes but no history of perinatal complication or fetal malformations.  LABS: her prenatal labs are normal.  She had AFP tetra drawn- results pending.  Vitals today: BP 123/ 70 92  Impression: SIUP with recent treatment of Hodgkins Lymphoma that is stable.  Counseling: We discussed that pregnancy outcomes after treatment of Hodgkin Lymphoma is favorable. We reviewed that most concern regarding post treatment complications are related to multiple combinations of chemotherapy and pelvic irradiation which is not the case for Ms. Borromeo.  Secondly we reviewed the use of Rituxan in  pregnancy via the [employer] summary data base shows that Rituximab did not interfere with embryo development in animals and the human registry does not suggest an increased in adverse outcomes.   Thirdly we discussed that pregnancy is not suggested to increase the risk of recurrence either by the influence of hormones or suppressed immune system. It is generally recommended to delay pregnancy for 2 years as 80% of recurrences present within this time however, I explained to Ms. Marier that although she is within this window I would recommend continuing the pregnancy and to expect a great outcome.  Fourthly, if a new findings suggestive of recurrence requiring evaluation we would not  delay imaging at this point and that if therapy was recommended in most cases would favor early treatment if delay would alter Ms. Gentle's outcomes.    I spent 40 minutes with >50% in face to face consultation and care coordination.  All questions answered.  Vikki Ports, MD.

## 2019-08-03 NOTE — Progress Notes (Signed)
Pt to meet with Dr. Gertie Exon for MFM consult.

## 2019-08-06 ENCOUNTER — Telehealth: Payer: Self-pay | Admitting: Hematology

## 2019-08-06 NOTE — Telephone Encounter (Signed)
Scheduled per 02/02 los, patient has been called and notified.

## 2019-09-20 ENCOUNTER — Telehealth: Payer: Self-pay | Admitting: Hematology

## 2019-09-20 NOTE — Telephone Encounter (Signed)
R/s per MD. Hulen Skains and spoke with pt, confirmed 3/30 appt

## 2019-09-28 ENCOUNTER — Inpatient Hospital Stay (HOSPITAL_BASED_OUTPATIENT_CLINIC_OR_DEPARTMENT_OTHER): Payer: Medicaid Other | Admitting: Hematology

## 2019-09-28 ENCOUNTER — Inpatient Hospital Stay: Payer: Medicaid Other | Attending: Hematology

## 2019-09-28 ENCOUNTER — Other Ambulatory Visit: Payer: Self-pay

## 2019-09-28 VITALS — BP 116/78 | HR 84 | Temp 98.5°F | Resp 18 | Ht 63.0 in | Wt 117.4 lb

## 2019-09-28 DIAGNOSIS — Z79899 Other long term (current) drug therapy: Secondary | ICD-10-CM | POA: Diagnosis not present

## 2019-09-28 DIAGNOSIS — D509 Iron deficiency anemia, unspecified: Secondary | ICD-10-CM | POA: Diagnosis not present

## 2019-09-28 DIAGNOSIS — D500 Iron deficiency anemia secondary to blood loss (chronic): Secondary | ICD-10-CM

## 2019-09-28 DIAGNOSIS — C8105 Nodular lymphocyte predominant Hodgkin lymphoma, lymph nodes of inguinal region and lower limb: Secondary | ICD-10-CM | POA: Diagnosis not present

## 2019-09-28 DIAGNOSIS — O2693 Pregnancy related conditions, unspecified, third trimester: Secondary | ICD-10-CM | POA: Diagnosis not present

## 2019-09-28 DIAGNOSIS — O99891 Other specified diseases and conditions complicating pregnancy: Secondary | ICD-10-CM | POA: Insufficient documentation

## 2019-09-28 DIAGNOSIS — R61 Generalized hyperhidrosis: Secondary | ICD-10-CM | POA: Diagnosis not present

## 2019-09-28 DIAGNOSIS — C8108 Nodular lymphocyte predominant Hodgkin lymphoma, lymph nodes of multiple sites: Secondary | ICD-10-CM | POA: Diagnosis not present

## 2019-09-28 DIAGNOSIS — O26893 Other specified pregnancy related conditions, third trimester: Secondary | ICD-10-CM | POA: Insufficient documentation

## 2019-09-28 LAB — CMP (CANCER CENTER ONLY)
ALT: 18 U/L (ref 0–44)
AST: 23 U/L (ref 15–41)
Albumin: 3.2 g/dL — ABNORMAL LOW (ref 3.5–5.0)
Alkaline Phosphatase: 83 U/L (ref 38–126)
Anion gap: 4 — ABNORMAL LOW (ref 5–15)
BUN: 7 mg/dL (ref 6–20)
CO2: 23 mmol/L (ref 22–32)
Calcium: 9 mg/dL (ref 8.9–10.3)
Chloride: 106 mmol/L (ref 98–111)
Creatinine: 0.67 mg/dL (ref 0.44–1.00)
GFR, Est AFR Am: >60 mL/min (ref >60)
GFR, Estimated: >60 mL/min (ref >60)
Glucose, Bld: 81 mg/dL (ref 70–99)
Potassium: 3.8 mmol/L (ref 3.5–5.1)
Sodium: 133 mmol/L — ABNORMAL LOW (ref 135–145)
Total Bilirubin: 0.3 mg/dL (ref 0.3–1.2)
Total Protein: 6.8 g/dL (ref 6.5–8.1)

## 2019-09-28 LAB — CBC WITH DIFFERENTIAL/PLATELET
Abs Immature Granulocytes: 0.1 10*3/uL — ABNORMAL HIGH (ref 0.00–0.07)
Basophils Absolute: 0 10*3/uL (ref 0.0–0.1)
Basophils Relative: 0 %
Eosinophils Absolute: 0.2 10*3/uL (ref 0.0–0.5)
Eosinophils Relative: 3 %
HCT: 36.3 % (ref 36.0–46.0)
Hemoglobin: 11.4 g/dL — ABNORMAL LOW (ref 12.0–15.0)
Immature Granulocytes: 1 %
Lymphocytes Relative: 17 %
Lymphs Abs: 1.4 10*3/uL (ref 0.7–4.0)
MCH: 25.7 pg — ABNORMAL LOW (ref 26.0–34.0)
MCHC: 31.4 g/dL (ref 30.0–36.0)
MCV: 81.9 fL (ref 80.0–100.0)
Monocytes Absolute: 0.6 10*3/uL (ref 0.1–1.0)
Monocytes Relative: 7 %
Neutro Abs: 6 10*3/uL (ref 1.7–7.7)
Neutrophils Relative %: 72 %
Platelets: 228 10*3/uL (ref 150–400)
RBC: 4.43 MIL/uL (ref 3.87–5.11)
RDW: 15.9 % — ABNORMAL HIGH (ref 11.5–15.5)
WBC: 8.4 10*3/uL (ref 4.0–10.5)
nRBC: 0 % (ref 0.0–0.2)

## 2019-09-28 LAB — FERRITIN: Ferritin: 13 ng/mL (ref 11–307)

## 2019-09-28 LAB — LACTATE DEHYDROGENASE: LDH: 185 U/L (ref 98–192)

## 2019-09-28 LAB — SEDIMENTATION RATE: Sed Rate: 7 mm/hr (ref 0–22)

## 2019-09-28 MED ORDER — POLYSACCHARIDE IRON COMPLEX 100 MG/5ML PO LIQD: 100.0000 mg | Freq: Two times a day (BID) | ORAL | 1 refills | Status: DC

## 2019-09-28 NOTE — Progress Notes (Signed)
HEMATOLOGY/ONCOLOGY CONSULTATION NOTE  Date of Service: 09/28/2019  Patient Care Team: [name], PA as PCP - General (Family Medicine)   CHIEF COMPLAINTS/PURPOSE OF CONSULTATION: Lymphocyte predominant hodgkins lymphoma   HISTORY OF PRESENTING ILLNESS:  Leah Gould. Leah Gould is a wonderful 24 y.o. female who has been referred to [place] by Dr. Fanny Skates for evaluation and management of her newly diagnosed Lymphoproliferative disorder. The pt reports that she is doing well overall. She is accompanied today by her Mom, via [employer].  The pt notes that she first noticed her enlarged left inguinal lymph node in 2010, and notes that it was "really tiny." She presented to an urgent care initially at the time. She is unsure if the lymph node reduced in size.  The pt notes that she noticed her left inguinal lymph node being larger and more sensitive last summer 2019 after a dog jumped on her. She notes that after this, the lymph node continued growing in size. She notes that the lymph node began causing pain in February 2020. She notes that this lymph node would increase in size in correlation with her periods. The pt notes that she made an appointment to see Leah Gould, [place] [facility], who referred her to surgeon Dr. Dalbert Batman.   The pt denies noticing any other new lumps or bumps and denies fevers, chills, night sweats or unexpected weight loss. The pt notes that she has had some new SOB, which she notes improves after eating ice, and has occurred more frequently recently. She endorses ice picca symptoms. She feels that she has to take deep breaths at night when she "settles down."  She does continue to feel a sense of swelling in her left groin.  The pt also endorses some discomfort in her right, lower abdomen. She was seen to have a thickened bladder and right sided ovarian cyst on the 11/17/18 CT A/P, as noted below. She denies a history of kidney stones or UTIs. She also notes that  she has felt some discomfort since her miscarriage in late April.  The pt denies using any steroids recently. The pt reports that she has had stable asthma and uses an inhaler as needed, not used frequently, endorses seasonal correlation. The pt also notes that she has been anemic since 2010, thought to be related to iron deficiency and endorses heavier periods. She notes that her periods can last up to 7-8 days which is her baseline. She has been taking PO Iron replacement for the last two months, denies other previous iron replacement. She has never had a blood transfusion.   The pt notes that she was pregnant once before and had a miscarriage about two months ago. She notes this occurred around 8-[redacted] weeks gestation, not followed by D&C.   The pt denies past surgeries and denies any medication allergies. She is not taking any chronic medications.  Of note prior to the patient's visit today, pt has had a CT A/P completed on 11/17/18 with results revealing "Enlarged presumed lymph node in the left inguinal region measuring 5.2 x 3.9 x 3.8 cm. There is enhancement of this lymph node. Etiology for this localized lymph node prominence is uncertain. Tissue sampling may well be warranted given this finding. No other adenopathy noted in the abdomen or pelvis. 2.  Spleen does not appear appreciably enlarged. 3. Wall thickening in the urinary bladder. Suspect a degree of cystitis. No renal or ureteral calculus. No hydronephrosis. 4. Apparent recent ovarian cyst  rupture on the right with mild fluid tracking from the right adnexa and cul-de-sac. 5. No evident bowel obstruction or bowel wall thickening. No abscess in the abdomen pelvis. No periappendiceal region inflammatory change."  Most recent lab results (623/20) of CBC w/diff and CMP is as follows: all values are WNL except for WBC at 3.5k, HGB at 9.4, HCT at 32.5, MCV at 65.7, MCH at 19.0, MCHC at 28.9, RDW at 21.3, ANC at 1.5k, Potassium at 3.3.  On review of  systems, pt reports some SOB, discomfort in right lower abdomen, left inguinal swelling, and denies fevers, chills, night sweats, unexpected weight loss, noticing any other lumps or bumps, mouth soreness, leg swelling, skin rashes, itching, and any other symptoms.   On PMHx the pt reports asthma and iron deficiency anemia. One pregnancy with miscarriage at 8-9 weeks. On Social Hx the pt denies smoking cigarettes and denies alcohol consumption. She works at [employer] as a Scientist, water quality. On Family Hx the pt denies blood disorders nor cancers.   INTERVAL HISTORY:  Leah Gould. Leah Gould is here today for follow up and treatment of her Lymphoproliferative Disorder. The patient's last visit with [place] was on 08/03/2019. The pt reports that she is doing well overall.  The pt reports that she is currently [redacted] weeks pregnant. Pt was taking PO iron polysaccharide once per day, but it was making her vomit and she is having a difficult time keeping it down. She was taking them with a meal. Pt has started having ice cravings again. Pt has not been significantly nauseous with other foods.   Pt has been experiencing night sweats for the last few weeks. She wakes up cold and drenched in sweat. Pt has been having some skin rashes, but notes that her Eczema typically flares in the summertime. Pt has been eating well and gaining weight appropriately.    Lab results today (09/28/19) of CBC w/diff and CMP is as follows: all values are WNL except for Hgb at 11.4, MCH at 25.7, RDW at 15.9, Abs Immature Granulocytes at 0.10K, Sodium at 133, Albumin at 3.2, Anion gap at 4. 09/28/2019 LDH at 185 09/28/2019 Ferritin at 13 09/28/2019 Sed Rate is 7   On review of systems, pt reports night sweats, skin rashes and denies fevers, chills, new lumps/bumps, skin itching, leg swelling, abdominal pain and any other symptoms.   MEDICAL HISTORY:  Past Medical History:  Diagnosis Date  . Anemia   . Asthma    no meds, allergy induced  .  Hodgkin's lymphoma ([employer])   . Inguinal adenopathy 12/22/2018    SURGICAL HISTORY: Past Surgical History:  Procedure Laterality Date  . MASS EXCISION Left 12/22/2018   Procedure: EXCISION DEEP LEFT INGUINAL LYMPH NODE;  Surgeon: Fanny Skates, MD;  Location: [facility];  Service: General;  Laterality: Left;    SOCIAL HISTORY: Social History   Socioeconomic History  . Marital status: Single    Spouse name: Not on file  . Number of children: Not on file  . Years of education: Not on file  . Highest education level: Not on file  Occupational History  . Not on file  Tobacco Use  . Smoking status: Never Smoker  . Smokeless tobacco: Never Used  Substance and Sexual Activity  . Alcohol use: No  . Drug use: No  . Sexual activity: Yes    Birth control/protection: None  Other Topics Concern  . Not on file  Social History Narrative  . Not on file  Social Determinants of Health   Financial Resource Strain:   . Difficulty of Paying Living Expenses:   Food Insecurity:   . Worried About Charity fundraiser in the Last Year:   . Arboriculturist in the Last Year:   Transportation Needs:   . Film/video editor (Medical):   Marland Kitchen Lack of Transportation (Non-Medical):   Physical Activity:   . Days of Exercise per Week:   . Minutes of Exercise per Session:   Stress:   . Feeling of Stress :   Social Connections:   . Frequency of Communication with Friends and Family:   . Frequency of Social Gatherings with Friends and Family:   . Attends Religious Services:   . Active Member of Clubs or Organizations:   . Attends Archivist Meetings:   Marland Kitchen Marital Status:   Intimate Partner Violence:   . Fear of Current or Ex-Partner:   . Emotionally Abused:   Marland Kitchen Physically Abused:   . Sexually Abused:     FAMILY HISTORY: No family history on file.  ALLERGIES:  is allergic to fruit & vegetable daily [nutritional supplements] and peanut-containing drug  products.  MEDICATIONS:  Current Outpatient Medications  Medication Sig Dispense Refill  . Prenatal Vit-Fe Fumarate-FA (PRENATAL VITAMINS PO) Take 1 capsule by mouth daily.    Marland Kitchen docusate sodium (COLACE) 100 MG capsule Take 1 capsule (100 mg total) by mouth every 12 (twelve) hours. (Patient not taking: Reported on 08/03/2019) 60 capsule 0  . metroNIDAZOLE (FLAGYL) 500 MG tablet Take 1 tablet (500 mg total) by mouth 2 (two) times daily. (Patient not taking: Reported on 08/03/2019) 14 tablet 0  . polyethylene glycol (MIRALAX) 17 g packet Take 17 g by mouth daily as needed for moderate constipation. (Patient not taking: Reported on 08/03/2019) 14 each 0  . Polysaccharide Iron Complex 100 MG/5ML LIQD Take 100 mg by mouth 2 (two) times daily with a meal. 300 mL 1   No current facility-administered medications for this visit.    REVIEW OF SYSTEMS:   A 10+ POINT REVIEW OF SYSTEMS WAS OBTAINED including neurology, dermatology, psychiatry, cardiac, respiratory, lymph, extremities, GI, GU, Musculoskeletal, constitutional, breasts, reproductive, HEENT.  All pertinent positives are noted in the HPI.  All others are negative.   PHYSICAL EXAMINATION: ECOG PERFORMANCE STATUS: 0 - Asymptomatic  Vitals:   09/28/19 1510  BP: 116/78  Pulse: 84  Resp: 18  Temp: 98.5 F (36.9 C)  SpO2: 100%   Filed Weights   09/28/19 1510  Weight: 117 lb 6.4 oz (53.3 kg)   Body mass index is 20.8 kg/m.   GENERAL:alert, in no acute distress and comfortable SKIN: no acute rashes, no significant lesions EYES: conjunctiva are pink and non-injected, sclera anicteric OROPHARYNX: MMM, no exudates, no oropharyngeal erythema or ulceration NECK: supple, no JVD LYMPH:  no palpable lymphadenopathy in the cervical, axillary or inguinal regions LUNGS: clear to auscultation b/l with normal respiratory effort HEART: regular rate & rhythm ABDOMEN:  normoactive bowel sounds , non tender, not distended. No palpable hepatosplenomegaly.   Extremity: no pedal edema PSYCH: alert & oriented x 3 with fluent speech NEURO: no focal motor/sensory deficits  LABORATORY DATA:  I have reviewed the data as listed  CBC Latest Ref Rng & Units 09/28/2019 08/03/2019 06/03/2019  WBC 4.0 - 10.5 K/uL 8.4 6.3 5.3  Hemoglobin 12.0 - 15.0 g/dL 11.4(L) 11.4(L) 14.1  Hematocrit 36.0 - 46.0 % 36.3 34.1(L) 40.8  Platelets 150 - 400 K/uL 228  209 226   CMP Latest Ref Rng & Units 09/28/2019 08/03/2019 05/03/2019  Glucose 70 - 99 mg/dL 81 82 91  BUN 6 - 20 mg/dL 7 8 11   Creatinine 0.44 - 1.00 mg/dL 0.67 0.69 0.80  Sodium 135 - 145 mmol/L 133(L) 138 140  Potassium 3.5 - 5.1 mmol/L 3.8 3.8 3.6  Chloride 98 - 111 mmol/L 106 107 107  CO2 22 - 32 mmol/L 23 23 22   Calcium 8.9 - 10.3 mg/dL 9.0 9.2 9.6  Total Protein 6.5 - 8.1 g/dL 6.8 6.6 7.3  Total Bilirubin 0.3 - 1.2 mg/dL 0.3 <0.2(L) 0.6  Alkaline Phos 38 - 126 U/L 83 66 53  AST 15 - 41 U/L 23 18 13(L)  ALT 0 - 44 U/L 18 18 11     01/01/2019 Lymph Node Biopsy [id number])   12/22/18 Left Inguinal LN Biopsy:     RADIOGRAPHIC STUDIES: I have personally reviewed the radiological images as listed and agreed with the findings in the report. No results found.  ASSESSMENT & PLAN:  Cortnie Distler. Marchel is a 24 y.o. black female with  1. 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 The sample was sent for a second opinion and is awaiting further characterization for confirmation.  11/17/18 CT A/P which revealed "Enlarged presumed lymph node in the left inguinal region measuring 5.2 x 3.9 x 3.8 cm. There is enhancement of this lymph node. Etiology for this localized lymph node prominence is uncertain. Tissue sampling may well be warranted given this finding. No other adenopathy noted  in the abdomen or pelvis. 2.  Spleen does not appear appreciably enlarged. 3. Wall thickening in the urinary bladder. Suspect a degree of cystitis. No renal or ureteral calculus. No hydronephrosis. 4. Apparent recent ovarian cyst rupture on the right with mild fluid tracking from the right adnexa and cul-de-sac. 5. No evident bowel obstruction or bowel wall thickening. No abscess in the abdomen pelvis. No periappendiceal region inflammatory change."  01/11/2019 PET scan revealed "Bilateral axillary and right subpectoral adenopathy noted with Deauville 5 disease on the left and Deauville 4 disease on the right in this region. Diffuse uniform endometrial activity with SUV of 13.2. Although this is very high for physiologic endometrial activity, the diffuse uniform distribution is more characteristic of benign physiologic endometrial activity rather than malignancy. If the patient has abnormal uterine bleeding or if otherwise clinically warranted, pelvic sonography could be utilized for further characterization of the endometrium. Postoperative findings and low-grade metabolic activity in the vicinity of the excision biopsy of the left inguinal lymph node. No other hypermetabolic adenopathy in the abdomen/pelvis identified"  01/01/2019 biopsy sampling ([id number]) from the [facility] shows lymphocyte predominant Hodgkin's lymphoma.  04/27/2019 PET/CT scan ([id number]) which revealed "1. Significant response to therapy of lymphoma. Residual small bilateral axillary nodes with low-level hypermetabolism. (Deauville) 2. 2. 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 3. Right ovarian hypermetabolism is likely physiologic and may represent underlying corpus luteal cyst."  2. Iron deficiency anemia - hgb improved to 12.5 from 8.9 with IV iron.   PLAN: -Discussed pt labwork today, 09/28/19; all values are WNL except for Hgb at 11.4, MCH at 25.7, RDW at 15.9, Abs  Immature Granulocytes at 0.10K, Sodium at 133, Albumin at 3.2, Anion gap at 4. -Discussed  09/28/2019 LDH at 185 -Discussed 09/28/2019 Ferritin at 13, Goal Ferritin >=50 -Discussed 09/28/2019 Sed Rate is in WNL at 7 -Discussed trying PO liquid Iron Polysaccharide vs IV Iron - would recommend we try liquid Iron Polysaccharide first -Advised pt that she would need to be monitored closely if given IV Iron due to the risk of allergic reaction  -Recommend pt mix liquid Iron with juice or water to make it easier to take  -Advised pt that hormonal changes during pregnancy can affect immune response - will keep a close eye on symptoms  -Will continue to monitor anemia and lymphoma with clinic visits every 2 months  -Will avoid rpt imaging scan, if possible, during pregnancy -currently no lab or clinical evidence of lymphoma progression at this time. -Will begin pt on 150 mg liquid Iron Polysaccharide daily -Recommend pt continue to f/u with Dr. Landry Mellow as scheduled  -Advised pt to contact if she is unable to tolerate liquid Iron - will schedule IV Iron -Will see back in 2 months with labs    FOLLOW UP: RTC with Dr Irene Limbo with labs in 2 months  The total time spent in the appt was 20 minutes and more than 50% was on counseling and direct patient cares.  All of the patient's questions were answered with apparent satisfaction. The patient knows to call the clinic with any problems, questions or concerns.    Sullivan Lone MD [place] AAHIVMS [facility] Hematology/Oncology Physician [facility]  (Office):       [phone] (Work cell):  [phone] (Fax):           [phone]  09/28/2019 4:50 PM   I, Yevette Edwards, am acting as a scribe for Dr. Sullivan Lone.   .I have reviewed the above documentation for accuracy and completeness, and I agree with the above. Brunetta Genera MD

## 2019-10-01 ENCOUNTER — Ambulatory Visit: Payer: Medicaid Other | Admitting: Hematology

## 2019-10-01 ENCOUNTER — Other Ambulatory Visit: Payer: Medicaid Other

## 2019-10-07 ENCOUNTER — Telehealth: Payer: Self-pay | Admitting: Hematology

## 2019-10-07 NOTE — Telephone Encounter (Signed)
Scheduled per 03/30 los, patient has been called and notified. ?

## 2019-10-27 ENCOUNTER — Encounter (HOSPITAL_COMMUNITY): Payer: Self-pay | Admitting: Obstetrics and Gynecology

## 2019-11-23 ENCOUNTER — Other Ambulatory Visit: Payer: Self-pay | Admitting: *Deleted

## 2019-11-23 DIAGNOSIS — C8108 Nodular lymphocyte predominant Hodgkin lymphoma, lymph nodes of multiple sites: Secondary | ICD-10-CM

## 2019-11-23 DIAGNOSIS — D500 Iron deficiency anemia secondary to blood loss (chronic): Secondary | ICD-10-CM

## 2019-11-24 ENCOUNTER — Inpatient Hospital Stay (HOSPITAL_BASED_OUTPATIENT_CLINIC_OR_DEPARTMENT_OTHER): Payer: Medicaid Other | Admitting: Hematology

## 2019-11-24 ENCOUNTER — Other Ambulatory Visit: Payer: Self-pay

## 2019-11-24 ENCOUNTER — Inpatient Hospital Stay: Payer: Medicaid Other | Attending: Hematology

## 2019-11-24 VITALS — BP 117/72 | HR 87 | Temp 97.7°F | Resp 20 | Ht 63.0 in | Wt 135.4 lb

## 2019-11-24 DIAGNOSIS — D509 Iron deficiency anemia, unspecified: Secondary | ICD-10-CM | POA: Insufficient documentation

## 2019-11-24 DIAGNOSIS — D500 Iron deficiency anemia secondary to blood loss (chronic): Secondary | ICD-10-CM

## 2019-11-24 DIAGNOSIS — C8108 Nodular lymphocyte predominant Hodgkin lymphoma, lymph nodes of multiple sites: Secondary | ICD-10-CM | POA: Diagnosis not present

## 2019-11-24 DIAGNOSIS — Z8572 Personal history of non-Hodgkin lymphomas: Secondary | ICD-10-CM | POA: Insufficient documentation

## 2019-11-24 LAB — CMP (CANCER CENTER ONLY)
ALT: 18 U/L (ref 0–44)
AST: 28 U/L (ref 15–41)
Albumin: 2.6 g/dL — ABNORMAL LOW (ref 3.5–5.0)
Alkaline Phosphatase: 119 U/L (ref 38–126)
Anion gap: 7 (ref 5–15)
BUN: 5 mg/dL — ABNORMAL LOW (ref 6–20)
CO2: 25 mmol/L (ref 22–32)
Calcium: 8.6 mg/dL — ABNORMAL LOW (ref 8.9–10.3)
Chloride: 106 mmol/L (ref 98–111)
Creatinine: 0.59 mg/dL (ref 0.44–1.00)
GFR, Est AFR Am: >60 mL/min (ref >60)
GFR, Estimated: >60 mL/min (ref >60)
Glucose, Bld: 94 mg/dL (ref 70–99)
Potassium: 3.4 mmol/L — ABNORMAL LOW (ref 3.5–5.1)
Sodium: 138 mmol/L (ref 135–145)
Total Bilirubin: 0.3 mg/dL (ref 0.3–1.2)
Total Protein: 5.7 g/dL — ABNORMAL LOW (ref 6.5–8.1)

## 2019-11-24 LAB — CBC WITH DIFFERENTIAL (CANCER CENTER ONLY)
Abs Immature Granulocytes: 0.13 10*3/uL — ABNORMAL HIGH (ref 0.00–0.07)
Basophils Absolute: 0.1 10*3/uL (ref 0.0–0.1)
Basophils Relative: 1 %
Eosinophils Absolute: 0.2 10*3/uL (ref 0.0–0.5)
Eosinophils Relative: 2 %
HCT: 32.6 % — ABNORMAL LOW (ref 36.0–46.0)
Hemoglobin: 10.6 g/dL — ABNORMAL LOW (ref 12.0–15.0)
Immature Granulocytes: 2 %
Lymphocytes Relative: 16 %
Lymphs Abs: 1.3 10*3/uL (ref 0.7–4.0)
MCH: 24.5 pg — ABNORMAL LOW (ref 26.0–34.0)
MCHC: 32.5 g/dL (ref 30.0–36.0)
MCV: 75.5 fL — ABNORMAL LOW (ref 80.0–100.0)
Monocytes Absolute: 0.6 10*3/uL (ref 0.1–1.0)
Monocytes Relative: 8 %
Neutro Abs: 6.1 10*3/uL (ref 1.7–7.7)
Neutrophils Relative %: 71 %
Platelet Count: 182 10*3/uL (ref 150–400)
RBC: 4.32 MIL/uL (ref 3.87–5.11)
RDW: 16.8 % — ABNORMAL HIGH (ref 11.5–15.5)
WBC Count: 8.4 10*3/uL (ref 4.0–10.5)
nRBC: 0 % (ref 0.0–0.2)

## 2019-11-24 LAB — LACTATE DEHYDROGENASE: LDH: 180 U/L (ref 98–192)

## 2019-11-24 LAB — IRON AND TIBC
Iron: 15 ug/dL — ABNORMAL LOW (ref 41–142)
Saturation Ratios: 3 % — ABNORMAL LOW (ref 21–57)
TIBC: 498 ug/dL — ABNORMAL HIGH (ref 236–444)
UIBC: 482 ug/dL — ABNORMAL HIGH (ref 120–384)

## 2019-11-24 LAB — FERRITIN: Ferritin: 7 ng/mL — ABNORMAL LOW (ref 11–307)

## 2019-11-24 NOTE — Progress Notes (Signed)
HEMATOLOGY/ONCOLOGY CLINIC NOTE  Date of Service: 11/24/2019  Patient Care Team: [name], PA as PCP - General (Family Medicine)   CHIEF COMPLAINTS/PURPOSE OF CONSULTATION: Lymphocyte predominant hodgkins lymphoma   HISTORY OF PRESENTING ILLNESS:  Leah Gould is a wonderful 24 y.o. female who has been referred to [place] by Dr. Fanny Skates for evaluation and management of her newly diagnosed Lymphoproliferative disorder. The pt reports that she is doing well overall. She is accompanied today by her Mom, via [employer].  The pt notes that she first noticed her enlarged left inguinal lymph node in 2010, and notes that it was "really tiny." She presented to an urgent care initially at the time. She is unsure if the lymph node reduced in size.  The pt notes that she noticed her left inguinal lymph node being larger and more sensitive last summer 2019 after a dog jumped on her. She notes that after this, the lymph node continued growing in size. She notes that the lymph node began causing pain in February 2020. She notes that this lymph node would increase in size in correlation with her periods. The pt notes that she made an appointment to see Ellen Henri, [place] at [facility], who referred her to surgeon Dr. Dalbert Batman.   The pt denies noticing any other new lumps or bumps and denies fevers, chills, night sweats or unexpected weight loss. The pt notes that she has had some new SOB, which she notes improves after eating ice, and has occurred more frequently recently. She endorses ice picca symptoms. She feels that she has to take deep breaths at night when she "settles down."  She does continue to feel a sense of swelling in her left groin.  The pt also endorses some discomfort in her right, lower abdomen. She was seen to have a thickened bladder and right sided ovarian cyst on the 11/17/18 CT A/P, as noted below. She denies a history of kidney stones or UTIs. She also notes that she has  felt some discomfort since her miscarriage in late April.  The pt denies using any steroids recently. The pt reports that she has had stable asthma and uses an inhaler as needed, not used frequently, endorses seasonal correlation. The pt also notes that she has been anemic since 2010, thought to be related to iron deficiency and endorses heavier periods. She notes that her periods can last up to 7-8 days which is her baseline. She has been taking PO Iron replacement for the last two months, denies other previous iron replacement. She has never had a blood transfusion.   The pt notes that she was pregnant once before and had a miscarriage about two months ago. She notes this occurred around 8-[redacted] weeks gestation, not followed by D&C.   The pt denies past surgeries and denies any medication allergies. She is not taking any chronic medications.  Of note prior to the patient's visit today, pt has had a CT A/P completed on 11/17/18 with results revealing "Enlarged presumed lymph node in the left inguinal region measuring 5.2 x 3.9 x 3.8 cm. There is enhancement of this lymph node. Etiology for this localized lymph node prominence is uncertain. Tissue sampling may well be warranted given this finding. No other adenopathy noted in the abdomen or pelvis. 2.  Spleen does not appear appreciably enlarged. 3. Wall thickening in the urinary bladder. Suspect a degree of cystitis. No renal or ureteral calculus. No hydronephrosis. 4. Apparent recent ovarian cyst  rupture on the right with mild fluid tracking from the right adnexa and cul-de-sac. 5. No evident bowel obstruction or bowel wall thickening. No abscess in the abdomen pelvis. No periappendiceal region inflammatory change."  Most recent lab results (623/20) of CBC w/diff and CMP is as follows: all values are WNL except for WBC at 3.5k, HGB at 9.4, HCT at 32.5, MCV at 65.7, MCH at 19.0, MCHC at 28.9, RDW at 21.3, ANC at 1.5k, Potassium at 3.3.  On review of systems,  pt reports some SOB, discomfort in right lower abdomen, left inguinal swelling, and denies fevers, chills, night sweats, unexpected weight loss, noticing any other lumps or bumps, mouth soreness, leg swelling, skin rashes, itching, and any other symptoms.   On PMHx the pt reports asthma and iron deficiency anemia. One pregnancy with miscarriage at 8-9 weeks. On Social Hx the pt denies smoking cigarettes and denies alcohol consumption. She works at [employer] as a Scientist, water quality. On Family Hx the pt denies blood disorders nor cancers.   INTERVAL HISTORY:  Leah Gould is here today for follow up and treatment of her NLPHL. The patient's last visit with [place] was on 09/28/2019. The pt reports that she is doing well overall. She is now at [redacted] weeks gestation. No acute new symptoms. No fevers/chills/new lumps or bumps, night sweats or other new focal symptoms.  MEDICAL HISTORY:  Past Medical History:  Diagnosis Date  . Anemia   . Asthma    no meds, allergy induced  . Hodgkin's lymphoma ([place])   . Inguinal adenopathy 12/22/2018    SURGICAL HISTORY: Past Surgical History:  Procedure Laterality Date  . MASS EXCISION Left 12/22/2018   Procedure: EXCISION DEEP LEFT INGUINAL LYMPH NODE;  Surgeon: Fanny Skates, MD;  Location: [facility];  Service: General;  Laterality: Left;    SOCIAL HISTORY: Social History   Socioeconomic History  . Marital status: Single    Spouse name: Not on file  . Number of children: Not on file  . Years of education: Not on file  . Highest education level: Not on file  Occupational History  . Not on file  Tobacco Use  . Smoking status: Never Smoker  . Smokeless tobacco: Never Used  Substance and Sexual Activity  . Alcohol use: No  . Drug use: No  . Sexual activity: Yes    Birth control/protection: None  Other Topics Concern  . Not on file  Social History Narrative  . Not on file   Social Determinants of Health   Financial Resource Strain:     . Difficulty of Paying Living Expenses:   Food Insecurity:   . Worried About Charity fundraiser in the Last Year:   . Arboriculturist in the Last Year:   Transportation Needs:   . Film/video editor (Medical):   Marland Kitchen Lack of Transportation (Non-Medical):   Physical Activity:   . Days of Exercise per Week:   . Minutes of Exercise per Session:   Stress:   . Feeling of Stress :   Social Connections:   . Frequency of Communication with Friends and Family:   . Frequency of Social Gatherings with Friends and Family:   . Attends Religious Services:   . Active Member of Clubs or Organizations:   . Attends Archivist Meetings:   Marland Kitchen Marital Status:   Intimate Partner Violence:   . Fear of Current or Ex-Partner:   . Emotionally Abused:   Marland Kitchen Physically Abused:   .  Sexually Abused:     FAMILY HISTORY: No family history on file.  ALLERGIES:  is allergic to fruit & vegetable daily [nutritional supplements] and peanut-containing drug products.  MEDICATIONS:  Current Outpatient Medications  Medication Sig Dispense Refill  . docusate sodium (COLACE) 100 MG capsule Take 1 capsule (100 mg total) by mouth every 12 (twelve) hours. (Patient not taking: Reported on 08/03/2019) 60 capsule 0  . metroNIDAZOLE (FLAGYL) 500 MG tablet Take 1 tablet (500 mg total) by mouth 2 (two) times daily. (Patient not taking: Reported on 08/03/2019) 14 tablet 0  . polyethylene glycol (MIRALAX) 17 g packet Take 17 g by mouth daily as needed for moderate constipation. (Patient not taking: Reported on 08/03/2019) 14 each 0  . Polysaccharide Iron Complex 100 MG/5ML LIQD Take 100 mg by mouth 2 (two) times daily with a meal. 300 mL 1  . Prenatal Vit-Fe Fumarate-FA (PRENATAL VITAMINS PO) Take 1 capsule by mouth daily.     No current facility-administered medications for this visit.    REVIEW OF SYSTEMS:   A 10+ POINT REVIEW OF SYSTEMS WAS OBTAINED including neurology, dermatology, psychiatry, cardiac, respiratory,  lymph, extremities, GI, GU, Musculoskeletal, constitutional, breasts, reproductive, HEENT.  All pertinent positives are noted in the HPI.  All others are negative.   PHYSICAL EXAMINATION: ECOG PERFORMANCE STATUS: 0 - Asymptomatic  Vitals:   11/24/19 1401  BP: 117/72  Pulse: 87  Resp: 20  Temp: 97.7 F (36.5 C)  SpO2: 100%   Filed Weights   11/24/19 1401  Weight: 135 lb 6.4 oz (61.4 kg)   Body mass index is 23.99 kg/m.   Marland Kitchen GENERAL:alert, in no acute distress and comfortable SKIN: no acute rashes, no significant lesions EYES: conjunctiva are pink and non-injected, sclera anicteric OROPHARYNX: MMM, no exudates, no oropharyngeal erythema or ulceration NECK: supple, no JVD LYMPH:  no palpable lymphadenopathy in the cervical, axillary or inguinal regions LUNGS: clear to auscultation b/l with normal respiratory effort HEART: regular rate & rhythm ABDOMEN:  normoactive bowel sounds , non tender,gravid uterus Extremity: no pedal edema PSYCH: alert & oriented x 3 with fluent speech NEURO: no focal motor/sensory deficits    LABORATORY DATA:  I have reviewed the data as listed  CBC Latest Ref Rng & Units 11/24/2019 09/28/2019 08/03/2019  WBC 4.0 - 10.5 K/uL 8.4 8.4 6.3  Hemoglobin 12.0 - 15.0 g/dL 10.6(L) 11.4(L) 11.4(L)  Hematocrit 36.0 - 46.0 % 32.6(L) 36.3 34.1(L)  Platelets 150 - 400 K/uL 182 228 209   CMP Latest Ref Rng & Units 11/24/2019 09/28/2019 08/03/2019  Glucose 70 - 99 mg/dL 94 81 82  BUN 6 - 20 mg/dL 5(L) 7 8  Creatinine 0.44 - 1.00 mg/dL 0.59 0.67 0.69  Sodium 135 - 145 mmol/L 138 133(L) 138  Potassium 3.5 - 5.1 mmol/L 3.4(L) 3.8 3.8  Chloride 98 - 111 mmol/L 106 106 107  CO2 22 - 32 mmol/L 25 23 23   Calcium 8.9 - 10.3 mg/dL 8.6(L) 9.0 9.2  Total Protein 6.5 - 8.1 g/dL 5.7(L) 6.8 6.6  Total Bilirubin 0.3 - 1.2 mg/dL 0.3 0.3 <0.2(L)  Alkaline Phos 38 - 126 U/L 119 83 66  AST 15 - 41 U/L 28 23 18   ALT 0 - 44 U/L 18 18 18    . Lab Results  Component Value Date    IRON 15 (L) 11/24/2019   TIBC 498 (H) 11/24/2019   IRONPCTSAT 3 (L) 11/24/2019   (Iron and TIBC)  Lab Results  Component Value Date  FERRITIN 7 (L) 11/24/2019     01/01/2019 Lymph Node Biopsy ON:[id number])   12/22/18 Left Inguinal LN Biopsy:     RADIOGRAPHIC STUDIES: I have personally reviewed the radiological images as listed and agreed with the findings in the report. No results found.  ASSESSMENT & PLAN:  Leah Gould is a 24 y.o. black female with  1. 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 The sample was sent for a second opinion and is awaiting further characterization for confirmation.  11/17/18 CT A/P which revealed "Enlarged presumed lymph node in the left inguinal region measuring 5.2 x 3.9 x 3.8 cm. There is enhancement of this lymph node. Etiology for this localized lymph node prominence is uncertain. Tissue sampling may well be warranted given this finding. No other adenopathy noted in the abdomen or pelvis. 2.  Spleen does not appear appreciably enlarged. 3. Wall thickening in the urinary bladder. Suspect a degree of cystitis. No renal or ureteral calculus. No hydronephrosis. 4. Apparent recent ovarian cyst rupture on the right with mild fluid tracking from the right adnexa and cul-de-sac. 5. No evident bowel obstruction or bowel wall thickening. No abscess in the abdomen pelvis. No periappendiceal region inflammatory change."  01/11/2019 PET scan revealed "Bilateral axillary and right subpectoral adenopathy noted with Deauville 5 disease on the left and Deauville 4 disease on the right in this region. Diffuse uniform endometrial activity with SUV of 13.2. Although this is very high for physiologic endometrial activity, the diffuse  uniform distribution is more characteristic of benign physiologic endometrial activity rather than malignancy. If the patient has abnormal uterine bleeding or if otherwise clinically warranted, pelvic sonography could be utilized for further characterization of the endometrium. Postoperative findings and low-grade metabolic activity in the vicinity of the excision biopsy of the left inguinal lymph node. No other hypermetabolic adenopathy in the abdomen/pelvis identified"  01/01/2019 biopsy sampling ([id number]) from the [facility] shows lymphocyte predominant Hodgkin's lymphoma.  04/27/2019 PET/CT scan ([id number]) which revealed "1. Significant response to therapy of lymphoma. Residual small bilateral axillary nodes with low-level hypermetabolism. (Deauville) 2. 2. 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 3. Right ovarian hypermetabolism is likely physiologic and may represent underlying corpus luteal cyst."  2. Iron deficiency anemia - hgb improved to 12.5 from 8.9 with IV iron.   PLAN: - labs reviewed - mild anemia and iron deficiency. -Goal Ferritin >=50 -patient has not been taking iron replacement po regularly - she was recommend to make sure she takes  Iron polysaccharide 100-150mg  po BID -no clinical or lab evidence of progression of her nodular lymphocyte predominant Hodgkins lymphoma at this time.   FOLLOW UP: RTC with Dr Irene Limbo with labs in 4 months  The total time spent in the appt was 20 minutes and more than 50% was on counseling and direct patient cares.  All of the patient's questions were answered with apparent satisfaction. The patient knows to call the clinic with any problems, questions or concerns.    Sullivan Lone MD [place] AAHIVMS [facility] Hematology/Oncology Physician [facility]  (Office):       [phone] (Work cell):  [phone] (Fax):           [phone]  11/24/2019 7:54 AM   I, Yevette Edwards, am acting  as a scribe for Dr. Sullivan Lone.   .I have reviewed the above documentation for accuracy and completeness, and I agree with the above. Brunetta Genera MD

## 2019-11-26 ENCOUNTER — Telehealth: Payer: Self-pay | Admitting: Hematology

## 2019-11-26 NOTE — Telephone Encounter (Signed)
Scheduled per los, patient has been called and notified. 

## 2019-12-14 LAB — OB RESULTS CONSOLE GBS: GBS: POSITIVE

## 2020-01-06 ENCOUNTER — Telehealth (HOSPITAL_COMMUNITY): Payer: Self-pay | Admitting: *Deleted

## 2020-01-06 NOTE — Telephone Encounter (Signed)
Preadmission screen  

## 2020-01-07 ENCOUNTER — Telehealth (HOSPITAL_COMMUNITY): Payer: Self-pay | Admitting: *Deleted

## 2020-01-07 NOTE — Telephone Encounter (Signed)
Preadmission screen  

## 2020-01-09 ENCOUNTER — Inpatient Hospital Stay (HOSPITAL_COMMUNITY)
Admission: AD | Admit: 2020-01-09 | Discharge: 2020-01-09 | Disposition: A | Discharge status: Home / Self Care | Payer: Medicaid Other | Attending: Obstetrics and Gynecology | Admitting: Obstetrics and Gynecology

## 2020-01-09 ENCOUNTER — Encounter (HOSPITAL_COMMUNITY): Payer: Self-pay | Admitting: Obstetrics and Gynecology

## 2020-01-09 ENCOUNTER — Other Ambulatory Visit: Payer: Self-pay

## 2020-01-09 DIAGNOSIS — O480 Post-term pregnancy: Secondary | ICD-10-CM | POA: Insufficient documentation

## 2020-01-09 DIAGNOSIS — O471 False labor at or after 37 completed weeks of gestation: Secondary | ICD-10-CM | POA: Insufficient documentation

## 2020-01-09 DIAGNOSIS — Z3A40 40 weeks gestation of pregnancy: Secondary | ICD-10-CM | POA: Insufficient documentation

## 2020-01-09 DIAGNOSIS — O479 False labor, unspecified: Secondary | ICD-10-CM

## 2020-01-09 NOTE — MAU Note (Signed)
Pt reports she has been having ctx since yesterday morning. Had spotting but bleeding now is more and bright red. Good fetal movement felt. ctx 5-8 min apart.  Not Dilated on Thursday appointment.

## 2020-01-09 NOTE — Discharge Instructions (Signed)
Fetal Movement Counts Patient Name: ________________________________________________ Patient Due Date: ____________________ What is a fetal movement count?  A fetal movement count is the number of times that you feel your baby move during a certain amount of time. This may also be called a fetal kick count. A fetal movement count is recommended for every pregnant woman. You may be asked to start counting fetal movements as early as week 28 of your pregnancy. Pay attention to when your baby is most active. You may notice your baby's sleep and wake cycles. You may also notice things that make your baby move more. You should do a fetal movement count:  When your baby is normally most active.  At the same time each day. A good time to count movements is while you are resting, after having something to eat and drink. How do I count fetal movements? 1. Find a quiet, comfortable area. Sit, or lie down on your side. 2. Write down the date, the start time and stop time, and the number of movements that you felt between those two times. Take this information with you to your health care visits. 3. Write down your start time when you feel the first movement. 4. Count kicks, flutters, swishes, rolls, and jabs. You should feel at least 10 movements. 5. You may stop counting after you have felt 10 movements, or if you have been counting for 2 hours. Write down the stop time. 6. If you do not feel 10 movements in 2 hours, contact your health care provider for further instructions. Your health care provider may want to do additional tests to assess your baby's well-being. Contact a health care provider if:  You feel fewer than 10 movements in 2 hours.  Your baby is not moving like he or she usually does. Date: ____________ Start time: ____________ Stop time: ____________ Movements: ____________ Date: ____________ Start time: ____________ Stop time: ____________ Movements: ____________ Date: ____________  Start time: ____________ Stop time: ____________ Movements: ____________ Date: ____________ Start time: ____________ Stop time: ____________ Movements: ____________ Date: ____________ Start time: ____________ Stop time: ____________ Movements: ____________ Date: ____________ Start time: ____________ Stop time: ____________ Movements: ____________ Date: ____________ Start time: ____________ Stop time: ____________ Movements: ____________ Date: ____________ Start time: ____________ Stop time: ____________ Movements: ____________ Date: ____________ Start time: ____________ Stop time: ____________ Movements: ____________ This information is not intended to replace advice given to you by your health care provider. Make sure you discuss any questions you have with your health care provider. Document Revised: 02/04/2019 Document Reviewed: 02/04/2019 Elsevier Patient Education  2020 Else[employer] Inc[employer]        Signs and Symptoms of Labor Labor is your body's natural process of moving your baby, placenta, and umbilical cord out of your uterus. The process of labor usually starts when your baby is full-term, between 37 and 40 weeks of pregnancy. How will I know when I am close to going into labor? As your body prepares for labor and the birth of your baby, you may notice the following symptoms in the weeks and days before true labor starts:  Having a strong desire to get your home ready to receive your new baby. This is called nesting. Nesting may be a sign that labor is approaching, and it may occur several weeks before birth. Nesting may involve cleaning and organizing your home.  Passing a small amount of thick, bloody mucus out of your vagina (normal bloody show or losing your mucus plug). This may happen more than a   week before labor begins, or it might occur right before labor begins as the opening of the cervix starts to widen (dilate). For some women, the entire mucus plug passes at once. For others,  smaller portions of the mucus plug may gradually pass over several days.  Your baby moving (dropping) lower in your pelvis to get into position for birth (lightening). When this happens, you may feel more pressure on your bladder and pelvic bone and less pressure on your ribs. This may make it easier to breathe. It may also cause you to need to urinate more often and have problems with bowel movements.  Having "practice contractions" (Braxton Hicks contractions) that occur at irregular (unevenly spaced) intervals that are more than 10 minutes apart. This is also called false labor. False labor contractions are common after exercise or sexual activity, and they will stop if you change position, rest, or drink fluids. These contractions are usually mild and do not get stronger over time. They may feel like: ? A backache or back pain. ? Mild cramps, similar to menstrual cramps. ? Tightening or pressure in your abdomen. Other early symptoms that labor may be starting soon include:  Nausea or loss of appetite.  Diarrhea.  Having a sudden burst of energy, or feeling very tired.  Mood changes.  Having trouble sleeping. How will I know when labor has begun? Signs that true labor has begun may include:  Having contractions that come at regular (evenly spaced) intervals and increase in intensity. This may feel like more intense tightening or pressure in your abdomen that moves to your back. ? Contractions may also feel like rhythmic pain in your upper thighs or back that comes and goes at regular intervals. ? For first-time mothers, this change in intensity of contractions often occurs at a more gradual pace. ? Women who have given birth before may notice a more rapid progression of contraction changes.  Having a feeling of pressure in the vaginal area.  Your water breaking (rupture of membranes). This is when the sac of fluid that surrounds your baby breaks. When this happens, you will notice  fluid leaking from your vagina. This may be clear or blood-tinged. Labor usually starts within 24 hours of your water breaking, but it may take longer to begin. ? Some women notice this as a gush of fluid. ? Others notice that their underwear repeatedly becomes damp. Follow these instructions at home:   When labor starts, or if your water breaks, call your health care provider or nurse care line. Based on your situation, they will determine when you should go in for an exam.  When you are in early labor, you may be able to rest and manage symptoms at home. Some strategies to try at home include: ? Breathing and relaxation techniques. ? Taking a warm bath or shower. ? Listening to music. ? Using a heating pad on the lower back for pain. If you are directed to use heat:  Place a towel between your skin and the heat source.  Leave the heat on for 20-30 minutes.  Remove the heat if your skin turns bright red. This is especially important if you are unable to feel pain, heat, or cold. You may have a greater risk of getting burned. Get help right away if:  You have painful, regular contractions that are 5 minutes apart or less.  Labor starts before you are [redacted] weeks along in your pregnancy.  You have a fever.  You have   a headache that does not go away.  You have bright red blood coming from your vagina.  You do not feel your baby moving.  You have a sudden onset of: ? Severe headache with vision problems. ? Nausea, vomiting, or diarrhea. ? Chest pain or shortness of breath. These symptoms may be an emergency. If your health care provider recommends that you go to the hospital or birth center where you plan to deliver, do not drive yourself. Have someone else drive you, or call emergency services (911 in the [place] Summary  Labor is your body's natural process of moving your baby, placenta, and umbilical cord out of your uterus.  The process of labor usually starts when your baby is  full-term, between 37 and 40 weeks of pregnancy.  When labor starts, or if your water breaks, call your health care provider or nurse care line. Based on your situation, they will determine when you should go in for an exam. This information is not intended to replace advice given to you by your health care provider. Make sure you discuss any questions you have with your health care provider. Document Revised: 03/17/2017 Document Reviewed: 11/22/2016 Elsevier Patient Education  2020 Elsevier Inc.  

## 2020-01-09 NOTE — MAU Provider Note (Signed)
S: Patient is here for RN labor evaluation. Strip, vital signs, & chart Reviewed   O:  Vitals:   01/09/20 0629 01/09/20 0756  BP: 122/77 131/80  Pulse: 82 83  Resp: 18   Temp: 98.4 F (36.9 C)   Weight: 65.3 kg   Height: 5\' 3"  (1.6 m)    No results found for this or any previous visit (from the past 24 hour(s)).  Dilation: 1 Effacement (%): 80 Cervical Position: Posterior Station: -2 Presentation: Vertex Exam by:: Fredda Hammed RN    FHR: 125 bpm, Mod Var, no Decels, 15x15 Accels UC: irregular, 4-10 mins   A: 1. False labor   2. [redacted] weeks gestation of pregnancy      P:  RN to discharge home in stable condition with return precautions & fetal kick counts  Jorje Guild FNP 8:00 AM

## 2020-01-10 ENCOUNTER — Encounter (HOSPITAL_COMMUNITY): Payer: Self-pay | Admitting: Obstetrics and Gynecology

## 2020-01-10 ENCOUNTER — Inpatient Hospital Stay (EMERGENCY_DEPARTMENT_HOSPITAL)
Admission: AD | Admit: 2020-01-10 | Discharge: 2020-01-10 | Disposition: A | Discharge status: Home / Self Care | Payer: Medicaid Other | Source: Home / Self Care | Attending: Obstetrics and Gynecology | Admitting: Obstetrics and Gynecology

## 2020-01-10 ENCOUNTER — Telehealth (HOSPITAL_COMMUNITY): Payer: Self-pay | Admitting: *Deleted

## 2020-01-10 ENCOUNTER — Other Ambulatory Visit: Payer: Self-pay

## 2020-01-10 DIAGNOSIS — O471 False labor at or after 37 completed weeks of gestation: Secondary | ICD-10-CM

## 2020-01-10 DIAGNOSIS — O479 False labor, unspecified: Secondary | ICD-10-CM

## 2020-01-10 DIAGNOSIS — Z3A40 40 weeks gestation of pregnancy: Secondary | ICD-10-CM

## 2020-01-10 NOTE — Discharge Instructions (Signed)
Fetal Movement Counts Patient Name: ________________________________________________ Patient Due Date: ____________________ What is a fetal movement count?  A fetal movement count is the number of times that you feel your baby move during a certain amount of time. This may also be called a fetal kick count. A fetal movement count is recommended for every pregnant woman. You may be asked to start counting fetal movements as early as week 28 of your pregnancy. Pay attention to when your baby is most active. You may notice your baby's sleep and wake cycles. You may also notice things that make your baby move more. You should do a fetal movement count:  When your baby is normally most active.  At the same time each day. A good time to count movements is while you are resting, after having something to eat and drink. How do I count fetal movements? 1. Find a quiet, comfortable area. Sit, or lie down on your side. 2. Write down the date, the start time and stop time, and the number of movements that you felt between those two times. Take this information with you to your health care visits. 3. Write down your start time when you feel the first movement. 4. Count kicks, flutters, swishes, rolls, and jabs. You should feel at least 10 movements. 5. You may stop counting after you have felt 10 movements, or if you have been counting for 2 hours. Write down the stop time. 6. If you do not feel 10 movements in 2 hours, contact your health care provider for further instructions. Your health care provider may want to do additional tests to assess your baby's well-being. Contact a health care provider if:  You feel fewer than 10 movements in 2 hours.  Your baby is not moving like he or she usually does. Date: ____________ Start time: ____________ Stop time: ____________ Movements: ____________ Date: ____________ Start time: ____________ Stop time: ____________ Movements: ____________ Date: ____________  Start time: ____________ Stop time: ____________ Movements: ____________ Date: ____________ Start time: ____________ Stop time: ____________ Movements: ____________ Date: ____________ Start time: ____________ Stop time: ____________ Movements: ____________ Date: ____________ Start time: ____________ Stop time: ____________ Movements: ____________ Date: ____________ Start time: ____________ Stop time: ____________ Movements: ____________ Date: ____________ Start time: ____________ Stop time: ____________ Movements: ____________ Date: ____________ Start time: ____________ Stop time: ____________ Movements: ____________ This information is not intended to replace advice given to you by your health care provider. Make sure you discuss any questions you have with your health care provider. Document Revised: 02/04/2019 Document Reviewed: 02/04/2019 Elsevier Patient Education  2020 Else[employer] Inc[employer]        Signs and Symptoms of Labor Labor is your body's natural process of moving your baby, placenta, and umbilical cord out of your uterus. The process of labor usually starts when your baby is full-term, between 37 and 40 weeks of pregnancy. How will I know when I am close to going into labor? As your body prepares for labor and the birth of your baby, you may notice the following symptoms in the weeks and days before true labor starts:  Having a strong desire to get your home ready to receive your new baby. This is called nesting. Nesting may be a sign that labor is approaching, and it may occur several weeks before birth. Nesting may involve cleaning and organizing your home.  Passing a small amount of thick, bloody mucus out of your vagina (normal bloody show or losing your mucus plug). This may happen more than a   week before labor begins, or it might occur right before labor begins as the opening of the cervix starts to widen (dilate). For some women, the entire mucus plug passes at once. For others,  smaller portions of the mucus plug may gradually pass over several days.  Your baby moving (dropping) lower in your pelvis to get into position for birth (lightening). When this happens, you may feel more pressure on your bladder and pelvic bone and less pressure on your ribs. This may make it easier to breathe. It may also cause you to need to urinate more often and have problems with bowel movements.  Having "practice contractions" (Braxton Hicks contractions) that occur at irregular (unevenly spaced) intervals that are more than 10 minutes apart. This is also called false labor. False labor contractions are common after exercise or sexual activity, and they will stop if you change position, rest, or drink fluids. These contractions are usually mild and do not get stronger over time. They may feel like: ? A backache or back pain. ? Mild cramps, similar to menstrual cramps. ? Tightening or pressure in your abdomen. Other early symptoms that labor may be starting soon include:  Nausea or loss of appetite.  Diarrhea.  Having a sudden burst of energy, or feeling very tired.  Mood changes.  Having trouble sleeping. How will I know when labor has begun? Signs that true labor has begun may include:  Having contractions that come at regular (evenly spaced) intervals and increase in intensity. This may feel like more intense tightening or pressure in your abdomen that moves to your back. ? Contractions may also feel like rhythmic pain in your upper thighs or back that comes and goes at regular intervals. ? For first-time mothers, this change in intensity of contractions often occurs at a more gradual pace. ? Women who have given birth before may notice a more rapid progression of contraction changes.  Having a feeling of pressure in the vaginal area.  Your water breaking (rupture of membranes). This is when the sac of fluid that surrounds your baby breaks. When this happens, you will notice  fluid leaking from your vagina. This may be clear or blood-tinged. Labor usually starts within 24 hours of your water breaking, but it may take longer to begin. ? Some women notice this as a gush of fluid. ? Others notice that their underwear repeatedly becomes damp. Follow these instructions at home:   When labor starts, or if your water breaks, call your health care provider or nurse care line. Based on your situation, they will determine when you should go in for an exam.  When you are in early labor, you may be able to rest and manage symptoms at home. Some strategies to try at home include: ? Breathing and relaxation techniques. ? Taking a warm bath or shower. ? Listening to music. ? Using a heating pad on the lower back for pain. If you are directed to use heat:  Place a towel between your skin and the heat source.  Leave the heat on for 20-30 minutes.  Remove the heat if your skin turns bright red. This is especially important if you are unable to feel pain, heat, or cold. You may have a greater risk of getting burned. Get help right away if:  You have painful, regular contractions that are 5 minutes apart or less.  Labor starts before you are [redacted] weeks along in your pregnancy.  You have a fever.  You have   a headache that does not go away.  You have bright red blood coming from your vagina.  You do not feel your baby moving.  You have a sudden onset of: ? Severe headache with vision problems. ? Nausea, vomiting, or diarrhea. ? Chest pain or shortness of breath. These symptoms may be an emergency. If your health care provider recommends that you go to the hospital or birth center where you plan to deliver, do not drive yourself. Have someone else drive you, or call emergency services (911 in the [place] Summary  Labor is your body's natural process of moving your baby, placenta, and umbilical cord out of your uterus.  The process of labor usually starts when your baby is  full-term, between 37 and 40 weeks of pregnancy.  When labor starts, or if your water breaks, call your health care provider or nurse care line. Based on your situation, they will determine when you should go in for an exam. This information is not intended to replace advice given to you by your health care provider. Make sure you discuss any questions you have with your health care provider. Document Revised: 03/17/2017 Document Reviewed: 11/22/2016 Elsevier Patient Education  2020 Elsevier Inc.  

## 2020-01-10 NOTE — Telephone Encounter (Signed)
Preadmission screen  

## 2020-01-10 NOTE — MAU Note (Signed)
Pt here with reports of contractions every 2-5 minutes apart. She denies LOF. Reports some bloody show. Good fetal movement. Cervix was 1cm yesterday.

## 2020-01-10 NOTE — MAU Provider Note (Signed)
S: Patient is here for RN labor evaluation. Strip, vital signs, & chart Reviewed   O:  Vitals:   01/10/20 0631 01/10/20 0634 01/10/20 0814  BP: 132/85 117/76 131/82  Pulse: 79 81   Resp: 18    Temp: 98.2 F (36.8 C)    TempSrc: Oral     No results found for this or any previous visit (from the past 24 hour(s)).  Dilation: 1.5 Effacement (%): 80 Cervical Position: Middle Station: -1 Presentation: Vertex Exam by:: B. Bowen, RN   FHR: 125 bpm, Mod Var, no Decels, 15x15 Accels UC: irregular ctx & UI   A: 1. False labor   2. [redacted] weeks gestation of pregnancy      P:  RN to discharge home in stable condition with return precautions & fetal kick counts  Jorje Guild FNP 8:10 AM

## 2020-01-11 ENCOUNTER — Encounter (HOSPITAL_COMMUNITY): Payer: Self-pay | Admitting: Obstetrics and Gynecology

## 2020-01-11 ENCOUNTER — Inpatient Hospital Stay (HOSPITAL_COMMUNITY)
Admission: AD | Admit: 2020-01-11 | Discharge: 2020-01-13 | DRG: 806 | Disposition: A | Discharge status: Home / Self Care | Payer: Medicaid Other | Attending: Obstetrics and Gynecology | Admitting: Obstetrics and Gynecology

## 2020-01-11 ENCOUNTER — Inpatient Hospital Stay (HOSPITAL_COMMUNITY): Payer: Medicaid Other | Admitting: Anesthesiology

## 2020-01-11 ENCOUNTER — Other Ambulatory Visit: Payer: Self-pay | Admitting: Obstetrics and Gynecology

## 2020-01-11 ENCOUNTER — Other Ambulatory Visit: Payer: Self-pay

## 2020-01-11 DIAGNOSIS — C8190 Hodgkin lymphoma, unspecified, unspecified site: Secondary | ICD-10-CM | POA: Diagnosis present

## 2020-01-11 DIAGNOSIS — O9A12 Malignant neoplasm complicating childbirth: Secondary | ICD-10-CM | POA: Diagnosis present

## 2020-01-11 DIAGNOSIS — O99824 Streptococcus B carrier state complicating childbirth: Secondary | ICD-10-CM | POA: Diagnosis present

## 2020-01-11 DIAGNOSIS — Z3A40 40 weeks gestation of pregnancy: Secondary | ICD-10-CM

## 2020-01-11 DIAGNOSIS — O26893 Other specified pregnancy related conditions, third trimester: Secondary | ICD-10-CM | POA: Diagnosis present

## 2020-01-11 DIAGNOSIS — Z20822 Contact with and (suspected) exposure to covid-19: Secondary | ICD-10-CM | POA: Diagnosis present

## 2020-01-11 LAB — CBC
HCT: 36.8 % (ref 36.0–46.0)
Hemoglobin: 11.8 g/dL — ABNORMAL LOW (ref 12.0–15.0)
MCH: 23.4 pg — ABNORMAL LOW (ref 26.0–34.0)
MCHC: 32.1 g/dL (ref 30.0–36.0)
MCV: 72.9 fL — ABNORMAL LOW (ref 80.0–100.0)
Platelets: 248 10*3/uL (ref 150–400)
RBC: 5.05 MIL/uL (ref 3.87–5.11)
RDW: 17.1 % — ABNORMAL HIGH (ref 11.5–15.5)
WBC: 12.2 10*3/uL — ABNORMAL HIGH (ref 4.0–10.5)
nRBC: 0 % (ref 0.0–0.2)

## 2020-01-11 LAB — TYPE AND SCREEN
ABO/RH(D): A POS
Antibody Screen: NEGATIVE

## 2020-01-11 LAB — SARS CORONAVIRUS 2 BY RT PCR (HOSPITAL ORDER, PERFORMED IN ~~LOC~~ HOSPITAL LAB): SARS Coronavirus 2: NEGATIVE

## 2020-01-11 MED ORDER — OXYCODONE-ACETAMINOPHEN 5-325 MG PO TABS: 2.0000 | ORAL_TABLET | ORAL | Status: DC | PRN

## 2020-01-11 MED ORDER — PHENYLEPHRINE 40 MCG/ML (10ML) SYRINGE FOR IV PUSH (FOR BLOOD PRESSURE SUPPORT): 80.0000 ug | PREFILLED_SYRINGE | INTRAVENOUS | Status: DC | PRN

## 2020-01-11 MED ORDER — OXYTOCIN-SODIUM CHLORIDE 30-0.9 UT/500ML-% IV SOLN
1.0000 m[IU]/min | INTRAVENOUS | Status: DC
  Administered 2020-01-11: 2 m[IU]/min via INTRAVENOUS

## 2020-01-11 MED ORDER — HYDROXYZINE HCL 50 MG PO TABS: 50.0000 mg | ORAL_TABLET | Freq: Four times a day (QID) | ORAL | Status: DC | PRN

## 2020-01-11 MED ORDER — EPHEDRINE 5 MG/ML INJ: 10.0000 mg | INTRAVENOUS | Status: DC | PRN

## 2020-01-11 MED ORDER — LACTATED RINGERS IV SOLN: INTRAVENOUS | Status: DC

## 2020-01-11 MED ORDER — SODIUM CHLORIDE 0.9 % IV SOLN
5.0000 10*6.[IU] | Freq: Once | INTRAVENOUS | Status: AC
  Administered 2020-01-11: 5 10*6.[IU] via INTRAVENOUS
  Filled 2020-01-11: qty 5

## 2020-01-11 MED ORDER — ACETAMINOPHEN 325 MG PO TABS: 650.0000 mg | ORAL_TABLET | ORAL | Status: DC | PRN

## 2020-01-11 MED ORDER — WITCH HAZEL-GLYCERIN EX PADS: 1.0000 "application " | MEDICATED_PAD | CUTANEOUS | Status: DC | PRN

## 2020-01-11 MED ORDER — COCONUT OIL OIL: 1.0000 "application " | TOPICAL_OIL | Status: DC | PRN

## 2020-01-11 MED ORDER — OXYCODONE-ACETAMINOPHEN 5-325 MG PO TABS: 1.0000 | ORAL_TABLET | ORAL | Status: DC | PRN

## 2020-01-11 MED ORDER — SIMETHICONE 80 MG PO CHEW: 80.0000 mg | CHEWABLE_TABLET | ORAL | Status: DC | PRN

## 2020-01-11 MED ORDER — FENTANYL CITRATE (PF) 100 MCG/2ML IJ SOLN
50.0000 ug | INTRAMUSCULAR | Status: DC | PRN
  Administered 2020-01-11: 100 ug via INTRAVENOUS
  Filled 2020-01-11: qty 2

## 2020-01-11 MED ORDER — ZOLPIDEM TARTRATE 5 MG PO TABS: 5.0000 mg | ORAL_TABLET | Freq: Every evening | ORAL | Status: DC | PRN

## 2020-01-11 MED ORDER — ONDANSETRON HCL 4 MG PO TABS: 4.0000 mg | ORAL_TABLET | ORAL | Status: DC | PRN

## 2020-01-11 MED ORDER — PRENATAL MULTIVITAMIN CH
1.0000 | ORAL_TABLET | Freq: Every day | ORAL | Status: DC
  Administered 2020-01-12: 1 via ORAL
  Filled 2020-01-11: qty 1

## 2020-01-11 MED ORDER — LACTATED RINGERS IV SOLN: 500.0000 mL | INTRAVENOUS | Status: DC | PRN

## 2020-01-11 MED ORDER — FENTANYL-BUPIVACAINE-NACL 0.5-0.125-0.9 MG/250ML-% EP SOLN
12.0000 mL/h | EPIDURAL | Status: DC | PRN
  Filled 2020-01-11: qty 250

## 2020-01-11 MED ORDER — DIPHENHYDRAMINE HCL 50 MG/ML IJ SOLN: 12.5000 mg | INTRAMUSCULAR | Status: DC | PRN

## 2020-01-11 MED ORDER — ONDANSETRON HCL 4 MG/2ML IJ SOLN: 4.0000 mg | INTRAMUSCULAR | Status: DC | PRN

## 2020-01-11 MED ORDER — ACETAMINOPHEN 325 MG PO TABS
650.0000 mg | ORAL_TABLET | ORAL | Status: DC | PRN
  Administered 2020-01-12: 650 mg via ORAL
  Filled 2020-01-11: qty 2

## 2020-01-11 MED ORDER — PENICILLIN G POT IN DEXTROSE 60000 UNIT/ML IV SOLN
3.0000 10*6.[IU] | INTRAVENOUS | Status: DC
  Administered 2020-01-11: 3 10*6.[IU] via INTRAVENOUS
  Filled 2020-01-11 (×2): qty 50

## 2020-01-11 MED ORDER — ONDANSETRON HCL 4 MG/2ML IJ SOLN: 4.0000 mg | Freq: Four times a day (QID) | INTRAMUSCULAR | Status: DC | PRN

## 2020-01-11 MED ORDER — TETANUS-DIPHTH-ACELL PERTUSSIS 5-2.5-18.5 LF-MCG/0.5 IM SUSP: 0.5000 mL | Freq: Once | INTRAMUSCULAR | Status: DC

## 2020-01-11 MED ORDER — SOD CITRATE-CITRIC ACID 500-334 MG/5ML PO SOLN: 30.0000 mL | ORAL | Status: DC | PRN

## 2020-01-11 MED ORDER — LACTATED RINGERS IV SOLN
500.0000 mL | Freq: Once | INTRAVENOUS | Status: AC
  Administered 2020-01-11: 500 mL via INTRAVENOUS

## 2020-01-11 MED ORDER — OXYTOCIN BOLUS FROM INFUSION
333.0000 mL | Freq: Once | INTRAVENOUS | Status: AC
  Administered 2020-01-11: 333 mL via INTRAVENOUS

## 2020-01-11 MED ORDER — LIDOCAINE HCL (PF) 1 % IJ SOLN: 30.0000 mL | INTRAMUSCULAR | Status: DC | PRN

## 2020-01-11 MED ORDER — LIDOCAINE HCL (PF) 1 % IJ SOLN
INTRAMUSCULAR | Status: DC | PRN
  Administered 2020-01-11: 5 mL via EPIDURAL
  Administered 2020-01-11: 4 mL via EPIDURAL

## 2020-01-11 MED ORDER — SENNOSIDES-DOCUSATE SODIUM 8.6-50 MG PO TABS
2.0000 | ORAL_TABLET | ORAL | Status: DC
  Administered 2020-01-12 (×2): 2 via ORAL
  Filled 2020-01-11 (×2): qty 2

## 2020-01-11 MED ORDER — BENZOCAINE-MENTHOL 20-0.5 % EX AERO
1.0000 "application " | INHALATION_SPRAY | CUTANEOUS | Status: DC | PRN
  Administered 2020-01-12: 1 via TOPICAL
  Filled 2020-01-11: qty 56

## 2020-01-11 MED ORDER — DIPHENHYDRAMINE HCL 25 MG PO CAPS: 25.0000 mg | ORAL_CAPSULE | Freq: Four times a day (QID) | ORAL | Status: DC | PRN

## 2020-01-11 MED ORDER — DIBUCAINE (PERIANAL) 1 % EX OINT: 1.0000 "application " | TOPICAL_OINTMENT | CUTANEOUS | Status: DC | PRN

## 2020-01-11 MED ORDER — IBUPROFEN 600 MG PO TABS
600.0000 mg | ORAL_TABLET | Freq: Four times a day (QID) | ORAL | Status: DC
  Administered 2020-01-12 – 2020-01-13 (×6): 600 mg via ORAL
  Filled 2020-01-11 (×6): qty 1

## 2020-01-11 MED ORDER — OXYTOCIN-SODIUM CHLORIDE 30-0.9 UT/500ML-% IV SOLN
2.5000 [IU]/h | INTRAVENOUS | Status: DC
  Filled 2020-01-11: qty 500

## 2020-01-11 MED ORDER — SODIUM CHLORIDE (PF) 0.9 % IJ SOLN
INTRAMUSCULAR | Status: DC | PRN
  Administered 2020-01-11: 12 mL/h via EPIDURAL

## 2020-01-11 NOTE — H&P (Signed)
Leah Gould is a 24 y.o. female G2P0010 at 40 wks and 2 days presenting for active labor. Pt seen in office this morning with contractions every 5 minutes. Cervix was 4/100/0 . Pt is gbs positive. Pregnancy has been uncomplicated. Pt has a h/o hodgkins lymphoma however she is in remission. Prenatal Care provided by Dr. Christophe Louis with [facility].   . OB History    Gravida  2   Para      Term      Preterm      AB  1   Living        SAB  1   TAB      Ectopic      Multiple      Live Births             Past Medical History:  Diagnosis Date  . Anemia   . Asthma    no meds, allergy induced  . Hodgkin's lymphoma ([place])   . Inguinal adenopathy 12/22/2018   Past Surgical History:  Procedure Laterality Date  . MASS EXCISION Left 12/22/2018   Procedure: EXCISION DEEP LEFT INGUINAL LYMPH NODE;  Surgeon: Fanny Skates, MD;  Location: [facility];  Service: General;  Laterality: Left;   Family History: family history is not on file. Social History:  reports that she has never smoked. She has never used smokeless tobacco. She reports that she does not drink alcohol and does not use drugs.     Maternal Diabetes: No Genetic Screening: Normal Maternal Ultrasounds/Referrals: Normal Fetal Ultrasounds or other Referrals:  None Maternal Substance Abuse:  No Significant Maternal Medications:  None Significant Maternal Lab Results:  Group B Strep positive Other Comments:  None  Review of Systems  Constitutional: Negative.   HENT: Negative.   Eyes: Negative.   Respiratory: Negative.   Cardiovascular: Negative.   Gastrointestinal: Negative.   Endocrine: Negative.   Genitourinary: Negative.   Musculoskeletal: Negative.   Skin: Negative.   Allergic/Immunologic: Negative.   Neurological: Negative.   Hematological: Negative.   Psychiatric/Behavioral: Negative.    History Dilation: 6 Effacement (%): 90 Exam by:: Krystal Eaton RN  Blood pressure 124/74,  pulse 69, temperature 97.6 F (36.4 C), temperature source Oral, resp. rate 16, height 5\' 3"  (1.6 m), weight 63.5 kg, last menstrual period 04/12/2019, SpO2 100 %. Maternal Exam:  Introitus: Normal vulva.   Physical Exam Vitals reviewed.  Constitutional:      Appearance: Normal appearance.  HENT:     Head: Normocephalic.  Eyes:     Pupils: Pupils are equal, round, and reactive to light.  Cardiovascular:     Rate and Rhythm: Normal rate and regular rhythm.     Pulses: Normal pulses.     Heart sounds: Normal heart sounds.  Pulmonary:     Effort: Pulmonary effort is normal.     Breath sounds: Normal breath sounds.  Abdominal:     Tenderness: There is no abdominal tenderness.  Genitourinary:    General: Normal vulva.  Musculoskeletal:        General: Normal range of motion.     Cervical back: Normal range of motion and neck supple.  Skin:    General: Skin is warm and dry.  Neurological:     Mental Status: She is alert.  Psychiatric:        Mood and Affect: Mood normal.        Behavior: Behavior normal.     Prenatal labs: ABO, Rh: --/--/A  POS (07/13 1204) Antibody: NEG (07/13 1204) Rubella: Immune (12/31 0000) RPR: Nonreactive (12/31 0000)  HBsAg: Negative (12/31 0000)  HIV: Non-reactive (12/31 0000)  GBS: Positive/-- (06/15 0000)   Assessment/Plan: 40 wks and 2 days in active labor  GBS +  Treated with penicillin H/O Hodgkins lymphoma- Pt is in remission.  Epidural for pain control.  Anticipate SVD    Christophe Louis 01/11/2020, 2:12 PM

## 2020-01-11 NOTE — Anesthesia Procedure Notes (Signed)
Epidural Patient location during procedure: OB Start time: 01/11/2020 1:21 PM End time: 01/11/2020 1:24 PM  Staffing Anesthesiologist: Audry Pili, MD Performed: anesthesiologist   Preanesthetic Checklist Completed: patient identified, IV checked, risks and benefits discussed, monitors and equipment checked, pre-op evaluation and timeout performed  Epidural Patient position: sitting Prep: DuraPrep Patient monitoring: continuous pulse ox and blood pressure Approach: midline Location: L3-L4 Injection technique: LOR saline  Needle:  Needle type: Tuohy  Needle gauge: 17 G Needle length: 9 cm Needle insertion depth: 4 cm Catheter size: 19 Gauge Catheter at skin depth: 9 cm Test dose: negative and Other (1% lidocaine)  Assessment Events: blood not aspirated  Additional Notes Patient identified. Risks including, but not limited to, bleeding, infection, nerve damage, paralysis, inadequate analgesia, blood pressure changes, nausea, vomiting, allergic reaction, postpartum back pain, itching, and headache were discussed. Patient expressed understanding and wished to proceed. Sterile prep and drape, including hand hygiene, mask, and sterile gloves were used. The patient was positioned and the spine was prepped. The skin was anesthetized with lidocaine. No paraesthesia or other complication noted. The patient did not experience any signs of intravascular injection such as tinnitus or metallic taste in mouth, nor signs of intrathecal spread such as rapid motor block. Please see nursing notes for vital signs. The patient tolerated the procedure well.   Renold Don, MDReason for block:procedure for pain

## 2020-01-11 NOTE — Anesthesia Preprocedure Evaluation (Signed)
Anesthesia Evaluation  Patient identified by MRN, date of birth, ID band Patient awake    Reviewed: Allergy & Precautions, NPO status , Patient's Chart, lab work & pertinent test results  History of Anesthesia Complications Negative for: history of anesthetic complications  Airway Mallampati: II   Neck ROM: Full    Dental   Pulmonary asthma ,    Pulmonary exam normal        Cardiovascular negative cardio ROS Normal cardiovascular exam     Neuro/Psych negative neurological ROS  negative psych ROS   GI/Hepatic negative GI ROS, Neg liver ROS,   Endo/Other  negative endocrine ROS  Renal/GU negative Renal ROS     Musculoskeletal negative musculoskeletal ROS (+)   Abdominal   Peds  Hematology  Hodgkin's lymphoma in remission Plt 248k    Anesthesia Other Findings Covid test in process   Reproductive/Obstetrics (+) Pregnancy                             Anesthesia Physical Anesthesia Plan  ASA: II  Anesthesia Plan: Epidural   Post-op Pain Management:    Induction:   PONV Risk Score and Plan: 2 and Treatment may vary due to age or medical condition  Airway Management Planned: Natural Airway  Additional Equipment: None  Intra-op Plan:   Post-operative Plan:   Informed Consent: I have reviewed the patients History and Physical, chart, labs and discussed the procedure including the risks, benefits and alternatives for the proposed anesthesia with the patient or authorized representative who has indicated his/her understanding and acceptance.       Plan Discussed with: Anesthesiologist  Anesthesia Plan Comments: (Labs reviewed. Platelets acceptable, patient not taking any blood thinning medications. Per RN, FHR tracing reported to be stable enough for sitting procedure. Risks and benefits discussed with patient, including PDPH, backache, epidural hematoma, failed epidural, blood  pressure changes, allergic reaction, and nerve injury. Patient expressed understanding and wished to proceed.)        Anesthesia Quick Evaluation

## 2020-01-11 NOTE — Progress Notes (Signed)
Leah Gould   Subjective:  Patient is comfortable with her epidural. No lof no vaginal bleeding +FM.   BP (!) 102/56   Pulse 67   Temp 98.1 F (36.7 C) (Axillary)   Resp 16   Ht 5\' 3"  (1.6 m)   Wt 63.5 kg   LMP 04/12/2019   SpO2 100%   BMI 24.78 kg/m  No intake/output data recorded. Total I/O In: 313.1 [I.V.:313.1] Out: -   FHT:  FHR: 120 bpm, variability: moderate,  accelerations:  Present,  decelerations:  Absent UC:   irregular, every 5-6 minutes SVE:   Dilation: 7 Effacement (%): 100 Station: 0 Exam by:: Dr. Landry Mellow AROM clear fluid   Labs: Lab Results  Component Value Date   WBC 12.2 (H) 01/11/2020   HGB 11.8 (L) 01/11/2020   HCT 36.8 01/11/2020   MCV 72.9 (L) 01/11/2020   PLT 248 01/11/2020    Assessment / Plan: Spontaneous labor, progressing normally  Labor: Progressing normally recheck cervix in 1 hour if no cervical change start pitocin 2x2 Preeclampsia:  NA Fetal Wellbeing:  Category I Pain Control:  Epidural I/D:  Penicillin Anticipated MOD:  NSVD  Dr. Nelda Marseille covering until 7pm and [facility] covering ( dr. Charlesetta Garibaldi ) after 7 pm   Christophe Louis 01/11/2020, 5:56 PM

## 2020-01-12 LAB — CBC
HCT: 30.3 % — ABNORMAL LOW (ref 36.0–46.0)
Hemoglobin: 9.7 g/dL — ABNORMAL LOW (ref 12.0–15.0)
MCH: 23.6 pg — ABNORMAL LOW (ref 26.0–34.0)
MCHC: 32 g/dL (ref 30.0–36.0)
MCV: 73.7 fL — ABNORMAL LOW (ref 80.0–100.0)
Platelets: 174 10*3/uL (ref 150–400)
RBC: 4.11 MIL/uL (ref 3.87–5.11)
RDW: 17.2 % — ABNORMAL HIGH (ref 11.5–15.5)
WBC: 11.4 10*3/uL — ABNORMAL HIGH (ref 4.0–10.5)
nRBC: 0.2 % (ref 0.0–0.2)

## 2020-01-12 LAB — RPR: RPR Ser Ql: NONREACTIVE

## 2020-01-12 MED ORDER — IBUPROFEN 600 MG PO TABS: 600.0000 mg | ORAL_TABLET | Freq: Four times a day (QID) | ORAL | 0 refills | Status: DC | PRN

## 2020-01-12 NOTE — Anesthesia Postprocedure Evaluation (Signed)
Anesthesia Post Note  Patient: Leah Gould  Procedure(s) Performed: AN AD HOC LABOR EPIDURAL     Patient location during evaluation: Mother Baby Anesthesia Type: Epidural Level of consciousness: awake and alert Pain management: pain level controlled Vital Signs Assessment: post-procedure vital signs reviewed and stable Respiratory status: spontaneous breathing, nonlabored ventilation and respiratory function stable Cardiovascular status: stable Postop Assessment: no headache, no backache and epidural receding Anesthetic complications: no   No complications documented.  Last Vitals:  Vitals:   01/12/20 0035 01/12/20 0435  BP: 120/87 104/75  Pulse: 75 (!) 101  Resp: 16 16  Temp: 36.9 C 37.1 C  SpO2: 99%     Last Pain:  Vitals:   01/12/20 0435  TempSrc: Oral  PainSc: 0-No pain   Pain Goal:                   Drucie Opitz

## 2020-01-12 NOTE — Lactation Note (Signed)
This note was copied from a baby's chart. Lactation Consultation Note Baby 56 hrs old. Mom hasn't BF. Only formula feeding. Clio asked mom if she wanted to mom stated yes. Mom didn't have her NS. Mom has small flat nipples. Unable to BF w/o #20 NS. Baby latched held in mouth no suckling. LC inserted colostrum into NS w/curve ti[ syring. Baby sucked that out then no more interest in BF. LC demonstrated pace feeding. Baby had purple nipples. Baby took about 10 ml then was sleepy not interested in feeding any more. Encouraged mom to try to give more formula before hours up and bottle expires. Mom hasn't pumped. Encouraged to do so if she wanted to give baby milk in bottle. Mom stated she wasn't even putting nipple all the way in his mouth because she was afraid he would choke. LC explained nipple has to be in his mouth for him to suck on the nipple. Hold baby up right while giving formula.  Reported to RN.  Patient Name: Boy Emmeline Winebarger [id number] Date: 01/12/2020 Reason for consult: Follow-up assessment;Primapara;Term   Maternal Data    Feeding Feeding Type: Formula Nipple Type: Extra Slow Flow  LATCH Score Latch: Repeated attempts needed to sustain latch, nipple held in mouth throughout feeding, stimulation needed to elicit sucking reflex.  Audible Swallowing: None  Type of Nipple: Flat  Comfort (Breast/Nipple): Soft / non-tender  Hold (Positioning): Full assist, staff holds infant at breast  LATCH Score: 4  Interventions Interventions: Breast feeding basics reviewed;Support pillows;Assisted with latch;Position options;Skin to skin;Breast massage;Expressed milk;Hand express;Breast compression;Adjust position  Lactation Tools Discussed/Used Nipple shield size: 20   Consult Status Consult Status: Follow-up Date: 01/13/20 Follow-up type: In-patient    Theodoro Kalata 01/12/2020, 10:54 PM

## 2020-01-12 NOTE — Progress Notes (Signed)
Post Partum Day 1 Subjective: no complaints, up ad lib, voiding, tolerating PO and + flatus  Objective: Blood pressure 104/75, pulse (!) 101, temperature 98.8 F (37.1 C), temperature source Oral, resp. rate 16, height 5\' 3"  (1.6 m), weight 63.5 kg, last menstrual period 04/12/2019, SpO2 99 %, unknown if currently breastfeeding.  Physical Exam:  General: alert, cooperative and no distress Lochia: appropriate Uterine Fundus: firm Incision: NA DVT Evaluation: No evidence of DVT seen on physical exam.  Recent Labs    01/11/20 1200 01/12/20 0519  HGB 11.8* 9.7*  HCT 36.8 30.3*    Assessment/Plan: Plan for discharge tomorrow, Breastfeeding and Lactation consult  Pt desires circumcision of newborn prior to discharge  Routine postpartum care    LOS: 1 day   Christophe Louis 01/12/2020, 8:30 AM

## 2020-01-12 NOTE — Lactation Note (Signed)
This note was copied from a baby's chart. Lactation Consultation Note Attempted to see mom. Mom sleeping. FOB holding baby in recliner.  Patient Name: Leah Gould [id number] Date: 01/12/2020     Maternal Data    Feeding    LATCH Score                   Interventions    Lactation Tools Discussed/Used     Consult Status      Theodoro Kalata 01/12/2020, 9:34 PM

## 2020-01-12 NOTE — Lactation Note (Signed)
This note was copied from a baby's chart. Lactation Consultation Note Baby 68 hrs old. Mom is BF/formula feeding. Mom had finished formula feeding baby. Baby sleeping. Encouraged mom to put baby to breast for next feeding before giving formula.  Mom has small round firm breast. Mom stated had increase in breast. Mom has small flat nipples w/ a bare areola around nipple. Nipples evert to short shaft w/stimulaiton. Hand pump given to mom to pre-pump before latching.  At this time, breast isn't compressible enough for latching. Fitted mom w/#20 NS Demonstrated application. Mom demonstrated application back several times.  Mom shown how to use DEBP & how to disassemble, clean, & reassemble parts. Mom knows to pump q3h for 15-20 min.  Mom agrees to pump to assist in everting nipples more and since using NS pumping for extra stimulation.  Hand expression taught w/colostrum flows well. Mom demonstrated hand expression collecting drops of colostrum.  Newborn behavior, STS, I&O, breast massage, milk storage, supply and demand discussed. Positioning, placement of hands, massage breast at intervals during feedings, discussed. Mom encouraged to feed baby 8-12 times/24 hours and with feeding cues.  Answered mom's questions. Encouraged to call for assistance or further questions.  Lactation brochure given   Patient Name: Boy Justyne Roell [id number] Date: 01/12/2020 Reason for consult: Initial assessment;Primapara;Term   Maternal Data Has patient been taught Hand Expression?: Yes Does the patient have breastfeeding experience prior to this delivery?: No  Feeding Feeding Type: Bottle Fed - Formula Nipple Type: Slow - flow  LATCH Score       Type of Nipple: Flat (everts w/stimulation)  Comfort (Breast/Nipple): Filling, red/small blisters or bruises, mild/mod discomfort (small breast tight)        Interventions Interventions: Breast feeding basics reviewed;Support pillows;Position  options;Skin to skin;Expressed milk;Breast massage;Hand express;Shells;Pre-pump if needed;Reverse pressure;Hand pump;DEBP;Breast compression  Lactation Tools Discussed/Used Tools: Shells;Flanges;Nipple Shields Nipple shield size: 20 Flange Size: 21 Shell Type: Inverted WIC Program: Yes Pump Review: Setup, frequency, and cleaning;Milk Storage Initiated by:: Allayne Stack RN IBCLC Date initiated:: 01/12/20   Consult Status Consult Status: Follow-up Date: 01/12/20 Follow-up type: In-patient    Theodoro Kalata 01/12/2020, 6:09 AM

## 2020-01-13 ENCOUNTER — Ambulatory Visit: Payer: Self-pay

## 2020-01-13 NOTE — Lactation Note (Signed)
This note was copied from a baby's chart. Lactation Consultation Note  Patient Name: Leah Gould [id number] Date: 01/13/2020   Per RN, patient declined an additional lactation visit before d/c.    Matthias Hughs [facility] 01/13/2020, 12:13 PM

## 2020-01-13 NOTE — Discharge Summary (Signed)
Postpartum Discharge Summary  Date of Service updated 01/13/2020     Patient Name: Leah Gould DOB: 06/08/1996 MRN: [id number]  Date of admission: 01/11/2020 Delivery date:01/11/2020  Delivering provider: Gavin Potters K  Date of discharge: 01/13/2020  Admitting diagnosis: Normal labor [O80, Z37.9] Intrauterine pregnancy: [redacted]w[redacted]d    Secondary diagnosis:  Active Problems:   Normal labor  Additional problems: None    Discharge diagnosis: Term Pregnancy Delivered                                              Post partum procedures:None Augmentation: AROM and Pitocin Complications: None  Hospital course: Onset of Labor With Vaginal Delivery      24y.o. yo G2P1011 at 440w2das admitted in Active Labor on 01/11/2020. Patient had an uncomplicated labor course as follows:  Membrane Rupture Time/Date: 5:51 PM ,01/11/2020   Delivery Method:Vaginal, Spontaneous  Episiotomy: None  Lacerations:  Labial;1st degree  Patient had an uncomplicated postpartum course.  She is ambulating, tolerating a regular diet, passing flatus, and urinating well. Patient is discharged home in stable condition on 01/13/20.  Newborn Data: Birth date:01/11/2020  Birth time:9:05 PM  Gender:Female  Living status:Living  Apgars:8 ,9  Weight:3226 g   Magnesium Sulfate received: No BMZ received: No Rhophylac:No MMR:No T-DaP:Given prenatally Flu: N/A Transfusion:No  Physical exam  Vitals:   01/12/20 0856 01/12/20 1343 01/12/20 2252 01/13/20 0508  BP: 106/79 111/74 121/80 114/66  Pulse: 76 84 76 91  Resp: '18 18 16 18  '$ Temp: 98.5 F (36.9 C) 98.6 F (37 C) 98.4 F (36.9 C) 98.4 F (36.9 C)  TempSrc: Oral Oral Oral Oral  SpO2: 99% 99% 96% 99%  Weight:      Height:       General: alert, cooperative and no distress Lochia: appropriate Uterine Fundus: firm Incision: N/A DVT Evaluation: No evidence of DVT seen on physical exam. Labs: Lab Results  Component Value Date   WBC 11.4 (H) 01/12/2020    HGB 9.7 (L) 01/12/2020   HCT 30.3 (L) 01/12/2020   MCV 73.7 (L) 01/12/2020   PLT 174 01/12/2020   CMP Latest Ref Rng & Units 11/24/2019  Glucose 70 - 99 mg/dL 94  BUN 6 - 20 mg/dL 5(L)  Creatinine 0.44 - 1.00 mg/dL 0.59  Sodium 135 - 145 mmol/L 138  Potassium 3.5 - 5.1 mmol/L 3.4(L)  Chloride 98 - 111 mmol/L 106  CO2 22 - 32 mmol/L 25  Calcium 8.9 - 10.3 mg/dL 8.6(L)  Total Protein 6.5 - 8.1 g/dL 5.7(L)  Total Bilirubin 0.3 - 1.2 mg/dL 0.3  Alkaline Phos 38 - 126 U/L 119  AST 15 - 41 U/L 28  ALT 0 - 44 U/L 18   Edinburgh Score: Edinburgh Postnatal Depression Scale Screening Tool 01/12/2020  I have been able to laugh and see the funny side of things. 0  I have looked forward with enjoyment to things. 0  I have blamed myself unnecessarily when things went wrong. 2  I have been anxious or worried for no good reason. 3  I have felt scared or panicky for no good reason. 3  Things have been getting on top of me. 0  I have been so unhappy that I have had difficulty sleeping. 0  I have felt sad or miserable. 0  I have been so unhappy  that I have been crying. 0  The thought of harming myself has occurred to me. 0  Edinburgh Postnatal Depression Scale Total 8      After visit meds:  Allergies as of 01/13/2020      Reactions   Fruit & Vegetable Daily [nutritional Supplements] Swelling   Pineapple, plum,    Peanut-containing Drug Products Swelling      Medication List    STOP taking these medications   Polysaccharide Iron Complex 100 MG/5ML Liqd     TAKE these medications   diphenhydramine-acetaminophen 25-500 MG Tabs tablet Commonly known as: TYLENOL PM Take 1 tablet by mouth at bedtime as needed (pain/sleep).   ibuprofen 600 MG tablet Commonly known as: ADVIL Take 1 tablet (600 mg total) by mouth every 6 (six) hours as needed.   PRENATAL VITAMINS PO Take 1 capsule by mouth daily.        Discharge home in stable condition Infant Feeding: Breast Infant  Disposition:home with mother Discharge instruction: per After Visit Summary and Postpartum booklet. Activity: Advance as tolerated. Pelvic rest for 6 weeks.  Diet: routine diet Anticipated Birth Control: Unsure Postpartum Appointment:6 weeks Additional Postpartum F/U: Postpartum Depression checkup Future Appointments: Future Appointments  Date Time Provider [place]  03/22/2020  1:45 PM [facility] 1 [facility] None  03/22/2020  2:20 PM Brunetta Genera, MD [facility] None   Follow up Visit:  Follow-up Information    Christophe Louis, MD. Schedule an appointment as soon as possible for a visit in 6 week(s).   Specialty: Obstetrics and Gynecology Why: please make an appointment for a postpartum visit in 6 weeks  Contact information: [address] [place] [address] [phone]                   01/13/2020 Christophe Louis, MD

## 2020-01-15 ENCOUNTER — Other Ambulatory Visit (HOSPITAL_COMMUNITY): Payer: Medicaid Other

## 2020-01-17 ENCOUNTER — Inpatient Hospital Stay (HOSPITAL_COMMUNITY): Payer: Medicaid Other

## 2020-03-22 ENCOUNTER — Other Ambulatory Visit: Payer: Self-pay

## 2020-03-22 ENCOUNTER — Inpatient Hospital Stay: Payer: Medicaid Other | Attending: Hematology

## 2020-03-22 ENCOUNTER — Inpatient Hospital Stay (HOSPITAL_BASED_OUTPATIENT_CLINIC_OR_DEPARTMENT_OTHER): Payer: Medicaid Other | Admitting: Hematology

## 2020-03-22 VITALS — BP 120/76 | HR 76 | Temp 96.9°F | Resp 18 | Ht 63.0 in | Wt 115.3 lb

## 2020-03-22 DIAGNOSIS — C8108 Nodular lymphocyte predominant Hodgkin lymphoma, lymph nodes of multiple sites: Secondary | ICD-10-CM | POA: Diagnosis not present

## 2020-03-22 DIAGNOSIS — D509 Iron deficiency anemia, unspecified: Secondary | ICD-10-CM | POA: Diagnosis not present

## 2020-03-22 DIAGNOSIS — D500 Iron deficiency anemia secondary to blood loss (chronic): Secondary | ICD-10-CM

## 2020-03-22 DIAGNOSIS — C8105 Nodular lymphocyte predominant Hodgkin lymphoma, lymph nodes of inguinal region and lower limb: Secondary | ICD-10-CM | POA: Diagnosis not present

## 2020-03-22 LAB — CMP (CANCER CENTER ONLY)
ALT: 15 U/L (ref 0–44)
AST: 18 U/L (ref 15–41)
Albumin: 3.9 g/dL (ref 3.5–5.0)
Alkaline Phosphatase: 95 U/L (ref 38–126)
Anion gap: 2 — ABNORMAL LOW (ref 5–15)
BUN: 7 mg/dL (ref 6–20)
CO2: 27 mmol/L (ref 22–32)
Calcium: 9.4 mg/dL (ref 8.9–10.3)
Chloride: 110 mmol/L (ref 98–111)
Creatinine: 0.88 mg/dL (ref 0.44–1.00)
GFR, Est AFR Am: >60 mL/min (ref >60)
GFR, Estimated: >60 mL/min (ref >60)
Glucose, Bld: 104 mg/dL — ABNORMAL HIGH (ref 70–99)
Potassium: 3.6 mmol/L (ref 3.5–5.1)
Sodium: 139 mmol/L (ref 135–145)
Total Bilirubin: 0.4 mg/dL (ref 0.3–1.2)
Total Protein: 6.8 g/dL (ref 6.5–8.1)

## 2020-03-22 LAB — CBC WITH DIFFERENTIAL/PLATELET
Abs Immature Granulocytes: 0.01 10*3/uL (ref 0.00–0.07)
Basophils Absolute: 0 10*3/uL (ref 0.0–0.1)
Basophils Relative: 1 %
Eosinophils Absolute: 0.1 10*3/uL (ref 0.0–0.5)
Eosinophils Relative: 3 %
HCT: 36.1 % (ref 36.0–46.0)
Hemoglobin: 11.7 g/dL — ABNORMAL LOW (ref 12.0–15.0)
Immature Granulocytes: 0 %
Lymphocytes Relative: 34 %
Lymphs Abs: 1.4 10*3/uL (ref 0.7–4.0)
MCH: 25.2 pg — ABNORMAL LOW (ref 26.0–34.0)
MCHC: 32.4 g/dL (ref 30.0–36.0)
MCV: 77.6 fL — ABNORMAL LOW (ref 80.0–100.0)
Monocytes Absolute: 0.2 10*3/uL (ref 0.1–1.0)
Monocytes Relative: 5 %
Neutro Abs: 2.3 10*3/uL (ref 1.7–7.7)
Neutrophils Relative %: 57 %
Platelets: 253 10*3/uL (ref 150–400)
RBC: 4.65 MIL/uL (ref 3.87–5.11)
RDW: 17.9 % — ABNORMAL HIGH (ref 11.5–15.5)
WBC: 4.1 10*3/uL (ref 4.0–10.5)
nRBC: 0 % (ref 0.0–0.2)

## 2020-03-22 LAB — IRON AND TIBC
Iron: 14 ug/dL — ABNORMAL LOW (ref 41–142)
Saturation Ratios: 4 % — ABNORMAL LOW (ref 21–57)
TIBC: 354 ug/dL (ref 236–444)
UIBC: 340 ug/dL (ref 120–384)

## 2020-03-22 LAB — FERRITIN: Ferritin: 5 ng/mL — ABNORMAL LOW (ref 11–307)

## 2020-03-22 MED ORDER — POLYSACCHARIDE IRON COMPLEX 150 MG PO CAPS
150.0000 mg | ORAL_CAPSULE | Freq: Every day | ORAL | 5 refills | Status: DC
Start: 2020-03-22 — End: 2020-07-26

## 2020-03-22 NOTE — Progress Notes (Signed)
HEMATOLOGY/ONCOLOGY CLINIC NOTE  Date of Service: 03/22/2020  Patient Care Team: [name], PA as PCP - General (Family Medicine)   CHIEF COMPLAINTS/PURPOSE OF CONSULTATION: Lymphocyte predominant hodgkins lymphoma   HISTORY OF PRESENTING ILLNESS:  Leah Gould is a wonderful 24 y.o. female who has been referred to [place] by Dr. Fanny Skates for evaluation and management of her newly diagnosed Lymphoproliferative disorder. The pt reports that she is doing well overall. She is accompanied today by her Mom, via [employer].  The pt notes that she first noticed her enlarged left inguinal lymph node in 2010, and notes that it was "really tiny." She presented to an urgent care initially at the time. She is unsure if the lymph node reduced in size.  The pt notes that she noticed her left inguinal lymph node being larger and more sensitive last summer 2019 after a dog jumped on her. She notes that after this, the lymph node continued growing in size. She notes that the lymph node began causing pain in February 2020. She notes that this lymph node would increase in size in correlation with her periods. The pt notes that she made an appointment to see Ellen Henri, [place] [facility], who referred her to surgeon Dr. Dalbert Batman.   The pt denies noticing any other new lumps or bumps and denies fevers, chills, night sweats or unexpected weight loss. The pt notes that she has had some new SOB, which she notes improves after eating ice, and has occurred more frequently recently. She endorses ice picca symptoms. She feels that she has to take deep breaths at night when she "settles down."  She does continue to feel a sense of swelling in her left groin.  The pt also endorses some discomfort in her right, lower abdomen. She was seen to have a thickened bladder and right sided ovarian cyst on the 11/17/18 CT A/P, as noted below. She denies a history of kidney stones or UTIs. She also notes that she has  felt some discomfort since her miscarriage in late April.  The pt denies using any steroids recently. The pt reports that she has had stable asthma and uses an inhaler as needed, not used frequently, endorses seasonal correlation. The pt also notes that she has been anemic since 2010, thought to be related to iron deficiency and endorses heavier periods. She notes that her periods can last up to 7-8 days which is her baseline. She has been taking PO Iron replacement for the last two months, denies other previous iron replacement. She has never had a blood transfusion.   The pt notes that she was pregnant once before and had a miscarriage about two months ago. She notes this occurred around 8-[redacted] weeks gestation, not followed by D&C.   The pt denies past surgeries and denies any medication allergies. She is not taking any chronic medications.  Of note prior to the patient's visit today, pt has had a CT A/P completed on 11/17/18 with results revealing "Enlarged presumed lymph node in the left inguinal region measuring 5.2 x 3.9 x 3.8 cm. There is enhancement of this lymph node. Etiology for this localized lymph node prominence is uncertain. Tissue sampling may well be warranted given this finding. No other adenopathy noted in the abdomen or pelvis. 2.  Spleen does not appear appreciably enlarged. 3. Wall thickening in the urinary bladder. Suspect a degree of cystitis. No renal or ureteral calculus. No hydronephrosis. 4. Apparent recent ovarian cyst  rupture on the right with mild fluid tracking from the right adnexa and cul-de-sac. 5. No evident bowel obstruction or bowel wall thickening. No abscess in the abdomen pelvis. No periappendiceal region inflammatory change."  Most recent lab results (623/20) of CBC w/diff and CMP is as follows: all values are WNL except for WBC at 3.5k, HGB at 9.4, HCT at 32.5, MCV at 65.7, MCH at 19.0, MCHC at 28.9, RDW at 21.3, ANC at 1.5k, Potassium at 3.3.  On review of systems,  pt reports some SOB, discomfort in right lower abdomen, left inguinal swelling, and denies fevers, chills, night sweats, unexpected weight loss, noticing any other lumps or bumps, mouth soreness, leg swelling, skin rashes, itching, and any other symptoms.   On PMHx the pt reports asthma and iron deficiency anemia. One pregnancy with miscarriage at 8-9 weeks. On Social Hx the pt denies smoking cigarettes and denies alcohol consumption. She works at [employer] as a Scientist, water quality. On Family Hx the pt denies blood disorders nor cancers.   INTERVAL HISTORY:  Leah Gould is here today for follow up and treatment of her NLPHL. The patient's last visit with [place] was on 11/24/2019. The pt reports that she is doing well overall.  The pt reports that she had a normal vaginal delivery and feels well. Pt discontinued taking PO Iron after the birth of her son. She is not planning to breast feed at this time. She endorses occasional night sweats that started with pregnancy and have continued.   Lab results today (03/22/20) of CBC w/diff and CMP is as follows: all values are WNL except for Hgb at 11.7, MCV at 77.6, MCH at 25.2, RDW at 17.9, Glucose at 104, Anion gap at 2. 03/22/2020 Iron Panel is as follows: Iron at 14, TIBC at 354, Sat Ratios at 4, UIBC at 340 03/22/2020 Ferritin at 5  On review of systems, pt reports abdominal soreness and denies new lumps/bumps, fevers, abdominal pain, chest pain, SOB, bowel habit changes, drenching night sweats, leg swelling and any other symptoms.    MEDICAL HISTORY:  Past Medical History:  Diagnosis Date  . Anemia   . Asthma    no meds, allergy induced  . Hodgkin's lymphoma ([place])   . Inguinal adenopathy 12/22/2018    SURGICAL HISTORY: Past Surgical History:  Procedure Laterality Date  . MASS EXCISION Left 12/22/2018   Procedure: EXCISION DEEP LEFT INGUINAL LYMPH NODE;  Surgeon: Fanny Skates, MD;  Location: [facility];  Service: General;  Laterality:  Left;    SOCIAL HISTORY: Social History   Socioeconomic History  . Marital status: Single    Spouse name: Not on file  . Number of children: Not on file  . Years of education: Not on file  . Highest education level: Not on file  Occupational History  . Not on file  Tobacco Use  . Smoking status: Never Smoker  . Smokeless tobacco: Never Used  Vaping Use  . Vaping Use: Never used  Substance and Sexual Activity  . Alcohol use: No  . Drug use: No  . Sexual activity: Yes    Birth control/protection: None  Other Topics Concern  . Not on file  Social History Narrative  . Not on file   Social Determinants of Health   Financial Resource Strain:   . Difficulty of Paying Living Expenses: Not on file  Food Insecurity:   . Worried About Charity fundraiser in the Last Year: Not on file  . Ran  Out of Food in the Last Year: Not on file  Transportation Needs:   . Lack of Transportation (Medical): Not on file  . Lack of Transportation (Non-Medical): Not on file  Physical Activity:   . Days of Exercise per Week: Not on file  . Minutes of Exercise per Session: Not on file  Stress:   . Feeling of Stress : Not on file  Social Connections:   . Frequency of Communication with Friends and Family: Not on file  . Frequency of Social Gatherings with Friends and Family: Not on file  . Attends Religious Services: Not on file  . Active Member of Clubs or Organizations: Not on file  . Attends Archivist Meetings: Not on file  . Marital Status: Not on file  Intimate Partner Violence:   . Fear of Current or Ex-Partner: Not on file  . Emotionally Abused: Not on file  . Physically Abused: Not on file  . Sexually Abused: Not on file    FAMILY HISTORY: No family history on file.  ALLERGIES:  is allergic to fruit & vegetable daily [nutritional supplements] and peanut-containing drug products.  MEDICATIONS:  Current Outpatient Medications  Medication Sig Dispense Refill  .  diphenhydramine-acetaminophen (TYLENOL PM) 25-500 MG TABS tablet Take 1 tablet by mouth at bedtime as needed (pain/sleep).    Marland Kitchen ibuprofen (ADVIL) 600 MG tablet Take 1 tablet (600 mg total) by mouth every 6 (six) hours as needed. 30 tablet 0  . iron polysaccharides (NIFEREX) 150 MG capsule Take 1 capsule (150 mg total) by mouth daily. 30 capsule 5  . Prenatal Vit-Fe Fumarate-FA (PRENATAL VITAMINS PO) Take 1 capsule by mouth daily.     No current facility-administered medications for this visit.    REVIEW OF SYSTEMS:   A 10+ POINT REVIEW OF SYSTEMS WAS OBTAINED including neurology, dermatology, psychiatry, cardiac, respiratory, lymph, extremities, GI, GU, Musculoskeletal, constitutional, breasts, reproductive, HEENT.  All pertinent positives are noted in the HPI.  All others are negative.   PHYSICAL EXAMINATION: ECOG PERFORMANCE STATUS: 0 - Asymptomatic  Vitals:   03/22/20 1420  BP: 120/76  Pulse: 76  Resp: 18  Temp: (!) 96.9 F (36.1 C)  SpO2: 100%   Filed Weights   03/22/20 1420  Weight: 115 lb 4.8 oz (52.3 kg)   Body mass index is 20.42 kg/m.   GENERAL:alert, in no acute distress and comfortable SKIN: no acute rashes, no significant lesions EYES: conjunctiva are pink and non-injected, sclera anicteric OROPHARYNX: MMM, no exudates, no oropharyngeal erythema or ulceration NECK: supple, no JVD LYMPH:  no palpable lymphadenopathy in the cervical, axillary or inguinal regions LUNGS: clear to auscultation b/l with normal respiratory effort HEART: regular rate & rhythm ABDOMEN:  normoactive bowel sounds , non tender, not distended. No palpable hepatosplenomegaly. Extremity: no pedal edema PSYCH: alert & oriented x 3 with fluent speech NEURO: no focal motor/sensory deficits  LABORATORY DATA:  I have reviewed the data as listed  CBC Latest Ref Rng & Units 03/22/2020 01/12/2020 01/11/2020  WBC 4.0 - 10.5 K/uL 4.1 11.4(H) 12.2(H)  Hemoglobin 12.0 - 15.0 g/dL 11.7(L) 9.7(L) 11.8(L)   Hematocrit 36 - 46 % 36.1 30.3(L) 36.8  Platelets 150 - 400 K/uL 253 174 248   CMP Latest Ref Rng & Units 03/22/2020 11/24/2019 09/28/2019  Glucose 70 - 99 mg/dL 104(H) 94 81  BUN 6 - 20 mg/dL 7 5(L) 7  Creatinine 0.44 - 1.00 mg/dL 0.88 0.59 0.67  Sodium 135 - 145 mmol/L 139 138 133(L)  Potassium 3.5 - 5.1 mmol/L 3.6 3.4(L) 3.8  Chloride 98 - 111 mmol/L 110 106 106  CO2 22 - 32 mmol/L 27 25 23   Calcium 8.9 - 10.3 mg/dL 9.4 8.6(L) 9.0  Total Protein 6.5 - 8.1 g/dL 6.8 5.7(L) 6.8  Total Bilirubin 0.3 - 1.2 mg/dL 0.4 0.3 0.3  Alkaline Phos 38 - 126 U/L 95 119 83  AST 15 - 41 U/L 18 28 23   ALT 0 - 44 U/L 15 18 18    . Lab Results  Component Value Date   IRON 14 (L) 03/22/2020   TIBC 354 03/22/2020   IRONPCTSAT 4 (L) 03/22/2020   (Iron and TIBC)  Lab Results  Component Value Date   FERRITIN 5 (L) 03/22/2020     01/01/2019 Lymph Node Biopsy ([id number])   12/22/18 Left Inguinal LN Biopsy:     RADIOGRAPHIC STUDIES: I have personally reviewed the radiological images as listed and agreed with the findings in the report. No results found.  ASSESSMENT & PLAN:  Leah Gould is a 23 y.o. black female with  1. 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 The sample was sent for a second opinion and is awaiting further characterization for confirmation.  11/17/18 CT A/P which revealed "Enlarged presumed lymph node in the left inguinal region measuring 5.2 x 3.9 x 3.8 cm. There is enhancement of this lymph node. Etiology for this localized lymph node prominence is uncertain. Tissue sampling may well be warranted given this finding. No other adenopathy noted in the abdomen or pelvis. 2.  Spleen does not appear appreciably enlarged. 3. Wall thickening in  the urinary bladder. Suspect a degree of cystitis. No renal or ureteral calculus. No hydronephrosis. 4. Apparent recent ovarian cyst rupture on the right with mild fluid tracking from the right adnexa and cul-de-sac. 5. No evident bowel obstruction or bowel wall thickening. No abscess in the abdomen pelvis. No periappendiceal region inflammatory change."  01/11/2019 PET scan revealed "Bilateral axillary and right subpectoral adenopathy noted with Deauville 5 disease on the left and Deauville 4 disease on the right in this region. Diffuse uniform endometrial activity with SUV of 13.2. Although this is very high for physiologic endometrial activity, the diffuse uniform distribution is more characteristic of benign physiologic endometrial activity rather than malignancy. If the patient has abnormal uterine bleeding or if otherwise clinically warranted, pelvic sonography could be utilized for further characterization of the endometrium. Postoperative findings and low-grade metabolic activity in the vicinity of the excision biopsy of the left inguinal lymph node. No other hypermetabolic adenopathy in the abdomen/pelvis identified"  01/01/2019 biopsy sampling ([id number]) from the [place] shows lymphocyte predominant Hodgkin's lymphoma.  04/27/2019 PET/CT scan ([id number]) which revealed "1. Significant response to therapy of lymphoma. Residual small bilateral axillary nodes with low-level hypermetabolism. (De[place]ville) 2. 2. 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 3. Right ovarian hypermetabolism is likely physiologic and may represent underlying corpus luteal cyst."  2. Iron deficiency anemia - hgb improved to 12.5 from 8.9 with IV iron.   PLAN: -Discussed pt labwork today, 03/22/20; all values are WNL except for Hgb at 11.7, MCV at 77.6, MCH at 25.2, RDW at 17.9, Glucose at 104, Anion gap at 2. -Discussed 03/22/2020 Ferritin at 5 - Goal Ferritin  >=50 -Discussed 03/22/2020  Iron at 14, TIBC at 354, Sat Ratios at 4, UIBC at 340 -No lab or clinical evidence of NLPHL recurrence at this time. Will continue watchful observation. -Recommend pt continue PO Iron for 6-8 months to rebuild iron stores.  -Will repeat CT C/A/P in 4 months - prior to next visit  -Refill 150 mg Iron Polysaccharide  -Will see back in 18 weeks with labs     FOLLOW UP: CT chest/abd/pelvis in 16 weeks RTC with Dr Irene Limbo with labs in 18 weeks   The total time spent in the appt was 20 minutes and more than 50% was on counseling and direct patient cares.  All of the patient's questions were answered with apparent satisfaction. The patient knows to call the clinic with any problems, questions or concerns.    Sullivan Lone MD [place] AAHIVMS [facility] Hematology/Oncology Physician [facility]  (Office):       [phone] (Work cell):  [phone] (Fax):           [phone]  03/22/2020 3:12 PM   I, Yevette Edwards, am acting as a scribe for Dr. Sullivan Lone.   .I have reviewed the above documentation for accuracy and completeness, and I agree with the above. Brunetta Genera MD

## 2020-07-18 ENCOUNTER — Telehealth: Payer: Self-pay | Admitting: Hematology

## 2020-07-18 NOTE — Telephone Encounter (Signed)
Called patient regarding upcoming appointment, left a voicemail. 

## 2020-07-21 ENCOUNTER — Other Ambulatory Visit: Payer: Self-pay

## 2020-07-21 ENCOUNTER — Inpatient Hospital Stay: Payer: Medicaid Other | Attending: Hematology

## 2020-07-21 DIAGNOSIS — Z23 Encounter for immunization: Secondary | ICD-10-CM | POA: Diagnosis present

## 2020-07-21 DIAGNOSIS — Z8571 Personal history of Hodgkin lymphoma: Secondary | ICD-10-CM | POA: Insufficient documentation

## 2020-07-21 DIAGNOSIS — C8108 Nodular lymphocyte predominant Hodgkin lymphoma, lymph nodes of multiple sites: Secondary | ICD-10-CM

## 2020-07-21 LAB — CBC WITH DIFFERENTIAL/PLATELET
Abs Immature Granulocytes: 0.01 10*3/uL (ref 0.00–0.07)
Basophils Absolute: 0 10*3/uL (ref 0.0–0.1)
Basophils Relative: 1 %
Eosinophils Absolute: 0 10*3/uL (ref 0.0–0.5)
Eosinophils Relative: 1 %
HCT: 33.9 % — ABNORMAL LOW (ref 36.0–46.0)
Hemoglobin: 10.4 g/dL — ABNORMAL LOW (ref 12.0–15.0)
Immature Granulocytes: 0 %
Lymphocytes Relative: 37 %
Lymphs Abs: 1.7 10*3/uL (ref 0.7–4.0)
MCH: 22.1 pg — ABNORMAL LOW (ref 26.0–34.0)
MCHC: 30.7 g/dL (ref 30.0–36.0)
MCV: 72.1 fL — ABNORMAL LOW (ref 80.0–100.0)
Monocytes Absolute: 0.4 10*3/uL (ref 0.1–1.0)
Monocytes Relative: 8 %
Neutro Abs: 2.5 10*3/uL (ref 1.7–7.7)
Neutrophils Relative %: 53 %
Platelets: 254 10*3/uL (ref 150–400)
RBC: 4.7 MIL/uL (ref 3.87–5.11)
RDW: 17.7 % — ABNORMAL HIGH (ref 11.5–15.5)
WBC: 4.7 10*3/uL (ref 4.0–10.5)
nRBC: 0 % (ref 0.0–0.2)

## 2020-07-21 LAB — CMP (CANCER CENTER ONLY)
ALT: 11 U/L (ref 0–44)
AST: 17 U/L (ref 15–41)
Albumin: 4.5 g/dL (ref 3.5–5.0)
Alkaline Phosphatase: 67 U/L (ref 38–126)
Anion gap: 11 (ref 5–15)
BUN: 11 mg/dL (ref 6–20)
CO2: 22 mmol/L (ref 22–32)
Calcium: 9.4 mg/dL (ref 8.9–10.3)
Chloride: 108 mmol/L (ref 98–111)
Creatinine: 0.81 mg/dL (ref 0.44–1.00)
GFR, Estimated: >60 mL/min (ref >60)
Glucose, Bld: 90 mg/dL (ref 70–99)
Potassium: 3.6 mmol/L (ref 3.5–5.1)
Sodium: 141 mmol/L (ref 135–145)
Total Bilirubin: 0.5 mg/dL (ref 0.3–1.2)
Total Protein: 7.4 g/dL (ref 6.5–8.1)

## 2020-07-21 LAB — LACTATE DEHYDROGENASE: LDH: 137 U/L (ref 98–192)

## 2020-07-25 ENCOUNTER — Ambulatory Visit (HOSPITAL_COMMUNITY)
Admission: RE | Admit: 2020-07-25 | Discharge: 2020-07-25 | Disposition: A | Discharge status: Home / Self Care | Payer: Medicaid Other | Source: Ambulatory Visit | Attending: Hematology | Admitting: Hematology

## 2020-07-25 ENCOUNTER — Encounter (HOSPITAL_COMMUNITY): Payer: Self-pay

## 2020-07-25 ENCOUNTER — Other Ambulatory Visit: Payer: Self-pay

## 2020-07-25 DIAGNOSIS — C8108 Nodular lymphocyte predominant Hodgkin lymphoma, lymph nodes of multiple sites: Secondary | ICD-10-CM | POA: Diagnosis not present

## 2020-07-25 MED ORDER — IOHEXOL 300 MG/ML  SOLN
100.0000 mL | Freq: Once | INTRAMUSCULAR | Status: AC | PRN
  Administered 2020-07-25: 100 mL via INTRAVENOUS

## 2020-07-25 NOTE — Progress Notes (Signed)
HEMATOLOGY/ONCOLOGY CLINIC NOTE  Date of Service: 07/25/2020  Patient Care Team: Wilfrid LundBecker, Anna G, PA as PCP - General (Family Medicine)   CHIEF COMPLAINTS/PURPOSE OF CONSULTATION: Lymphocyte predominant hodgkins lymphoma   HISTORY OF PRESENTING ILLNESS:  Leah Gould is a wonderful 25 y.o. female who has been referred to us by Dr. Claud KelpHaywood Ingram for evaluation and management of her newly diagnosed Lymphoproliferative disorder. The pt reports that she is doing well overall. She is accompanied today by her Mom, via [employer].  The pt notes that she first noticed her enlarged left inguinal lymph node in 2010, and notes that it was "really tiny." She presented to an urgent care initially at the time. She is unsure if the lymph node reduced in size.  The pt notes that she noticed her left inguinal lymph node being larger and more sensitive last summer 2019 after a dog jumped on her. She notes that after this, the lymph node continued growing in size. She notes that the lymph node began causing pain in February 2020. She notes that this lymph node would increase in size in correlation with her periods. The pt notes that she made an appointment to see Wilhemena [facility]ieAnn Becker, [facility], who referred her to surgeon Dr. Derrell LollingIngram.   The pt denies noticing any other new lumps or bumps and denies fevers, chills, night sweats or unexpected weight loss. The pt notes that she has had some new SOB, which she notes improves after eating ice, and has occurred more frequently recently. She endorses ice picca symptoms. She feels that she has to take deep breaths at night when she "settles down."  She does continue to feel a sense of swelling in her left groin.  The pt also endorses some discomfort in her right, lower abdomen. She was seen to have a thickened bladder and right sided ovarian cyst on the 11/17/18 CT A/P, as noted below. She denies a history of kidney stones or UTIs. She also notes that she has  felt some discomfort since her miscarriage in late April.  The pt denies using any steroids recently. The pt reports that she has had stable asthma and uses an inhaler as needed, not used frequently, endorses seasonal correlation. The pt also notes that she has been anemic since 2010, thought to be related to iron deficiency and endorses heavier periods. She notes that her periods can last up to 7-8 days which is her baseline. She has been taking PO Iron replacement for the last two months, denies other previous iron replacement. She has never had a blood transfusion.   The pt notes that she was pregnant once before and had a miscarriage about two months ago. She notes this occurred around 8-[redacted] weeks gestation, not followed by D&C.   The pt denies past surgeries and denies any medication allergies. She is not taking any chronic medications.  Of note prior to the patient's visit today, pt has had a CT A/P completed on 11/17/18 with results revealing "Enlarged presumed lymph node in the left inguinal region measuring 5.2 x 3.9 x 3.8 cm. There is enhancement of this lymph node. Etiology for this localized lymph node prominence is uncertain. Tissue sampling may well be warranted given this finding. No other adenopathy noted in the abdomen or pelvis. 2.  Spleen does not appear appreciably enlarged. 3. Wall thickening in the urinary bladder. Suspect a degree of cystitis. No renal or ureteral calculus. No hydronephrosis. 4. Apparent recent ovarian cyst  rupture on the right with mild fluid tracking from the right adnexa and cul-de-sac. 5. No evident bowel obstruction or bowel wall thickening. No abscess in the abdomen pelvis. No periappendiceal region inflammatory change."  Most recent lab results (623/20) of CBC w/diff and CMP is as follows: all values are WNL except for WBC at 3.5k, HGB at 9.4, HCT at 32.5, MCV at 65.7, MCH at 19.0, MCHC at 28.9, RDW at 21.3, ANC at 1.5k, Potassium at 3.3.  On review of systems,  pt reports some SOB, discomfort in right lower abdomen, left inguinal swelling, and denies fevers, chills, night sweats, unexpected weight loss, noticing any other lumps or bumps, mouth soreness, leg swelling, skin rashes, itching, and any other symptoms.   On PMHx the pt reports asthma and iron deficiency anemia. One pregnancy with miscarriage at 8-9 weeks. On Social Hx the pt denies smoking cigarettes and denies alcohol consumption. She works at [employer] as a Scientist, water quality. On Family Hx the pt denies blood disorders nor cancers.   INTERVAL HISTORY:  Leah Gould is here today for follow up and treatment of her NLPHL. The patient's last visit with [place] was on 03/22/2020. The pt reports that she is doing well overall.  The pt reports no new concerns or symptoms. It has been 6 months post-partum. The pt notes she still gets sharp pains at the location of incision of lymph node. This pain is intermittent and is not completely bothersome. The pt reports she is now having her menstrual cycles again for the last two months, as they are not heavy and last 4 days.   The pt notes she is still taking Iron Polysaccharide once daily. She is tolerating them well. The pt intermittently craves ice. She last craved ice last week.   The pt notes that she has a decreased appetite and has lost weight recently.   The pt notes that she has not received any of her COVID vaccines or booster.   Of note since the patient's last visit, pt has had CT Chest/Abd/Pel w contrast ([id number]) on 07/25/2020, which revealed "1. No persistent lymphadenopathy in the chest, abdomen, or pelvis. Findings are consistent with sustained treatment response. 2. Redemonstrated postoperative findings of left inguinal lymph node dissection. 3. Spleen is normal in size."  Lab results 07/21/2020 of CBC w/diff and CMP is as follows: all values are WNL except for Hgb of 10.4, HCT of 33.9, MCV of 72.1, MCH of 22.1, RDW of 17.7. 07/21/2020 LDH of  137.  On review of systems, pt reports decreased appetite and denies new lumps/bumps, fevers/chills/night sweats, fatigue, constipation, abdominal pain, leg swelling and any other symptoms.   MEDICAL HISTORY:  Past Medical History:  Diagnosis Date  . Anemia   . Asthma    no meds, allergy induced  . Hodgkin's lymphoma ([place])   . Inguinal adenopathy 12/22/2018    SURGICAL HISTORY: Past Surgical History:  Procedure Laterality Date  . MASS EXCISION Left 12/22/2018   Procedure: EXCISION DEEP LEFT INGUINAL LYMPH NODE;  Surgeon: Fanny Skates, MD;  Location: [facility];  Service: General;  Laterality: Left;    SOCIAL HISTORY: Social History   Socioeconomic History  . Marital status: Single    Spouse name: Not on file  . Number of children: Not on file  . Years of education: Not on file  . Highest education level: Not on file  Occupational History  . Not on file  Tobacco Use  . Smoking status: Never Smoker  .  Smokeless tobacco: Never Used  Vaping Use  . Vaping Use: Never used  Substance and Sexual Activity  . Alcohol use: No  . Drug use: No  . Sexual activity: Yes    Birth control/protection: None  Other Topics Concern  . Not on file  Social History Narrative  . Not on file   Social Determinants of Health   Financial Resource Strain: Not on file  Food Insecurity: Not on file  Transportation Needs: Not on file  Physical Activity: Not on file  Stress: Not on file  Social Connections: Not on file  Intimate Partner Violence: Not on file    FAMILY HISTORY: No family history on file.  ALLERGIES:  is allergic to fruit & vegetable daily [nutritional supplements] and peanut-containing drug products.  MEDICATIONS:  Current Outpatient Medications  Medication Sig Dispense Refill  . diphenhydramine-acetaminophen (TYLENOL PM) 25-500 MG TABS tablet Take 1 tablet by mouth at bedtime as needed (pain/sleep).    Marland Kitchen ibuprofen (ADVIL) 600 MG tablet Take 1 tablet  (600 mg total) by mouth every 6 (six) hours as needed. 30 tablet 0  . iron polysaccharides (NIFEREX) 150 MG capsule Take 1 capsule (150 mg total) by mouth daily. 30 capsule 5  . Prenatal Vit-Fe Fumarate-FA (PRENATAL VITAMINS PO) Take 1 capsule by mouth daily.     No current facility-administered medications for this visit.    REVIEW OF SYSTEMS:   10 Point review of Systems was done is negative except as noted above.  PHYSICAL EXAMINATION: ECOG PERFORMANCE STATUS: 0 - Asymptomatic  Vitals:   07/26/20 0903  BP: 124/73  Pulse: 89  Resp: 18  Temp: 98.7 F (37.1 C)  SpO2: 100%   Filed Weights   07/26/20 0903  Weight: 103 lb 11.2 oz (47 kg)   Body mass index is 18.37 kg/m.    GENERAL:alert, in no acute distress and comfortable SKIN: no acute rashes, no significant lesions EYES: conjunctiva are pink and non-injected, sclera anicteric OROPHARYNX: MMM, no exudates, no oropharyngeal erythema or ulceration NECK: supple, no JVD LYMPH:  no palpable lymphadenopathy in the cervical, axillary or inguinal regions LUNGS: clear to auscultation b/l with normal respiratory effort HEART: regular rate & rhythm ABDOMEN:  normoactive bowel sounds , non tender, not distended. Extremity: no pedal edema PSYCH: alert & oriented x 3 with fluent speech NEURO: no focal motor/sensory deficits    LABORATORY DATA:  I have reviewed the data as listed  CBC Latest Ref Rng & Units 07/21/2020 03/22/2020 01/12/2020  WBC 4.0 - 10.5 K/uL 4.7 4.1 11.4(H)  Hemoglobin 12.0 - 15.0 g/dL 10.4(L) 11.7(L) 9.7(L)  Hematocrit 36.0 - 46.0 % 33.9(L) 36.1 30.3(L)  Platelets 150 - 400 K/uL 254 253 174   CMP Latest Ref Rng & Units 07/21/2020 03/22/2020 11/24/2019  Glucose 70 - 99 mg/dL 90 104(H) 94  BUN 6 - 20 mg/dL 11 7 5(L)  Creatinine 0.44 - 1.00 mg/dL 0.81 0.88 0.59  Sodium 135 - 145 mmol/L 141 139 138  Potassium 3.5 - 5.1 mmol/L 3.6 3.6 3.4(L)  Chloride 98 - 111 mmol/L 108 110 106  CO2 22 - 32 mmol/L 22 27 25    Calcium 8.9 - 10.3 mg/dL 9.4 9.4 8.6(L)  Total Protein 6.5 - 8.1 g/dL 7.4 6.8 5.7(L)  Total Bilirubin 0.3 - 1.2 mg/dL 0.5 0.4 0.3  Alkaline Phos 38 - 126 U/L 67 95 119  AST 15 - 41 U/L 17 18 28   ALT 0 - 44 U/L 11 15 18    . Lab  Results  Component Value Date   IRON 14 (L) 03/22/2020   TIBC 354 03/22/2020   IRONPCTSAT 4 (L) 03/22/2020   (Iron and TIBC)  Lab Results  Component Value Date   FERRITIN 5 (L) 03/22/2020     01/01/2019 Lymph Node Biopsy [id number])   12/22/18 Left Inguinal LN Biopsy:     RADIOGRAPHIC STUDIES: I have personally reviewed the radiological images as listed and agreed with the findings in the report. CT CHEST ABDOMEN PELVIS W CONTRAST  Result Date: 07/25/2020 CLINICAL DATA:  Hodgkin's lymphoma, restaging EXAM: CT CHEST, ABDOMEN, AND PELVIS WITH CONTRAST TECHNIQUE: Multidetector CT imaging of the chest, abdomen and pelvis was performed following the standard protocol during bolus administration of intravenous contrast. CONTRAST:  131mL OMNIPAQUE IOHEXOL 300 MG/ML SOLN, additional oral enteric contrast COMPARISON:  PET-CT, 04/27/2019 FINDINGS: CT CHEST FINDINGS Cardiovascular: No significant vascular findings. Normal heart size. No pericardial effusion. Mediastinum/Nodes: No enlarged mediastinal, hilar, or axillary lymph nodes. No persistent enlargement of bilateral axillary and subpectoral lymph nodes. Thymic remnant in the anterior mediastinum. Thyroid gland, trachea, and esophagus demonstrate no significant findings. Lungs/Pleura: Lungs are clear. No pleural effusion or pneumothorax. Musculoskeletal: No chest wall mass or suspicious bone lesions identified. CT ABDOMEN PELVIS FINDINGS Hepatobiliary: No solid liver abnormality is seen. Small incidental flash filling hemangioma of the anterior right lobe of the liver (series 2, image 53). No gallstones, gallbladder wall thickening, or biliary dilatation. Pancreas: Unremarkable. No pancreatic ductal dilatation or  surrounding inflammatory changes. Spleen: Normal in size without significant abnormality. Adrenals/Urinary Tract: Adrenal glands are unremarkable. Kidneys are normal, without renal calculi, solid lesion, or hydronephrosis. Bladder is unremarkable. Stomach/Bowel: Stomach is within normal limits. Appendix appears normal. No evidence of bowel wall thickening, distention, or inflammatory changes. Vascular/Lymphatic: No significant vascular findings are present. No enlarged abdominal or pelvic lymph nodes. Redemonstrated postoperative findings of left inguinal lymph node dissection (series 2, image 111). Reproductive: No mass or other abnormality. Other: No abdominal wall hernia or abnormality. No abdominopelvic ascites. Musculoskeletal: No acute or significant osseous findings. IMPRESSION: 1. No persistent lymphadenopathy in the chest, abdomen, or pelvis. Findings are consistent with sustained treatment response. 2. Redemonstrated postoperative findings of left inguinal lymph node dissection. 3. Spleen is normal in size. Electronically Signed   By: Eddie Candle M.D.   On: 07/25/2020 08:14    ASSESSMENT & PLAN:  Starla Mall. Buist is a 25 y.o. Black female with  1. 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 The sample was sent for a second opinion and is awaiting further characterization for confirmation.  11/17/18 CT A/P which revealed "Enlarged presumed lymph node in the left inguinal region measuring 5.2 x 3.9 x 3.8 cm. There is enhancement of this lymph node. Etiology for this localized lymph node prominence is uncertain. Tissue sampling may well be warranted given this finding. No other adenopathy noted in the abdomen or pelvis. 2.  Spleen does not appear appreciably enlarged. 3.  Wall thickening in the urinary bladder. Suspect a degree of cystitis. No renal or ureteral calculus. No hydronephrosis. 4. Apparent recent ovarian cyst rupture on the right with mild fluid tracking from the right adnexa and cul-de-sac. 5. No evident bowel obstruction or bowel wall thickening. No abscess in the abdomen pelvis. No periappendiceal region inflammatory change."  01/11/2019 PET scan  revealed "Bilateral axillary and right subpectoral adenopathy noted with Deauville 5 disease on the left and Deauville 4 disease on the right in this region. Diffuse uniform endometrial activity with SUV of 13.2. Although this is very high for physiologic endometrial activity, the diffuse uniform distribution is more characteristic of benign physiologic endometrial activity rather than malignancy. If the patient has abnormal uterine bleeding or if otherwise clinically warranted, pelvic sonography could be utilized for further characterization of the endometrium. Postoperative findings and low-grade metabolic activity in the vicinity of the excision biopsy of the left inguinal lymph node. No other hypermetabolic adenopathy in the abdomen/pelvis identified"  01/01/2019 biopsy sampling ([id number]) from the [facility] shows lymphocyte predominant Hodgkin's lymphoma.  04/27/2019 PET/CT scan ([id number]) which revealed "1. Significant response to therapy of lymphoma. Residual small bilateral axillary nodes with low-level hypermetabolism. ([place]) 2. 2. 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 3. Right ovarian hypermetabolism is likely physiologic and may represent underlying corpus luteal cyst."  2. Iron deficiency anemia - hgb improved to 12.5 from 8.9 with IV iron.   PLAN: -Discussed pt labwork today, all chemistries look normal/stable, LDH normal, slightly anemic due to iron deficiency. -Discussed pt CT Chest/Abd/Pel w contrast ([id number]) on 07/25/2020; no  signs of recurrent lymphoma -Advise pt to take Iron Polysaccharide pills BID. The pt is agreeable of this switch from daily to BID. -Will get Ferritin and Iron and TIBC labs today.- reviewed -Recommend pt take Vitamin B-Complex and Vitamin B12 daily.  -Recommend pt receive COVID vaccine. The pt is agreeable and will get COVID shot today. -No lab or clinical evidence of NLPHL recurrence at this time. Will continue watchful observation. -Will see back in 4 months with labs.    FOLLOW UP: Covid pfizer vaccine dose 1 today COvid pfizer vaccine Dose 2 in 3 weeks RTC with Dr Irene Limbo with labs in 4 months    The total time spent in the appointment was 30 minutes and more than 50% was on counseling and direct patient cares.  All of the patient's questions were answered with apparent satisfaction. The patient knows to call the clinic with any problems, questions or concerns.    Sullivan Lone MD [place] AAHIVMS [facility] Hematology/Oncology Physician [facility]  (Office):       [phone] (Work cell):  [phone] (Fax):           [phone]  07/25/2020 1:03 PM   I, Reinaldo Raddle, am acting as scribe for Dr. Sullivan Lone, MD.    .I have reviewed the above documentation for accuracy and completeness, and I agree with the above. Brunetta Genera MD

## 2020-07-26 ENCOUNTER — Inpatient Hospital Stay: Payer: Medicaid Other

## 2020-07-26 ENCOUNTER — Other Ambulatory Visit: Payer: Self-pay

## 2020-07-26 ENCOUNTER — Inpatient Hospital Stay (HOSPITAL_BASED_OUTPATIENT_CLINIC_OR_DEPARTMENT_OTHER): Payer: Medicaid Other | Admitting: Hematology

## 2020-07-26 ENCOUNTER — Other Ambulatory Visit: Payer: Medicaid Other

## 2020-07-26 VITALS — BP 124/73 | HR 89 | Temp 98.7°F | Resp 18 | Ht 63.0 in | Wt 103.7 lb

## 2020-07-26 DIAGNOSIS — Z8571 Personal history of Hodgkin lymphoma: Secondary | ICD-10-CM | POA: Diagnosis not present

## 2020-07-26 DIAGNOSIS — C8108 Nodular lymphocyte predominant Hodgkin lymphoma, lymph nodes of multiple sites: Secondary | ICD-10-CM

## 2020-07-26 DIAGNOSIS — D500 Iron deficiency anemia secondary to blood loss (chronic): Secondary | ICD-10-CM

## 2020-07-26 DIAGNOSIS — Z23 Encounter for immunization: Secondary | ICD-10-CM

## 2020-07-26 MED ORDER — POLYSACCHARIDE IRON COMPLEX 150 MG PO CAPS: 150.0000 mg | ORAL_CAPSULE | Freq: Two times a day (BID) | ORAL | 5 refills | Status: DC

## 2020-07-26 MED ORDER — B COMPLEX VITAMINS PO CAPS
1.0000 | ORAL_CAPSULE | Freq: Every day | ORAL | 5 refills | Status: DC
Start: 2020-07-26 — End: 2020-12-20

## 2020-07-26 NOTE — Progress Notes (Signed)
   Covid-19 Vaccination Clinic  Name:  Lyra Alaimo. Branden    MRN: [id number] DOB: Sep 01, 1995  07/26/2020  Ms. Mitter was observed post Covid-19 immunization for 15 minutes without incident. She was provided with Vaccine Information Sheet and instruction to access the V-Safe system.   Ms. Braniff was instructed to call 911 with any severe reactions post vaccine: Marland Kitchen Difficulty breathing  . Swelling of face and throat  . A fast heartbeat  . A bad rash all over body  . Dizziness and weakness   Immunizations Administered    Name Date Dose VIS Date Route   Pfizer COVID-19 Vaccine 07/26/2020 11:05 AM 0.3 mL 04/19/2020 Intramuscular   Manufacturer: [place]   Lot: [id number]   NDC: [address]-1

## 2020-08-16 ENCOUNTER — Inpatient Hospital Stay: Payer: Medicaid Other

## 2020-08-16 ENCOUNTER — Other Ambulatory Visit: Payer: Self-pay

## 2020-08-18 ENCOUNTER — Inpatient Hospital Stay: Payer: Medicaid Other | Attending: Hematology

## 2020-08-18 ENCOUNTER — Other Ambulatory Visit: Payer: Self-pay

## 2020-08-18 DIAGNOSIS — Z23 Encounter for immunization: Secondary | ICD-10-CM | POA: Diagnosis not present

## 2020-11-21 NOTE — Progress Notes (Signed)
HEMATOLOGY/ONCOLOGY CLINIC NOTE  Date of Service: 11/22/2020  Patient Care Team: [name], PA as PCP - General (Family Medicine)   CHIEF COMPLAINTS/PURPOSE OF CONSULTATION: Lymphocyte predominant hodgkins lymphoma   HISTORY OF PRESENTING ILLNESS:  Leah Gould is a wonderful 25 y.o. female who has been referred to [place] by Dr. Fanny Skates for evaluation and management of her newly diagnosed Lymphoproliferative disorder. The pt reports that she is doing well overall. She is accompanied today by her Mom, via [employer].  The pt notes that she first noticed her enlarged left inguinal lymph node in 2010, and notes that it was "really tiny." She presented to an urgent care initially at the time. She is unsure if the lymph node reduced in size.  The pt notes that she noticed her left inguinal lymph node being larger and more sensitive last summer 2019 after a dog jumped on her. She notes that after this, the lymph node continued growing in size. She notes that the lymph node began causing pain in February 2020. She notes that this lymph node would increase in size in correlation with her periods. The pt notes that she made an appointment to see Ellen Henri, [place] [facility], who referred her to surgeon Dr. Dalbert Batman.   The pt denies noticing any other new lumps or bumps and denies fevers, chills, night sweats or unexpected weight loss. The pt notes that she has had some new SOB, which she notes improves after eating ice, and has occurred more frequently recently. She endorses ice picca symptoms. She feels that she has to take deep breaths at night when she "settles down."  She does continue to feel a sense of swelling in her left groin.  The pt also endorses some discomfort in her right, lower abdomen. She was seen to have a thickened bladder and right sided ovarian cyst on the 11/17/18 CT A/P, as noted below. She denies a history of kidney stones or UTIs. She also notes that she has  felt some discomfort since her miscarriage in late April.  The pt denies using any steroids recently. The pt reports that she has had stable asthma and uses an inhaler as needed, not used frequently, endorses seasonal correlation. The pt also notes that she has been anemic since 2010, thought to be related to iron deficiency and endorses heavier periods. She notes that her periods can last up to 7-8 days which is her baseline. She has been taking PO Iron replacement for the last two months, denies other previous iron replacement. She has never had a blood transfusion.   The pt notes that she was pregnant once before and had a miscarriage about two months ago. She notes this occurred around 8-[redacted] weeks gestation, not followed by D&C.   The pt denies past surgeries and denies any medication allergies. She is not taking any chronic medications.  Of note prior to the patient's visit today, pt has had a CT A/P completed on 11/17/18 with results revealing "Enlarged presumed lymph node in the left inguinal region measuring 5.2 x 3.9 x 3.8 cm. There is enhancement of this lymph node. Etiology for this localized lymph node prominence is uncertain. Tissue sampling may well be warranted given this finding. No other adenopathy noted in the abdomen or pelvis. 2.  Spleen does not appear appreciably enlarged. 3. Wall thickening in the urinary bladder. Suspect a degree of cystitis. No renal or ureteral calculus. No hydronephrosis. 4. Apparent recent ovarian cyst  rupture on the right with mild fluid tracking from the right adnexa and cul-de-sac. 5. No evident bowel obstruction or bowel wall thickening. No abscess in the abdomen pelvis. No periappendiceal region inflammatory change."  Most recent lab results (623/20) of CBC w/diff and CMP is as follows: all values are WNL except for WBC at 3.5k, HGB at 9.4, HCT at 32.5, MCV at 65.7, MCH at 19.0, MCHC at 28.9, RDW at 21.3, ANC at 1.5k, Potassium at 3.3.  On review of systems,  pt reports some SOB, discomfort in right lower abdomen, left inguinal swelling, and denies fevers, chills, night sweats, unexpected weight loss, noticing any other lumps or bumps, mouth soreness, leg swelling, skin rashes, itching, and any other symptoms.   On PMHx the pt reports asthma and iron deficiency anemia. One pregnancy with miscarriage at 8-9 weeks. On Social Hx the pt denies smoking cigarettes and denies alcohol consumption. She works at [employer] as a Scientist, water quality. On Family Hx the pt denies blood disorders nor cancers.   INTERVAL HISTORY:  Leah Gould is here today for follow up and treatment of her NLPHL. The patient's last visit with [place] was on 07/26/2020. The pt reports that she is doing well overall.  The pt reports that she is now ten months postpartum. She notes no new symptoms or concerns. She has since started having her periods again. She has been on Zafemy patch for two months an this is controlling her periods well. The pt notes heavy bleeding for months after birth and her periods were heavy prior to the patch. She did not breast feed. This patch is progestin and estrogen.   The pt notes she has not taken iron supplementation for one month. She was on these pills once daily prior. The pt has recently started craving ice and becoming more fatigued.  The pt notes that they noted some abnormal cells on her pap smear she had.   Lab results today 11/22/2020 of CBC w/diff and CMP is as follows: all values are WNL except for Hgb of 8.8, HCT of 29.1, MCV of 66.0, MCH of 20.0, RDW of 17.7. 11/22/2020 Iron 2% 11/22/2020 Ferritin < 4 11/22/2020 LDH of 135.  On review of systems, pt reports fatigue, ice cravings, decreased sleep and denies new lumps/bumps, fevers, chills, night sweats, decreased appetite, skipped meals, abdominal pain, and any other symptoms.   MEDICAL HISTORY:  Past Medical History:  Diagnosis Date  . Anemia   . Asthma    no meds, allergy induced  . Hodgkin's  lymphoma ([place])   . Inguinal adenopathy 12/22/2018    SURGICAL HISTORY: Past Surgical History:  Procedure Laterality Date  . MASS EXCISION Left 12/22/2018   Procedure: EXCISION DEEP LEFT INGUINAL LYMPH NODE;  Surgeon: Fanny Skates, MD;  Location: [place];  Service: General;  Laterality: Left;    SOCIAL HISTORY: Social History   Socioeconomic History  . Marital status: Single    Spouse name: Not on file  . Number of children: Not on file  . Years of education: Not on file  . Highest education level: Not on file  Occupational History  . Not on file  Tobacco Use  . Smoking status: Never Smoker  . Smokeless tobacco: Never Used  Vaping Use  . Vaping Use: Never used  Substance and Sexual Activity  . Alcohol use: No  . Drug use: No  . Sexual activity: Yes    Birth control/protection: None  Other Topics Concern  . Not  on file  Social History Narrative  . Not on file   Social Determinants of Health   Financial Resource Strain: Not on file  Food Insecurity: Not on file  Transportation Needs: Not on file  Physical Activity: Not on file  Stress: Not on file  Social Connections: Not on file  Intimate Partner Violence: Not on file    FAMILY HISTORY: No family history on file.  ALLERGIES:  is allergic to fruit & vegetable daily [nutritional supplements] and peanut-containing drug products.  MEDICATIONS:  Current Outpatient Medications  Medication Sig Dispense Refill  . b complex vitamins capsule Take 1 capsule by mouth daily. 30 capsule 5  . diphenhydramine-acetaminophen (TYLENOL PM) 25-500 MG TABS tablet Take 1 tablet by mouth at bedtime as needed (pain/sleep).    Marland Kitchen ibuprofen (ADVIL) 600 MG tablet Take 1 tablet (600 mg total) by mouth every 6 (six) hours as needed. 30 tablet 0  . iron polysaccharides (NIFEREX) 150 MG capsule Take 1 capsule (150 mg total) by mouth daily. 60 capsule 6   No current facility-administered medications for this visit.     REVIEW OF SYSTEMS:   10 Point review of Systems was done is negative except as noted above.  PHYSICAL EXAMINATION: ECOG PERFORMANCE STATUS: 0 - Asymptomatic  Vitals:   11/22/20 1405  BP: (!) 104/58  Pulse: 82  Resp: 17  Temp: 98.4 F (36.9 C)  SpO2: 100%   Filed Weights   11/22/20 1405  Weight: 99 lb 11.2 oz (45.2 kg)   Body mass index is 17.66 kg/m.    GENERAL:alert, in no acute distress and comfortable SKIN: no acute rashes, no significant lesions EYES: conjunctiva are pink and non-injected, sclera anicteric OROPHARYNX: MMM, no exudates, no oropharyngeal erythema or ulceration NECK: supple, no JVD LYMPH:  no palpable lymphadenopathy in the cervical, axillary or inguinal regions LUNGS: clear to auscultation b/l with normal respiratory effort HEART: regular rate & rhythm ABDOMEN:  normoactive bowel sounds , non tender, not distended. Extremity: no pedal edema PSYCH: alert & oriented x 3 with fluent speech NEURO: no focal motor/sensory deficits    LABORATORY DATA:  I have reviewed the data as listed  CBC Latest Ref Rng & Units 11/22/2020 07/21/2020 03/22/2020  WBC 4.0 - 10.5 K/uL 4.4 4.7 4.1  Hemoglobin 12.0 - 15.0 g/dL 8.8(L) 10.4(L) 11.7(L)  Hematocrit 36.0 - 46.0 % 29.1(L) 33.9(L) 36.1  Platelets 150 - 400 K/uL 202 254 253   CMP Latest Ref Rng & Units 11/22/2020 07/21/2020 03/22/2020  Glucose 70 - 99 mg/dL 85 90 104(H)  BUN 6 - 20 mg/dL 8 11 7   Creatinine 0.44 - 1.00 mg/dL 0.76 0.81 0.88  Sodium 135 - 145 mmol/L 141 141 139  Potassium 3.5 - 5.1 mmol/L 3.7 3.6 3.6  Chloride 98 - 111 mmol/L 105 108 110  CO2 22 - 32 mmol/L 25 22 27   Calcium 8.9 - 10.3 mg/dL 9.4 9.4 9.4  Total Protein 6.5 - 8.1 g/dL 7.0 7.4 6.8  Total Bilirubin 0.3 - 1.2 mg/dL 0.3 0.5 0.4  Alkaline Phos 38 - 126 U/L 64 67 95  AST 15 - 41 U/L 17 17 18   ALT 0 - 44 U/L 11 11 15    . Lab Results  Component Value Date   IRON 10 (L) 11/22/2020   TIBC 526 (H) 11/22/2020   IRONPCTSAT 2 (L)  11/22/2020   (Iron and TIBC)  Lab Results  Component Value Date   FERRITIN <4 (L) 11/22/2020     01/01/2019  Lymph Node Biopsy ([id number])   12/22/18 Left Inguinal LN Biopsy:     RADIOGRAPHIC STUDIES: I have personally reviewed the radiological images as listed and agreed with the findings in the report. No results found.  ASSESSMENT & PLAN:  Jakalyn Kratky. Langhorst is a 25 y.o. Black female with  1. 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 The sample was sent for a second opinion and is awaiting further characterization for confirmation.  11/17/18 CT A/P which revealed "Enlarged presumed lymph node in the left inguinal region measuring 5.2 x 3.9 x 3.8 cm. There is enhancement of this lymph node. Etiology for this localized lymph node prominence is uncertain. Tissue sampling may well be warranted given this finding. No other adenopathy noted in the abdomen or pelvis. 2.  Spleen does not appear appreciably enlarged. 3. Wall thickening in the urinary bladder. Suspect a degree of cystitis. No renal or ureteral calculus. No hydronephrosis. 4. Apparent recent ovarian cyst rupture on the right with mild fluid tracking from the right adnexa and cul-de-sac. 5. No evident bowel obstruction or bowel wall thickening. No abscess in the abdomen pelvis. No periappendiceal region inflammatory change."  01/11/2019 PET scan revealed "Bilateral axillary and right subpectoral adenopathy noted with Deauville 5 disease on the left and Deauville 4 disease on the right in this region. Diffuse uniform endometrial activity with SUV of 13.2. Although this is very high for physiologic endometrial activity, the diffuse uniform distribution is more characteristic of benign physiologic  endometrial activity rather than malignancy. If the patient has abnormal uterine bleeding or if otherwise clinically warranted, pelvic sonography could be utilized for further characterization of the endometrium. Postoperative findings and low-grade metabolic activity in the vicinity of the excision biopsy of the left inguinal lymph node. No other hypermetabolic adenopathy in the abdomen/pelvis identified"  01/01/2019 biopsy sampling ([id number]) from the [facility] shows lymphocyte predominant Hodgkin's lymphoma.  04/27/2019 PET/CT scan ([id number]) which revealed "1. Significant response to therapy of lymphoma. Residual small bilateral axillary nodes with low-level hypermetabolism. (Deauville) 2. 2. 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 3. Right ovarian hypermetabolism is likely physiologic and may represent underlying corpus luteal cyst."  2. Iron deficiency anemia - hgb improved to 12.5 from 8.9 with IV iron.   PLAN: -Discussed pt labwork today, 11/22/2020; microcytic, anemic. Other labs pending. -Advised pt that we will set her up for iron infusions at this time. -Advised pt that it would be okay to use Ensure or Boost between meals to increase daily calories, but not as meal replacements. -Recommended pt maintain a balanced meal plan and ensure she gets enough calories daily. -Advised pt to be careful of lifting that can cause a strained muscle and the pain she is experiencing. -Recommended pt f/u w OBGYN regarding abnormal PAP smear and any role for HPV vaccination. -Recommended pt start iron supplementation again three days weekly. -The pt has not been breastfeeding.  -No lab or clinical evidence of NLPHL recurrence at this time. Will continue watchful observation. -Will see back in 4 months with labs.    FOLLOW UP: IV Venofer weekly x 3 doses RTC with Dr Irene Limbo with labs in 4 months   The total time spent in the appointment  was 20 minutes and more than 50% was on counseling  and direct patient cares.  All of the patient's questions were answered with apparent satisfaction. The patient knows to call the clinic with any problems, questions or concerns.    Sullivan Lone MD [place] AAHIVMS [facility] Hematology/Oncology Physician [facility]  (Office):       [phone] (Work cell):  [phone] (Fax):           [phone]  11/22/2020 2:33 PM   I, Reinaldo Raddle, am acting as scribe for Dr. Sullivan Lone, MD.   .I have reviewed the above documentation for accuracy and completeness, and I agree with the above. Brunetta Genera MD

## 2020-11-22 ENCOUNTER — Inpatient Hospital Stay (HOSPITAL_BASED_OUTPATIENT_CLINIC_OR_DEPARTMENT_OTHER): Payer: Medicaid Other | Admitting: Hematology

## 2020-11-22 ENCOUNTER — Other Ambulatory Visit: Payer: Self-pay

## 2020-11-22 ENCOUNTER — Inpatient Hospital Stay: Payer: Medicaid Other | Attending: Hematology

## 2020-11-22 VITALS — BP 104/58 | HR 82 | Temp 98.4°F | Resp 17 | Wt 99.7 lb

## 2020-11-22 DIAGNOSIS — Z8571 Personal history of Hodgkin lymphoma: Secondary | ICD-10-CM | POA: Diagnosis present

## 2020-11-22 DIAGNOSIS — D509 Iron deficiency anemia, unspecified: Secondary | ICD-10-CM | POA: Insufficient documentation

## 2020-11-22 DIAGNOSIS — D500 Iron deficiency anemia secondary to blood loss (chronic): Secondary | ICD-10-CM

## 2020-11-22 DIAGNOSIS — C8108 Nodular lymphocyte predominant Hodgkin lymphoma, lymph nodes of multiple sites: Secondary | ICD-10-CM | POA: Diagnosis not present

## 2020-11-22 LAB — CBC WITH DIFFERENTIAL/PLATELET
Abs Immature Granulocytes: 0.01 10*3/uL (ref 0.00–0.07)
Basophils Absolute: 0.1 10*3/uL (ref 0.0–0.1)
Basophils Relative: 1 %
Eosinophils Absolute: 0.2 10*3/uL (ref 0.0–0.5)
Eosinophils Relative: 4 %
HCT: 29.1 % — ABNORMAL LOW (ref 36.0–46.0)
Hemoglobin: 8.8 g/dL — ABNORMAL LOW (ref 12.0–15.0)
Immature Granulocytes: 0 %
Lymphocytes Relative: 41 %
Lymphs Abs: 1.8 10*3/uL (ref 0.7–4.0)
MCH: 20 pg — ABNORMAL LOW (ref 26.0–34.0)
MCHC: 30.2 g/dL (ref 30.0–36.0)
MCV: 66 fL — ABNORMAL LOW (ref 80.0–100.0)
Monocytes Absolute: 0.4 10*3/uL (ref 0.1–1.0)
Monocytes Relative: 9 %
Neutro Abs: 2 10*3/uL (ref 1.7–7.7)
Neutrophils Relative %: 45 %
Platelets: 202 10*3/uL (ref 150–400)
RBC: 4.41 MIL/uL (ref 3.87–5.11)
RDW: 17.7 % — ABNORMAL HIGH (ref 11.5–15.5)
WBC: 4.4 10*3/uL (ref 4.0–10.5)
nRBC: 0 % (ref 0.0–0.2)

## 2020-11-22 LAB — CMP (CANCER CENTER ONLY)
ALT: 11 U/L (ref 0–44)
AST: 17 U/L (ref 15–41)
Albumin: 3.9 g/dL (ref 3.5–5.0)
Alkaline Phosphatase: 64 U/L (ref 38–126)
Anion gap: 11 (ref 5–15)
BUN: 8 mg/dL (ref 6–20)
CO2: 25 mmol/L (ref 22–32)
Calcium: 9.4 mg/dL (ref 8.9–10.3)
Chloride: 105 mmol/L (ref 98–111)
Creatinine: 0.76 mg/dL (ref 0.44–1.00)
GFR, Estimated: >60 mL/min (ref >60)
Glucose, Bld: 85 mg/dL (ref 70–99)
Potassium: 3.7 mmol/L (ref 3.5–5.1)
Sodium: 141 mmol/L (ref 135–145)
Total Bilirubin: 0.3 mg/dL (ref 0.3–1.2)
Total Protein: 7 g/dL (ref 6.5–8.1)

## 2020-11-22 LAB — IRON AND TIBC
Iron: 10 ug/dL — ABNORMAL LOW (ref 41–142)
Saturation Ratios: 2 % — ABNORMAL LOW (ref 21–57)
TIBC: 526 ug/dL — ABNORMAL HIGH (ref 236–444)
UIBC: 516 ug/dL — ABNORMAL HIGH (ref 120–384)

## 2020-11-22 LAB — LACTATE DEHYDROGENASE: LDH: 135 U/L (ref 98–192)

## 2020-11-22 LAB — FERRITIN: Ferritin: <4 ng/mL — ABNORMAL LOW (ref 11–307)

## 2020-11-22 MED ORDER — POLYSACCHARIDE IRON COMPLEX 150 MG PO CAPS: 150.0000 mg | ORAL_CAPSULE | Freq: Every day | ORAL | 6 refills | Status: DC

## 2020-11-28 ENCOUNTER — Encounter: Payer: Self-pay | Admitting: Hematology

## 2020-12-01 ENCOUNTER — Telehealth: Payer: Self-pay | Admitting: Hematology

## 2020-12-01 NOTE — Telephone Encounter (Signed)
Scheduled follow-up appointments per 5/25 los. Patient is aware.

## 2020-12-02 ENCOUNTER — Telehealth: Payer: Self-pay

## 2020-12-02 ENCOUNTER — Inpatient Hospital Stay: Payer: Medicaid Other | Attending: Hematology

## 2020-12-02 DIAGNOSIS — N920 Excessive and frequent menstruation with regular cycle: Secondary | ICD-10-CM | POA: Insufficient documentation

## 2020-12-02 DIAGNOSIS — D500 Iron deficiency anemia secondary to blood loss (chronic): Secondary | ICD-10-CM | POA: Insufficient documentation

## 2020-12-02 DIAGNOSIS — Z8571 Personal history of Hodgkin lymphoma: Secondary | ICD-10-CM | POA: Insufficient documentation

## 2020-12-02 DIAGNOSIS — R630 Anorexia: Secondary | ICD-10-CM | POA: Insufficient documentation

## 2020-12-11 ENCOUNTER — Ambulatory Visit (HOSPITAL_COMMUNITY): Payer: Medicaid Other

## 2020-12-13 ENCOUNTER — Inpatient Hospital Stay (HOSPITAL_COMMUNITY): Payer: Medicaid Other

## 2020-12-18 ENCOUNTER — Ambulatory Visit (HOSPITAL_COMMUNITY): Payer: Medicaid Other

## 2020-12-20 ENCOUNTER — Other Ambulatory Visit: Payer: Self-pay

## 2020-12-20 ENCOUNTER — Inpatient Hospital Stay (HOSPITAL_COMMUNITY): Payer: Medicaid Other | Attending: Hematology and Oncology

## 2020-12-20 ENCOUNTER — Observation Stay (HOSPITAL_COMMUNITY)
Admission: EM | Admit: 2020-12-20 | Discharge: 2020-12-21 | Disposition: A | Discharge status: Home / Self Care | Payer: Medicaid Other | Attending: Family Medicine | Admitting: Family Medicine

## 2020-12-20 ENCOUNTER — Encounter (HOSPITAL_COMMUNITY): Payer: Self-pay | Admitting: Hematology and Oncology

## 2020-12-20 ENCOUNTER — Ambulatory Visit (HOSPITAL_COMMUNITY): Payer: Medicaid Other

## 2020-12-20 ENCOUNTER — Inpatient Hospital Stay (HOSPITAL_BASED_OUTPATIENT_CLINIC_OR_DEPARTMENT_OTHER): Payer: Medicaid Other | Admitting: Hematology and Oncology

## 2020-12-20 ENCOUNTER — Encounter (HOSPITAL_COMMUNITY): Payer: Self-pay

## 2020-12-20 VITALS — BP 113/79 | HR 91 | Temp 97.0°F | Resp 17

## 2020-12-20 DIAGNOSIS — R112 Nausea with vomiting, unspecified: Secondary | ICD-10-CM | POA: Diagnosis present

## 2020-12-20 DIAGNOSIS — Z20822 Contact with and (suspected) exposure to covid-19: Secondary | ICD-10-CM | POA: Insufficient documentation

## 2020-12-20 DIAGNOSIS — T454X5A Adverse effect of iron and its compounds, initial encounter: Secondary | ICD-10-CM | POA: Diagnosis not present

## 2020-12-20 DIAGNOSIS — Z91010 Allergy to peanuts: Secondary | ICD-10-CM | POA: Insufficient documentation

## 2020-12-20 DIAGNOSIS — J45909 Unspecified asthma, uncomplicated: Secondary | ICD-10-CM | POA: Insufficient documentation

## 2020-12-20 DIAGNOSIS — T8090XA Unspecified complication following infusion and therapeutic injection, initial encounter: Secondary | ICD-10-CM

## 2020-12-20 DIAGNOSIS — T7840XA Allergy, unspecified, initial encounter: Secondary | ICD-10-CM | POA: Diagnosis not present

## 2020-12-20 DIAGNOSIS — D500 Iron deficiency anemia secondary to blood loss (chronic): Secondary | ICD-10-CM | POA: Diagnosis not present

## 2020-12-20 DIAGNOSIS — C8108 Nodular lymphocyte predominant Hodgkin lymphoma, lymph nodes of multiple sites: Secondary | ICD-10-CM | POA: Diagnosis not present

## 2020-12-20 DIAGNOSIS — D509 Iron deficiency anemia, unspecified: Secondary | ICD-10-CM | POA: Insufficient documentation

## 2020-12-20 LAB — COMPREHENSIVE METABOLIC PANEL
ALT: 13 U/L (ref 0–44)
AST: 20 U/L (ref 15–41)
Albumin: 3.4 g/dL — ABNORMAL LOW (ref 3.5–5.0)
Alkaline Phosphatase: 52 U/L (ref 38–126)
Anion gap: 7 (ref 5–15)
BUN: 10 mg/dL (ref 6–20)
CO2: 21 mmol/L — ABNORMAL LOW (ref 22–32)
Calcium: 8.9 mg/dL (ref 8.9–10.3)
Chloride: 109 mmol/L (ref 98–111)
Creatinine, Ser: 0.87 mg/dL (ref 0.44–1.00)
GFR, Estimated: >60 mL/min (ref >60)
Glucose, Bld: 144 mg/dL — ABNORMAL HIGH (ref 70–99)
Potassium: 3.9 mmol/L (ref 3.5–5.1)
Sodium: 137 mmol/L (ref 135–145)
Total Bilirubin: 0.5 mg/dL (ref 0.3–1.2)
Total Protein: 6 g/dL — ABNORMAL LOW (ref 6.5–8.1)

## 2020-12-20 LAB — CBC WITH DIFFERENTIAL/PLATELET
Abs Immature Granulocytes: 0.06 10*3/uL (ref 0.00–0.07)
Basophils Absolute: 0 10*3/uL (ref 0.0–0.1)
Basophils Relative: 0 %
Eosinophils Absolute: 0 10*3/uL (ref 0.0–0.5)
Eosinophils Relative: 0 %
HCT: 38.3 % (ref 36.0–46.0)
Hemoglobin: 11.1 g/dL — ABNORMAL LOW (ref 12.0–15.0)
Immature Granulocytes: 1 %
Lymphocytes Relative: 4 %
Lymphs Abs: 0.4 10*3/uL — ABNORMAL LOW (ref 0.7–4.0)
MCH: 19.7 pg — ABNORMAL LOW (ref 26.0–34.0)
MCHC: 29 g/dL — ABNORMAL LOW (ref 30.0–36.0)
MCV: 68 fL — ABNORMAL LOW (ref 80.0–100.0)
Monocytes Absolute: 0 10*3/uL — ABNORMAL LOW (ref 0.1–1.0)
Monocytes Relative: 0 %
Neutro Abs: 9 10*3/uL — ABNORMAL HIGH (ref 1.7–7.7)
Neutrophils Relative %: 95 %
Platelets: 232 10*3/uL (ref 150–400)
RBC: 5.63 MIL/uL — ABNORMAL HIGH (ref 3.87–5.11)
RDW: 20.1 % — ABNORMAL HIGH (ref 11.5–15.5)
WBC: 9.5 10*3/uL (ref 4.0–10.5)
nRBC: 0 % (ref 0.0–0.2)

## 2020-12-20 LAB — MAGNESIUM: Magnesium: 1.9 mg/dL (ref 1.7–2.4)

## 2020-12-20 LAB — PHOSPHORUS: Phosphorus: 2.8 mg/dL (ref 2.5–4.6)

## 2020-12-20 LAB — HCG, SERUM, QUALITATIVE: Preg, Serum: NEGATIVE

## 2020-12-20 MED ORDER — SODIUM CHLORIDE 0.9 % IV SOLN: Freq: Once | INTRAVENOUS | Status: AC

## 2020-12-20 MED ORDER — ACETAMINOPHEN 325 MG PO TABS
650.0000 mg | ORAL_TABLET | Freq: Once | ORAL | Status: AC
  Administered 2020-12-20: 650 mg via ORAL

## 2020-12-20 MED ORDER — DIPHENHYDRAMINE HCL 25 MG PO CAPS
25.0000 mg | ORAL_CAPSULE | Freq: Once | ORAL | Status: AC
  Administered 2020-12-20: 25 mg via ORAL

## 2020-12-20 MED ORDER — ONDANSETRON HCL 4 MG PO TABS: 4.0000 mg | ORAL_TABLET | Freq: Four times a day (QID) | ORAL | Status: DC | PRN

## 2020-12-20 MED ORDER — METHYLPREDNISOLONE SODIUM SUCC 125 MG IJ SOLR
40.0000 mg | Freq: Once | INTRAMUSCULAR | Status: AC
  Administered 2020-12-20: 125 mg via INTRAVENOUS

## 2020-12-20 MED ORDER — ACETAMINOPHEN 650 MG RE SUPP: 650.0000 mg | Freq: Four times a day (QID) | RECTAL | Status: DC | PRN

## 2020-12-20 MED ORDER — DIPHENHYDRAMINE HCL 50 MG/ML IJ SOLN
INTRAMUSCULAR | Status: AC
  Filled 2020-12-20: qty 1

## 2020-12-20 MED ORDER — DIPHENHYDRAMINE HCL 50 MG/ML IJ SOLN
25.0000 mg | Freq: Four times a day (QID) | INTRAMUSCULAR | Status: DC
  Administered 2020-12-20 – 2020-12-21 (×3): 25 mg via INTRAVENOUS
  Filled 2020-12-20 (×3): qty 1

## 2020-12-20 MED ORDER — SODIUM CHLORIDE 0.9 % IV BOLUS
500.0000 mL | Freq: Once | INTRAVENOUS | Status: AC
  Administered 2020-12-20: 500 mL via INTRAVENOUS

## 2020-12-20 MED ORDER — ACETAMINOPHEN 325 MG PO TABS: 650.0000 mg | ORAL_TABLET | Freq: Four times a day (QID) | ORAL | Status: DC | PRN

## 2020-12-20 MED ORDER — LORATADINE 10 MG PO TABS
10.0000 mg | ORAL_TABLET | Freq: Once | ORAL | Status: AC
  Administered 2020-12-20: 10 mg via ORAL

## 2020-12-20 MED ORDER — LORATADINE 10 MG PO TABS
ORAL_TABLET | ORAL | Status: AC
  Filled 2020-12-20: qty 1

## 2020-12-20 MED ORDER — ONDANSETRON HCL 4 MG/2ML IJ SOLN: 4.0000 mg | Freq: Four times a day (QID) | INTRAMUSCULAR | Status: DC | PRN

## 2020-12-20 MED ORDER — DIPHENHYDRAMINE HCL 25 MG PO CAPS
ORAL_CAPSULE | ORAL | Status: AC
  Filled 2020-12-20: qty 1

## 2020-12-20 MED ORDER — FAMOTIDINE IN NACL 20-0.9 MG/50ML-% IV SOLN
20.0000 mg | Freq: Two times a day (BID) | INTRAVENOUS | Status: DC
  Administered 2020-12-20 – 2020-12-21 (×2): 20 mg via INTRAVENOUS
  Filled 2020-12-20 (×2): qty 50

## 2020-12-20 MED ORDER — METHYLPREDNISOLONE SODIUM SUCC 125 MG IJ SOLR
INTRAMUSCULAR | Status: AC
  Filled 2020-12-20: qty 2

## 2020-12-20 MED ORDER — ENOXAPARIN SODIUM 40 MG/0.4ML IJ SOSY: 40.0000 mg | PREFILLED_SYRINGE | INTRAMUSCULAR | Status: DC

## 2020-12-20 MED ORDER — METHYLPREDNISOLONE SODIUM SUCC 125 MG IJ SOLR
80.0000 mg | Freq: Three times a day (TID) | INTRAMUSCULAR | Status: AC
  Administered 2020-12-20 – 2020-12-21 (×2): 80 mg via INTRAVENOUS
  Filled 2020-12-20 (×2): qty 2

## 2020-12-20 MED ORDER — SODIUM CHLORIDE 0.9 % IV SOLN: INTRAVENOUS | Status: AC

## 2020-12-20 MED ORDER — SODIUM CHLORIDE 0.9 % IV SOLN
300.0000 mg | Freq: Once | INTRAVENOUS | Status: AC
  Administered 2020-12-20: 300 mg via INTRAVENOUS
  Filled 2020-12-20: qty 300

## 2020-12-20 MED ORDER — ACETAMINOPHEN 325 MG PO TABS
ORAL_TABLET | ORAL | Status: AC
  Filled 2020-12-20: qty 2

## 2020-12-20 NOTE — ED Notes (Signed)
Ring cutter used to cut pt ring off at this time. Leah Gould

## 2020-12-20 NOTE — Progress Notes (Signed)
13:20 Patient's Venofer Infusion complete. Patient called out and states that her arms felt tight and fingertips numb per patient's words. Upon assessment hand and forearms bilateral red and raised areas noted. No complaints of shortness of breath, itching or pain.   13:21 pm 500 ml bolus of Normal Saline initiated. Dr. Alvy Bimler notified.   13:26 pm Orders received/Dr. Alvy Bimler to give 25 mgs Benadryl PO now.   13:27 pm Patient has complaints of itching bilateral on arms. Red, raised areas noted bilateral on forearms. Reported to Dr. Alvy Bimler. Message received to place patient on her schedule.   13:34 pm Orders received/Dr. Alvy Bimler 125mg  Solumedrol IV x 1 dose now.   13:38 pm patient complains of itching and numbness in hands bilateral. Reported symptoms to Dr. Alvy Bimler.   13:57 pm Dr. Alvy Bimler at the bedside. Assessment performed. Plan of care discussed with patient. Orders received to monitor patient another 30 minutes and recheck symptoms.   15:00 pm Orders received from Dr. Alvy Bimler may discharge home. Patient to call and get a family member to pick her up due to being drowsy from the Benadryl. Understanding verbalized.   Treatment given today per MD orders.Vital signs stable. No complaints at this time. Discharged from clinic ambulatory in stable condition. Alert and oriented x 3. F/U with [facility] as scheduled.

## 2020-12-20 NOTE — ED Triage Notes (Signed)
Pt c/o weakness, vomiting following an iron infusion at the cancer center. Per aunt, pt had a reaction with hives during infusion.

## 2020-12-20 NOTE — Assessment & Plan Note (Signed)
The patient developed clear-cut infusion reaction to IV iron sucrose She received acetaminophen and Claritin as premedication only On review of her record, she did receive IV steroids prior to iron sucrose in the past but that information was not shared With additional steroids and oral Benadryl, her hives resolved and her symptoms resolved prior to discharge I plan to change her treatment to IV Feraheme next week along with premedication I will cancel her July IV iron infusion.  She may not needed after IV Feraheme I will discuss her case with her hematologist next week when he returns from vacation I will call her next week to check on her

## 2020-12-20 NOTE — Progress Notes (Signed)
Called to room by patient, she was reporting burning at insertion site of her IV and up her arm about 3 inches. Infusion paused,  IV repositioned with no pain relief. IV flushed with normal saline with added pain. Pump remained paused.  IV removed and new site started.  Pump restarted at previous rate charted by B. Presnell, RN.    Patient tolerated new IV start without incidence.  No c/o of issues at new IV site.  Primary RN aware.

## 2020-12-20 NOTE — ED Notes (Signed)
Pt ring removed without complication, pt medicated per MAR. Mother made aware of visiting hours and verbalizes understanding. Pt up to bathroom with mother, awaiting return to room.

## 2020-12-20 NOTE — Assessment & Plan Note (Signed)
She received steroids, Benadryl, Claritin and acetaminophen Prior to discharge, she is stable We will check on her tomorrow The patient is advised not to drive if she is still sedated from side effects of Benadryl

## 2020-12-20 NOTE — ED Provider Notes (Signed)
[facility] EMERGENCY DEPARTMENT Provider Note   CSN: [id number] Arrival date & time: 12/20/20  1707     History Chief Complaint  Patient presents with   Loss of Consciousness    Leah Gould is a 25 y.o. female with pertinent past medical histories of Hodgkin's lymphoma in remission, iron deficiency anemia that presents to the emergency department today for nausea vomiting and weakness.  Patient went to get an iron infusion today, patient states that she started developing a infusion reaction.  They switched line for L arm to R arm. States that she has not had an infusion reaction for a long time, according to Dr. Calton Dach note Per chart review, patient was not premedicated with steroids, she had received acetaminophen and Claritin as premedication only.  She was then give steroids and benadryl after reaction began. After steroids and oral Benadryl more given her hives and her symptoms had mostly resolved prior to discharge.  Patient states that prior to discharge her arms and her hands were still swollen.  Patient states that after she got into her car after leaving the infusion center and started vomiting vigorously.  States that she was so weak after vomiting she thinks that while she was vomiting she might of syncopized for a couple seconds.  Did not fall down or hit her head.  Denies any chance of pregnancy.  Patient states that she feels much better while coming here.  Denies any nausea currently.  Denies any vision changes, headache, dizziness.  Patient states that she does feel weak and her hands are still swollen.  States that they feel tight and numb and she cannot feel anything in her hands.  States this is similar to prior to leaving the inpatient clinic.  Patient denies any pain anywhere currently, was in her normal health prior to this.  Denies any fevers, hives have resolved.  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 ([address]) 01/28/2019   Counseling regarding advance care planning and goals of care 01/28/2019   Iron deficiency anemia due to chronic blood loss 01/05/2019   Inguinal adenopathy 12/22/2018    Past Surgical History:  Procedure Laterality Date   MASS EXCISION Left 12/22/2018   Procedure: EXCISION DEEP LEFT INGUINAL LYMPH NODE;  Surgeon: Fanny Skates, MD;  Location: [facility];  Service: General;  Laterality: Left;     OB History     Gravida  2   Para  1   Term  1   Preterm      AB  1   Living  1      SAB  1   IAB      Ectopic      Multiple  0   Live Births  1           History reviewed. No pertinent family history.  Social History   Tobacco Use   Smoking status: Never   Smokeless tobacco: Never  Vaping Use   Vaping Use: Never used  Substance Use Topics   Alcohol use: No   Drug use:  No    Home Medications Prior to Admission medications   Medication Sig Start Date End Date Taking? Authorizing Provider  iron polysaccharides (NIFEREX) 150 MG capsule Take 1 capsule (150 mg total) by mouth daily. 11/22/20  Yes Brunetta Genera, MD  ZAFEMY [phone] MCG/24HR transdermal patch 1 patch once a week. 11/30/20  Yes [provider]    Allergies    Fruit & vegetable daily [nutritional supplements], Peanut-containing drug products, and Iron sucrose  Review of Systems   Review of Systems  Constitutional:  Negative for chills, diaphoresis, fatigue and fever.  HENT:  Negative for congestion, sore throat and trouble swallowing.   Eyes:  Negative for pain and visual disturbance.  Respiratory:  Negative for cough, shortness of breath and wheezing.    Cardiovascular:  Negative for chest pain, palpitations and leg swelling.  Gastrointestinal:  Positive for nausea and vomiting. Negative for abdominal distention, abdominal pain and diarrhea.  Genitourinary:  Negative for difficulty urinating.  Musculoskeletal:  Negative for back pain, neck pain and neck stiffness.  Skin:  Negative for pallor.  Neurological:  Positive for weakness. Negative for dizziness, speech difficulty and headaches.  Psychiatric/Behavioral:  Negative for confusion.    Physical Exam Updated Vital Signs BP 110/77   Pulse 80   Temp 98.7 F (37.1 C) (Oral)   Resp 16   Ht 5\' 3"  (1.6 m)   Wt 45.2 kg   SpO2 99%   BMI 17.65 kg/m   Physical Exam Constitutional:      General: She is not in acute distress.    Appearance: Normal appearance. She is not ill-appearing, toxic-appearing or diaphoretic.     Comments: Patient is sleepy, however easily arousable.  No respiratory distress, no focal weakness.  HENT:     Mouth/Throat:     Mouth: Mucous membranes are moist.     Pharynx: Oropharynx is clear.     Comments: No stridor Eyes:     General: No scleral icterus.    Extraocular Movements: Extraocular movements intact.     Pupils: Pupils are equal, round, and reactive to light.  Cardiovascular:     Rate and Rhythm: Normal rate and regular rhythm.     Pulses: Normal pulses.     Heart sounds: Normal heart sounds.  Pulmonary:     Effort: Pulmonary effort is normal. No respiratory distress.     Breath sounds: Normal breath sounds. No stridor. No wheezing, rhonchi or rales.  Chest:     Chest wall: No tenderness.  Abdominal:     General: Abdomen is flat. There is no distension.     Palpations: Abdomen is soft.     Tenderness: There is no abdominal tenderness. There is no guarding or rebound.  Musculoskeletal:        General: No swelling or tenderness. Normal range of motion.     Cervical back: Normal range of motion and neck supple. No rigidity.     Right lower leg:  No edema.     Left lower leg: No edema.     Comments: Patient with bilateral hands swollen, unable to make a fist due to swelling.  Normal cap refill with radial pulse 2+.  Able to flex and extend fingers and wrist.  Patient unable to feel sharp or light sensation bilaterally up until wrist.  Skin:    General: Skin is warm and dry.     Capillary Refill: Capillary refill takes less than 2 seconds.     Coloration: Skin is not  pale.     Comments: No hives  Neurological:     General: No focal deficit present.     Mental Status: She is alert and oriented to person, place, and time.     Comments: Alert. Clear speech. No facial droop. CNIII-XII grossly intact.  Bilateral upper hands are swollen, weakness noted to fingers and wrist, patient is unable to fully close hand due to swelling.  States that she is unable to feel me with sharp or light sensation up until mid wrist bilaterally.  Normal strength and sensation to forearm, elbow and shoulder.  5/5 symmetric strength with shoulder and with plantar and dorsi flexion bilaterally.  Negative pronator drift. Gait is steady and intact.    Psychiatric:        Mood and Affect: Mood normal.        Behavior: Behavior normal.    ED Results / Procedures / Treatments   Labs (all labs ordered are listed, but only abnormal results are displayed) Labs Reviewed  CBC WITH DIFFERENTIAL/PLATELET - Abnormal; Notable for the following components:      Result Value   RBC 5.63 (*)    Hemoglobin 11.1 (*)    MCV 68.0 (*)    MCH 19.7 (*)    MCHC 29.0 (*)    RDW 20.1 (*)    Neutro Abs 9.0 (*)    Lymphs Abs 0.4 (*)    Monocytes Absolute 0.0 (*)    All other components within normal limits  COMPREHENSIVE METABOLIC PANEL - Abnormal; Notable for the following components:   CO2 21 (*)    Glucose, Bld 144 (*)    Total Protein 6.0 (*)    Albumin 3.4 (*)    All other components within normal limits  MAGNESIUM  PHOSPHORUS  I-STAT BETA HCG BLOOD, ED ([facility], [facility], [facility] ONLY)     EKG None  Radiology No results found.  Procedures Procedures   Medications Ordered in ED Medications  sodium chloride 0.9 % bolus 500 mL (500 mLs Intravenous New Bag/Given 12/20/20 1910)    ED Course  I have reviewed the triage vital signs and the nursing notes.  Pertinent labs & imaging results that were available during my care of the patient were reviewed by me and considered in my medical decision making (see chart for details).    MDM Rules/Calculators/A&P                          Patient presents to the emergency department today for weakness nausea vomiting after iron infusion.  Patient states that while being here she is significantly improved, does not feel nauseous anymore.  We will obtain basic labs and electrolytes at this time and give small bolus of fluids.  In regards to neuro exam, patient is unable to feel sensation to bilateral hands, hands are swollen.  No tendon involvement.  Does not follow neurological pattern, patient states that it was worse when she was in the clinic after the reaction and the swelling and numbness and sensation loss was from her elbows down, now only present in her hands.  Patient has not vomited, will check electrolytes at this time and observe.  Patient is already received steroids and Benadryl at office.  Patient without any airway compromise, appears stable.  Spoke to Dr. Lorenso Courier from hematology who recommends patient to be admitted at this time for observation due to symptoms.    Dr. Almyra Free spoke to hospitalist about admission.  The patient appears reasonably stabilized for admission considering the current resources, flow, and capabilities available in the ED at this time, and I doubt any other [facility] requiring further screening and/or treatment in the ED prior to admission.  I discussed this case with my attending physician who cosigned this note including patient's presenting symptoms, physical exam, and planned diagnostics and  interventions. Attending physician stated agreement with plan or made changes to plan which were implemented.   Attending physician assessed patient at bedside.   Final Clinical Impression(s) / ED Diagnoses Final diagnoses:  Infusion reaction, initial encounter    Rx / DC Orders ED Discharge Orders     None        Alfredia Client, PA-C 12/20/20 2118    Luna Fuse, MD 12/20/20 2202

## 2020-12-20 NOTE — Progress Notes (Signed)
[facility] OFFICE PROGRESS NOTE  Leah Huxley, PA  ASSESSMENT & PLAN:  Iron deficiency anemia due to chronic blood loss The patient developed clear-cut infusion reaction to IV iron sucrose She received acetaminophen and Claritin as premedication only On review of her record, she did receive IV steroids prior to iron sucrose in the past but that information was not shared With additional steroids and oral Benadryl, her hives resolved and her symptoms resolved prior to discharge I plan to change her treatment to IV Feraheme next week along with premedication I will cancel her July IV iron infusion.  She may not needed after IV Feraheme I will discuss her case with her hematologist next week when he returns from vacation I will call her next week to check on her   Infusion reaction She received steroids, Benadryl, Claritin and acetaminophen Prior to discharge, she is stable We will check on her tomorrow The patient is advised not to drive if she is still sedated from side effects of Benadryl  No orders of the defined types were placed in this encounter. IV iron sucrose is entered as allergy  The total time spent in the appointment was 40 minutes encounter with patients including review of chart and various tests results, discussions about plan of care and coordination of care plan   All questions were answered. The patient knows to call the clinic with any problems, questions or concerns. No barriers to learning was detected.    Heath Lark, MD 6/22/20225:16 PM  INTERVAL HISTORY: Leah Gould 25 y.o. female is seen due to reaction to IV iron sucrose I was alerted by infusion nurses that she had swelling, abdominal discomfort and hives at the conclusion of IV iron infusion On review of her records, she did not receive premedication steroid this visit She typically receives her care at [facility] but is transferred to [facility] for IV iron due to [facility] infusion  room She is examined and evaluated 3 times throughout her visit Soon after she received IV steroids, her hives improved but she becomes sedated from IV Benadryl Prior to discharge, her symptoms has resolved  SUMMARY OF HEMATOLOGIC HISTORY: This is a patient of Dr. Irene Limbo with severe iron deficiency anemia She have received intravenous iron sucrose in the past with good results She is scheduled to receive intravenous iron sucrose 300 mg x 3  I have reviewed the past medical history, past surgical history, social history and family history with the patient and they are unchanged from previous note.  ALLERGIES:  is allergic to fruit & vegetable daily [nutritional supplements], peanut-containing drug products, and iron sucrose.  MEDICATIONS:  Current Outpatient Medications  Medication Sig Dispense Refill   iron polysaccharides (NIFEREX) 150 MG capsule Take 1 capsule (150 mg total) by mouth daily. 60 capsule 6   ZAFEMY 150-35 MCG/24HR transdermal patch 1 patch once a week.     No current facility-administered medications for this visit.     REVIEW OF SYSTEMS:   Constitutional: Denies fevers, chills or night sweats Eyes: Denies blurriness of vision Ears, nose, mouth, throat, and face: Denies mucositis or sore throat Respiratory: Denies cough, dyspnea or wheezes Cardiovascular: Denies palpitation, chest discomfort or lower extremity swelling Lymphatics: Denies new lymphadenopathy or easy bruising Neurological:Denies numbness, tingling or new weaknesses Behavioral/Psych: Mood is stable, no new changes  All other systems were reviewed with the patient and are negative.  PHYSICAL EXAMINATION: ECOG PERFORMANCE STATUS: 1 - Symptomatic but completely ambulatory GENERAL:alert,  no distress and comfortable SKIN: She has skin hives on her arms EYES: normal, Conjunctiva are pink and non-injected, sclera clear OROPHARYNX:no exudate, no erythema and lips, buccal mucosa, and tongue normal  NECK:  supple, thyroid normal size, non-tender, without nodularity LYMPH:  no palpable lymphadenopathy in the cervical, axillary or inguinal LUNGS: clear to auscultation and percussion with normal breathing effort HEART: regular rate & rhythm and no murmurs and no lower extremity edema ABDOMEN:abdomen soft, non-tender and normal bowel sounds Musculoskeletal:no cyanosis of digits and no clubbing  NEURO: alert & oriented x 3 with fluent speech, no focal motor/sensory deficits  LABORATORY DATA:  I have reviewed the data as listed     Component Value Date/Time   NA 141 11/22/2020 1349   K 3.7 11/22/2020 1349   CL 105 11/22/2020 1349   CO2 25 11/22/2020 1349   GLUCOSE 85 11/22/2020 1349   BUN 8 11/22/2020 1349   CREATININE 0.76 11/22/2020 1349   CALCIUM 9.4 11/22/2020 1349   PROT 7.0 11/22/2020 1349   ALBUMIN 3.9 11/22/2020 1349   AST 17 11/22/2020 1349   ALT 11 11/22/2020 1349   ALKPHOS 64 11/22/2020 1349   BILITOT 0.3 11/22/2020 1349   GFRNONAA >60 11/22/2020 1349   GFRAA >60 03/22/2020 1357    No results found for: SPEP, UPEP  Lab Results  Component Value Date   WBC 4.4 11/22/2020   NEUTROABS 2.0 11/22/2020   HGB 8.8 (L) 11/22/2020   HCT 29.1 (L) 11/22/2020   MCV 66.0 (L) 11/22/2020   PLT 202 11/22/2020      Chemistry      Component Value Date/Time   NA 141 11/22/2020 1349   K 3.7 11/22/2020 1349   CL 105 11/22/2020 1349   CO2 25 11/22/2020 1349   BUN 8 11/22/2020 1349   CREATININE 0.76 11/22/2020 1349      Component Value Date/Time   CALCIUM 9.4 11/22/2020 1349   ALKPHOS 64 11/22/2020 1349   AST 17 11/22/2020 1349   ALT 11 11/22/2020 1349   BILITOT 0.3 11/22/2020 1349

## 2020-12-20 NOTE — Patient Instructions (Signed)
[facility]  Discharge Instructions: Thank you for choosing [facility] to provide your oncology and hematology care.  If you have a lab appointment with the [facility], please come in thru the Main Entrance and check in at the main information desk.  We strive to give you quality time with your provider. You may need to reschedule your appointment if you arrive late (15 or more minutes).  Arriving late affects you and other patients whose appointments are after yours.  Also, if you miss three or more appointments without notifying the office, you may be dismissed from the clinic at the provider's discretion.      For prescription refill requests, have your pharmacy contact our office and allow 72 hours for refills to be completed.    Today you received : Venofer 300 mg IV infusion.        BELOW ARE SYMPTOMS THAT SHOULD BE REPORTED IMMEDIATELY: *FEVER GREATER THAN 100.4 F (38 C) OR HIGHER *CHILLS OR SWEATING *NAUSEA AND VOMITING THAT IS NOT CONTROLLED WITH YOUR NAUSEA MEDICATION *UNUSUAL SHORTNESS OF BREATH *UNUSUAL BRUISING OR BLEEDING *URINARY PROBLEMS (pain or burning when urinating, or frequent urination) *BOWEL PROBLEMS (unusual diarrhea, constipation, pain near the anus) TENDERNESS IN MOUTH AND THROAT WITH OR WITHOUT PRESENCE OF ULCERS (sore throat, sores in mouth, or a toothache) UNUSUAL RASH, SWELLING OR PAIN  UNUSUAL VAGINAL DISCHARGE OR ITCHING   Items with * indicate a potential emergency and should be followed up as soon as possible or go to the Emergency Department if any problems should occur.  Please show the CHEMOTHERAPY ALERT CARD or IMMUNOTHERAPY ALERT CARD at check-in to the Emergency Department and triage nurse.  Should you have questions after your visit or need to cancel or reschedule your appointment, please contact [facility] [phone]  and follow the prompts.  Office hours are 8:00 a.m. to 4:30 p.m. Monday - Friday.  Please note that voicemails left after 4:00 p.m. may not be returned until the following business day.  We are closed weekends and major holidays. You have access to a nurse at all times for urgent questions. Please call the main number to the clinic [phone] and follow the prompts.  For any non-urgent questions, you may also contact your provider using MyChart. We now offer e-Visits for anyone 56 and older to request care online for non-urgent symptoms. For details visit [email or web address].   Also download the MyChart app! Go to the app store, search "MyChart", open the app, select [facility], and log in with your MyChart username and password.  Due to Covid, a mask is required upon entering the hospital/clinic. If you do not have a mask, one will be given to you upon arrival. For doctor visits, patients may have 1 support person aged 28 or older with them. For treatment visits, patients cannot have anyone with them due to current Covid guidelines and our immunocompromised population.

## 2020-12-20 NOTE — H&P (Addendum)
TRH H&P   Patient Demographics:    Leah Gould, is a 25 y.o. female  MRN: [id number]   DOB - [birth year]  Admit Date - 12/20/2020  Outpatient Primary MD for the patient is [name], PA  Referring MD/NP/PA: Dr Almyra Free  Outpatient Specialists: oncology Dr Kale/Dr Alvy Bimler  Patient coming from: infusion center  Chief Complaint  Patient presents with   Loss of Consciousness      HPI:    Leah Gould  is a 25 y.o. female, with past medical history of Hodgkin's lymphoma, s/p rituximab, in remission, iron deficiency anemia, she presents to ED secondary to nausea, vomiting, weakness, and bilateral upper extremity swelling, tingling and numbness, status post allergic reaction to iron transfusion today, patient went for iron transfusion today in the infusion center, where she did develop infusion reaction, they switched line from left arm to right arm, cording to infusion center note, patient received Tylenol and Claritin as premedication only, and she did not receive steroids as premedications, did receive Benadryl and steroids for her reaction, and did report hives, abdominal cramps, but she did deny any feeling of throat closing, shortness of breath, or increased oral secretions, ports she did feel weak, she did have a vomiting, reports she might felt she syncopized for a second or 2, did not fall or hit her head, denies any chances of pregnancy, reports she is using contraception, currently denies any focal deficits, vision changes, headache, she reports feeling her hands puffed up, where it feels tingling and numbness with some decreased sensation, but she denies any pain. -in ED patient with some bilateral upper extremity swelling, but she has good pulses, [facility] consulted to admit for observation    Mother was present during entire evaluation and assessment, all her  questions were answered.   Review of systems:    In addition to the HPI above, No Fever-chills, she reports generalized weakness. No Headache, No changes with Vision or hearing, No problems swallowing food or Liquids, No Chest pain, Cough or Shortness of Breath, Did report abdominal cramps, nausea and vomiting, this has resolved, no diarrhea or constipation No Blood in stool or Urine, No dysuria, No new skin rashes or bruises, No new joints pains-aches,  No new weakness, does report some tingling and numbness in her bilateral upper extremities No recent weight gain or loss, No polyuria, polydypsia or polyphagia, No significant Mental Stressors.  A full 10 point Review of Systems was done, except as stated above, all other Review of Systems were negative.   With Past History of the following :    Past Medical History:  Diagnosis Date   Anemia    Asthma    no meds, allergy induced   Hodgkin's lymphoma ([place])    Inguinal adenopathy 12/22/2018      Past Surgical History:  Procedure Laterality Date   MASS EXCISION Left 12/22/2018  Procedure: EXCISION DEEP LEFT INGUINAL LYMPH NODE;  Surgeon: Fanny Skates, MD;  Location: [facility];  Service: General;  Laterality: Left;      Social History:     Social History   Tobacco Use   Smoking status: Never   Smokeless tobacco: Never  Substance Use Topics   Alcohol use: No        Family History :    History reviewed. No pertinent family history.    Home Medications:   Prior to Admission medications   Medication Sig Start Date End Date Taking? Authorizing Provider  iron polysaccharides (NIFEREX) 150 MG capsule Take 1 capsule (150 mg total) by mouth daily. 11/22/20  Yes Brunetta Genera, MD  ZAFEMY [phone] MCG/24HR transdermal patch 1 patch once a week. 11/30/20  Yes [provider]     Allergies:     Allergies  Allergen Reactions   Fruit & Vegetable Daily [Nutritional Supplements]  Swelling    Pineapple, plum,    Peanut-Containing Drug Products Swelling   Iron Sucrose Hives     Physical Exam:   Vitals  Blood pressure 110/77, pulse 80, temperature 98.7 F (37.1 C), temperature source Oral, resp. rate 16, height 5\' 3"  (1.6 m), weight 45.2 kg, SpO2 99 %, unknown if currently breastfeeding.   1. General developed female, laying in bed in no apparent distress  2. Normal affect and insight, Not Suicidal or Homicidal, Awake Alert, Oriented X 3.  3. No F.N deficits, ALL C.Nerves Intact, Strength 5/5 all 4 extremities, Sensation intact all 4 extremities, Plantars down going.  4. Ears and Eyes appear Normal, Conjunctivae clear, PERRLA. Moist Oral Mucosa.  5. Supple Neck, No JVD, No cervical lymphadenopathy appriciated, No Carotid Bruits.  6. Symmetrical Chest wall movement, Good air movement bilaterally, CTAB.  7. RRR, No Gallops, Rubs or Murmurs, No Parasternal Heave.  8. Positive Bowel Sounds, Abdomen Soft, No tenderness, No organomegaly appriciated,No rebound -guarding or rigidity.  9.  No Cyanosis, Normal Skin Turgor, No Skin Rash or Bruise.  10. Good muscle tone,  joints appear normal , she has her bilateral hands with some edema, able to flex but limited due to swelling, she had good pulses bilaterally , tenderness or pain to palpation    Data Review:    CBC Recent Labs  Lab 12/20/20 1826  WBC 9.5  HGB 11.1*  HCT 38.3  PLT 232  MCV 68.0*  MCH 19.7*  MCHC 29.0*  RDW 20.1*  LYMPHSABS 0.4*  MONOABS 0.0*  EOSABS 0.0  BASOSABS 0.0   ------------------------------------------------------------------------------------------------------------------  Chemistries  Recent Labs  Lab 12/20/20 1826  NA 137  K 3.9  CL 109  CO2 21*  GLUCOSE 144*  BUN 10  CREATININE 0.87  CALCIUM 8.9  MG 1.9  AST 20  ALT 13  ALKPHOS 52  BILITOT 0.5    ------------------------------------------------------------------------------------------------------------------ estimated creatinine clearance is 71.8 mL/min (by C-G formula based on SCr of 0.87 mg/dL). ------------------------------------------------------------------------------------------------------------------ No results for input(s): TSH, T4TOTAL, T3FREE, THYROIDAB in the last 72 hours.  Invalid input(s): FREET3  Coagulation profile No results for input(s): INR, PROTIME in the last 168 hours. ------------------------------------------------------------------------------------------------------------------- No results for input(s): DDIMER in the last 72 hours. -------------------------------------------------------------------------------------------------------------------  Cardiac Enzymes No results for input(s): CKMB, TROPONINI, MYOGLOBIN in the last 168 hours.  Invalid input(s): CK ------------------------------------------------------------------------------------------------------------------ No results found for: BNP   ---------------------------------------------------------------------------------------------------------------  Urinalysis    Component Value Date/Time   COLORURINE YELLOW 06/03/2019 1110   APPEARANCEUR HAZY (A) 06/03/2019 1110  LABSPEC 1.012 06/03/2019 1110   PHURINE 5.0 06/03/2019 1110   GLUCOSEU NEGATIVE 06/03/2019 1110   HGBUR NEGATIVE 06/03/2019 1110   BILIRUBINUR NEGATIVE 06/03/2019 1110   BILIRUBINUR negative 10/05/2018 1331   KETONESUR NEGATIVE 06/03/2019 1110   PROTEINUR NEGATIVE 06/03/2019 1110   UROBILINOGEN 0.2 10/05/2018 1331   NITRITE NEGATIVE 06/03/2019 1110   LEUKOCYTESUR NEGATIVE 06/03/2019 1110    ----------------------------------------------------------------------------------------------------------------   Imaging Results:    No results found.  My personal review of EKG: Rhythm NSR,Vent. 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 -She is having bilateral hand/forearm mild edema, I have started her to keep her arms elevated, will observe her overnight, and keep her on IV steroids, Benadryl and Pepcid.  History of Hodgkin's lymphoma -This appears to be in remission, she is following with Dr. Irene Limbo  Iron deficiency anemia -Oncocytic, recent work-up as an outpatient significant for low ferritin and iron level, treated by IV iron, will defer further management to her oncologist  HCG pending at time of admission, but patient reports she is on contraception and she denies any chance of pregnancy.   DVT Prophylaxis He Lovenox   AM Labs Ordered, also please review Full Orders  Family Communication: Admission, patients condition and plan of care including tests being ordered have been discussed with the patient and mother at bedside who indicate understanding and agree with the plan and Code Status.  Code Status Full  Likely DC to  Home  Condition GUARDED    Consults called: D/W oncology Dr Gaspar Cola by phone    Admission status: Observation  Time spent in minutes : 50 minutes   Phillips Climes M.D on 12/20/2020 at 9:50 PM   [facility] - Office  [phone]

## 2020-12-21 ENCOUNTER — Telehealth: Payer: Self-pay | Admitting: Hematology

## 2020-12-21 DIAGNOSIS — C8108 Nodular lymphocyte predominant Hodgkin lymphoma, lymph nodes of multiple sites: Secondary | ICD-10-CM | POA: Diagnosis not present

## 2020-12-21 DIAGNOSIS — T7840XA Allergy, unspecified, initial encounter: Secondary | ICD-10-CM | POA: Diagnosis not present

## 2020-12-21 LAB — BASIC METABOLIC PANEL
Anion gap: 6 (ref 5–15)
BUN: 9 mg/dL (ref 6–20)
CO2: 22 mmol/L (ref 22–32)
Calcium: 8.9 mg/dL (ref 8.9–10.3)
Chloride: 110 mmol/L (ref 98–111)
Creatinine, Ser: 0.83 mg/dL (ref 0.44–1.00)
GFR, Estimated: >60 mL/min (ref >60)
Glucose, Bld: 174 mg/dL — ABNORMAL HIGH (ref 70–99)
Potassium: 4.1 mmol/L (ref 3.5–5.1)
Sodium: 138 mmol/L (ref 135–145)

## 2020-12-21 LAB — CBC
HCT: 34.9 % — ABNORMAL LOW (ref 36.0–46.0)
Hemoglobin: 9.9 g/dL — ABNORMAL LOW (ref 12.0–15.0)
MCH: 19.7 pg — ABNORMAL LOW (ref 26.0–34.0)
MCHC: 28.4 g/dL — ABNORMAL LOW (ref 30.0–36.0)
MCV: 69.5 fL — ABNORMAL LOW (ref 80.0–100.0)
Platelets: 214 10*3/uL (ref 150–400)
RBC: 5.02 MIL/uL (ref 3.87–5.11)
RDW: 20 % — ABNORMAL HIGH (ref 11.5–15.5)
WBC: 6.7 10*3/uL (ref 4.0–10.5)
nRBC: 0 % (ref 0.0–0.2)

## 2020-12-21 LAB — SARS CORONAVIRUS 2 (TAT 6-24 HRS): SARS Coronavirus 2: NEGATIVE

## 2020-12-21 LAB — HIV ANTIBODY (ROUTINE TESTING W REFLEX): HIV Screen 4th Generation wRfx: NONREACTIVE

## 2020-12-21 MED ORDER — ONDANSETRON HCL 4 MG PO TABS: 4.0000 mg | ORAL_TABLET | Freq: Four times a day (QID) | ORAL | 0 refills | Status: DC | PRN

## 2020-12-21 NOTE — Discharge Summary (Signed)
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 female, with past medical history of Hodgkin's lymphoma, s/p rituximab, in remission, iron deficiency anemia, she presents to ED secondary to nausea, vomiting, weakness, and bilateral upper extremity swelling, tingling and numbness, status post allergic reaction to iron transfusion today, patient went for iron transfusion today in the infusion center, where she did develop infusion reaction, they switched line from left arm to right arm, cording to infusion center note, patient received Tylenol and Claritin as premedication only, and she did not receive steroids as premedications, did receive Benadryl and steroids for her reaction, and did report hives, abdominal cramps, but she did deny any feeling of throat closing, shortness of breath, or increased oral secretions, ports she did feel weak, she did have a vomiting, reports she might felt she syncopized for a second or 2, did not fall or hit her head, denies any chances of pregnancy, reports she is using contraception, currently denies any focal deficits, vision changes, headache, she reports feeling her hands puffed up, where it feels  tingling and numbness with some decreased sensation, but she denies any pain. -in ED patient with some bilateral upper extremity swelling, but she has good pulses, [facility]ist consulted to admit for observation   The patient was subsequently admitted for acute allergic to iron infusion -Patient was treated with IV fluids, IV Benadryl, IV Protonix, and IV steroids He responded well, remained stable, all symptoms has resolved Patient is cleared for discharge home Instructed to avoid iron infusion, on iron supplements at this time.   Discharge Instructions:   Discharge Instructions     Activity as tolerated - No restrictions   Complete by: As directed    Call MD for:  difficulty breathing, headache or visual disturbances   Complete by: As directed    Call MD for:  extreme fatigue   Complete by: As directed    Call MD for:  hives   Complete by: As directed    Call MD for:  persistant dizziness or light-headedness   Complete by: As directed    Call MD for:  persistant nausea and vomiting   Complete by: As directed    Call MD for:  redness, tenderness, or signs of infection (pain, swelling, redness, odor or green/yellow discharge around incision site)   Complete by: As directed    Call MD for:  severe uncontrolled pain   Complete by: As directed    Call MD for:  temperature >100.4   Complete by: As directed    Diet - low sodium heart healthy   Complete by: As directed    Discharge instructions   Complete by: As directed    Follow with PCP, avoid all iron products till reevaluated by PCP   Increase activity slowly   Complete by: As directed         Medication List     STOP taking these medications    iron polysaccharides 150 MG capsule Commonly known as: NIFEREX       TAKE these medications    ondansetron 4 MG tablet Commonly known as: ZOFRAN Take 1 tablet (4 mg total) by mouth every 6 (six) hours as needed for nausea.   Zafemy 150-35 MCG/24HR transdermal  patch Generic drug: norelgestromin-ethinyl estradiol 1 patch once a week.        Allergies  Allergen Reactions   Fruit & Vegetable Daily [Nutritional Supplements] Swelling    Pineapple, plum,    Peanut-Containing Drug Products Swelling   Iron Sucrose Hives     Procedures /Studies:   No results found.  Subjective:   Patient was seen and examined 12/21/2020, 7:43 AM Patient stable today. No acute distress.  No issues overnight Stable for discharge.  Discharge Exam:    Vitals:   12/20/20 1830 12/20/20 2305 12/20/20 2305 12/21/20 0457  BP: 110/77  113/79 116/73  Pulse: 80  63 79  Resp: 16  18 16   Temp:   98.5 F (36.9 C) 98.3 F (36.8 C)  TempSrc:   Oral Oral  SpO2: 99%  100% 100%  Weight:  45.4 kg    Height:  5\' 3"  (1.6 m)      General: Pt lying comfortably in bed & appears in no obvious distress. Cardiovascular: S1 & S2 heard, RRR, S1/S2 +. No murmurs, rubs, gallops or clicks. No JVD or pedal edema. Respiratory: Clear to auscultation without wheezing, rhonchi or crackles. No increased work of breathing. Abdominal:  Non-distended, non-tender & soft. No organomegaly or masses appreciated. Normal bowel sounds heard. CNS: Alert and oriented. No focal deficits. Extremities: no edema, no cyanosis      The results of significant diagnostics from this hospitalization (including imaging, microbiology, ancillary and laboratory) are listed below for reference.      Microbiology:   No results found for this or any previous visit (from the past 240 hour(s)).   Labs:   CBC: Recent Labs  Lab 12/20/20 1826 12/21/20 0416  WBC 9.5 6.7  NEUTROABS 9.0*  --   HGB 11.1* 9.9*  HCT 38.3 34.9*  MCV 68.0* 69.5*  PLT 232 540   Basic Metabolic Panel: Recent Labs  Lab 12/20/20 1826 12/21/20 0416  NA 137 138  K 3.9 4.1  CL 109 110  CO2 21* 22  GLUCOSE 144* 174*  BUN 10 9  CREATININE 0.87 0.83  CALCIUM 8.9 8.9  MG 1.9  --   PHOS 2.8  --    Liver Function  Tests: Recent Labs  Lab 12/20/20 1826  AST 20  ALT 13  ALKPHOS 52  BILITOT 0.5  PROT 6.0*  ALBUMIN 3.4*   BNP (last 3 results) No results for input(s): BNP in the last 8760 hours. Cardiac Enzymes: No results for input(s): CKTOTAL, CKMB, CKMBINDEX, TROPONINI in the last 168 hours. CBG: No results for input(s): GLUCAP in the last 168 hours. Hgb A1c No results for input(s): HGBA1C in the last 72 hours. Lipid Profile No results for input(s): CHOL, HDL,  LDLCALC, TRIG, CHOLHDL, LDLDIRECT in the last 72 hours. Thyroid function studies No results for input(s): TSH, T4TOTAL, T3FREE, THYROIDAB in the last 72 hours.  Invalid input(s): FREET3 Anemia work up No results for input(s): VITAMINB12, FOLATE, FERRITIN, TIBC, IRON, RETICCTPCT in the last 72 hours. Urinalysis    Component Value Date/Time   COLORURINE YELLOW 06/03/2019 1110   APPEARANCEUR HAZY (A) 06/03/2019 1110   LABSPEC 1.012 06/03/2019 1110   PHURINE 5.0 06/03/2019 1110   GLUCOSEU NEGATIVE 06/03/2019 1110   HGBUR NEGATIVE 06/03/2019 1110   BILIRUBINUR NEGATIVE 06/03/2019 1110   BILIRUBINUR negative 10/05/2018 1331   KETONESUR NEGATIVE 06/03/2019 1110   PROTEINUR NEGATIVE 06/03/2019 1110   UROBILINOGEN 0.2 10/05/2018 1331   NITRITE NEGATIVE 06/03/2019 1110   LEUKOCYTESUR NEGATIVE 06/03/2019 1110         Time coordinating discharge: Over 45 minutes  SIGNED: Deatra James, MD, FACP, FHM. [facility],  Please use [email or web address] to Page If 7PM-7AM, please contact night-coverage [email or web address] 12/21/2020, 7:43 AM

## 2020-12-21 NOTE — Telephone Encounter (Signed)
Sch per 6/23 sch msg, left message

## 2020-12-22 ENCOUNTER — Telehealth: Payer: Self-pay

## 2020-12-22 NOTE — Telephone Encounter (Signed)
Called and left a message asking her to call the office back. 

## 2020-12-22 NOTE — Telephone Encounter (Signed)
-----   Message from Heath Lark, MD sent at 12/22/2020 12:10 PM EDT ----- Can you call and check on her today? She had IV iron infusion reaction on wednesday

## 2020-12-22 NOTE — Telephone Encounter (Signed)
She called back. Given below message. She is good today. No complaints except some edema to the top of her hand, which is improving everyday. Ask her to call the office if needed. She verbalized understanding.

## 2020-12-25 ENCOUNTER — Telehealth: Payer: Self-pay | Admitting: Hematology

## 2020-12-25 NOTE — Telephone Encounter (Signed)
Left message with changed MD appointment due to provider not in office.

## 2020-12-26 ENCOUNTER — Inpatient Hospital Stay: Payer: Medicaid Other

## 2020-12-26 ENCOUNTER — Telehealth: Payer: Self-pay | Admitting: *Deleted

## 2020-12-26 ENCOUNTER — Inpatient Hospital Stay (HOSPITAL_BASED_OUTPATIENT_CLINIC_OR_DEPARTMENT_OTHER): Payer: Medicaid Other | Admitting: Physician Assistant

## 2020-12-26 ENCOUNTER — Ambulatory Visit: Payer: Medicaid Other | Admitting: Hematology

## 2020-12-26 ENCOUNTER — Other Ambulatory Visit: Payer: Self-pay

## 2020-12-26 VITALS — BP 108/65 | HR 72 | Temp 97.9°F | Resp 16 | Ht 63.0 in | Wt 99.5 lb

## 2020-12-26 DIAGNOSIS — Z8571 Personal history of Hodgkin lymphoma: Secondary | ICD-10-CM | POA: Diagnosis present

## 2020-12-26 DIAGNOSIS — D500 Iron deficiency anemia secondary to blood loss (chronic): Secondary | ICD-10-CM | POA: Diagnosis not present

## 2020-12-26 DIAGNOSIS — R630 Anorexia: Secondary | ICD-10-CM | POA: Diagnosis not present

## 2020-12-26 DIAGNOSIS — N920 Excessive and frequent menstruation with regular cycle: Secondary | ICD-10-CM | POA: Diagnosis not present

## 2020-12-26 LAB — FERRITIN: Ferritin: 88 ng/mL (ref 11–307)

## 2020-12-26 LAB — RETIC PANEL
Immature Retic Fract: 31.7 % — ABNORMAL HIGH (ref 2.3–15.9)
RBC.: 4.42 MIL/uL (ref 3.87–5.11)
Retic Count, Absolute: 153.4 10*3/uL (ref 19.0–186.0)
Retic Ct Pct: 3.5 % — ABNORMAL HIGH (ref 0.4–3.1)
Reticulocyte Hemoglobin: 32 pg (ref >27.9)

## 2020-12-26 LAB — CBC WITH DIFFERENTIAL (CANCER CENTER ONLY)
Abs Immature Granulocytes: 0.01 10*3/uL (ref 0.00–0.07)
Basophils Absolute: 0 10*3/uL (ref 0.0–0.1)
Basophils Relative: 1 %
Eosinophils Absolute: 0.1 10*3/uL (ref 0.0–0.5)
Eosinophils Relative: 3 %
HCT: 31.1 % — ABNORMAL LOW (ref 36.0–46.0)
Hemoglobin: 9.2 g/dL — ABNORMAL LOW (ref 12.0–15.0)
Immature Granulocytes: 0 %
Lymphocytes Relative: 37 %
Lymphs Abs: 1.6 10*3/uL (ref 0.7–4.0)
MCH: 20.4 pg — ABNORMAL LOW (ref 26.0–34.0)
MCHC: 29.6 g/dL — ABNORMAL LOW (ref 30.0–36.0)
MCV: 69 fL — ABNORMAL LOW (ref 80.0–100.0)
Monocytes Absolute: 0.3 10*3/uL (ref 0.1–1.0)
Monocytes Relative: 6 %
Neutro Abs: 2.3 10*3/uL (ref 1.7–7.7)
Neutrophils Relative %: 53 %
Platelet Count: 204 10*3/uL (ref 150–400)
RBC: 4.51 MIL/uL (ref 3.87–5.11)
RDW: 22.5 % — ABNORMAL HIGH (ref 11.5–15.5)
WBC Count: 4.3 10*3/uL (ref 4.0–10.5)
nRBC: 0 % (ref 0.0–0.2)

## 2020-12-26 LAB — IRON AND TIBC
Iron: 30 ug/dL — ABNORMAL LOW (ref 41–142)
Saturation Ratios: 7 % — ABNORMAL LOW (ref 21–57)
TIBC: 459 ug/dL — ABNORMAL HIGH (ref 236–444)
UIBC: 429 ug/dL — ABNORMAL HIGH (ref 120–384)

## 2020-12-26 NOTE — Telephone Encounter (Signed)
Patient called to request cancellation of Iron infusion today.  Still wants to see Doctor.  States she had a severe reaction and was told to not ever get Iron infusions again by another doctor.    Notified Murray Hodgkins as she will be seeing patient today.

## 2020-12-26 NOTE — Progress Notes (Signed)
HEMATOLOGY/ONCOLOGY CLINIC NOTE  Date of Service: 12/28/2020  Patient Care Team: [name], PA as PCP - General (Family Medicine)   CHIEF COMPLAINTS: -Lymphocyte predominant hodgkins lymphoma -Iron deficiency anemia  INTERVAL HISTORY:  Leah Gould is here today for follow up for iron deficiency anemia. The patient's last visit with [place] was on 11/22/2020. Since the last visit, patient received IV venofer injection on 12/20/2020 but developed infusion reaction that required evaluation in the emergency room. Symptoms included nausea, vomiting, weakness, and bilateral upper extremity swelling, tingling and numbness. She was treated with IV fluids, IV benadryl, IV protonix, and IV steroids with improvement of symptoms.    On exam today, Leah Gould reports that she is fatigued but can complete her ADLs independently. She reports decreased appetite with early satiety. She denies any recent weight loss. Patient denies any nausea or vomiting. Patient notes intermittent episodes of LLQ over the last week. She denies any triggers for the pain and it resolves on its own. Patient has regular bowel movements without diarrhea or constipation. She denies easy bruising or signs of bleeding. Patient continues on Zafemy transdermal patch with noticeable improvement of her menstrual bleeding. Her last day of her last menstrual cycle was two weeks ago. Her cycle lasts five days without any heavy bleeding.She denies any fevers, chills, night sweats, shortness of breath, chest pain or cough.    MEDICAL HISTORY:  Past Medical History:  Diagnosis Date   Anemia    Asthma    no meds, allergy induced   Hodgkin's lymphoma ([place])    Inguinal adenopathy 12/22/2018    SURGICAL HISTORY: Past Surgical History:  Procedure Laterality Date   MASS EXCISION Left 12/22/2018   Procedure: EXCISION DEEP LEFT INGUINAL LYMPH NODE;  Surgeon: Fanny Skates, MD;  Location: [facility];  Service:  General;  Laterality: Left;    SOCIAL HISTORY: Social History   Socioeconomic History   Marital status: Single    Spouse name: Not on file   Number of children: Not on file   Years of education: Not on file   Highest education level: Not on file  Occupational History   Not on file  Tobacco Use   Smoking status: Never   Smokeless tobacco: Never  Vaping Use   Vaping Use: Never used  Substance and Sexual Activity   Alcohol use: No   Drug use: No   Sexual activity: Yes    Birth control/protection: None  Other Topics Concern   Not on file  Social History Narrative   Not on file   Social Determinants of Health   Financial Resource Strain: Not on file  Food Insecurity: Not on file  Transportation Needs: Not on file  Physical Activity: Not on file  Stress: Not on file  Social Connections: Not on file  Intimate Partner Violence: Not on file    FAMILY HISTORY: No family history on file.  ALLERGIES:  is allergic to fruit & vegetable daily [nutritional supplements], peanut-containing drug products, and iron sucrose.  MEDICATIONS:  Current Outpatient Medications  Medication Sig Dispense Refill   ZAFEMY 150-35 MCG/24HR transdermal patch 1 patch once a week.     ondansetron (ZOFRAN) 4 MG tablet Take 1 tablet (4 mg total) by mouth every 6 (six) hours as needed for nausea. (Patient not taking: Reported on 12/26/2020) 20 tablet 0   No current facility-administered medications for this visit.    REVIEW OF SYSTEMS:   Review of Systems  Constitutional:  Negative for chills, fever, malaise/fatigue and weight loss.  Respiratory:  Negative for cough and shortness of breath.   Cardiovascular:  Negative for chest pain and leg swelling.  Gastrointestinal:  Positive for abdominal pain (LLQ pain, intermittently). Negative for blood in stool, constipation, diarrhea, melena, nausea and vomiting.  Skin:  Negative for rash.  Neurological:  Negative for dizziness, tingling, tremors and  headaches.  Endo/Heme/Allergies:  Does not bruise/bleed easily.    PHYSICAL EXAMINATION: ECOG PERFORMANCE STATUS: 0 - Asymptomatic  Vitals:   12/26/20 1357  BP: 108/65  Pulse: 72  Resp: 16  Temp: 97.9 F (36.6 C)  SpO2: 100%    Filed Weights   12/26/20 1357  Weight: 99 lb 8 oz (45.1 kg)    Body mass index is 17.63 kg/m.    GENERAL:alert, in no acute distress and comfortable SKIN: no acute rashes, no significant lesions EYES: conjunctiva are pink and non-injected, sclera anicteric OROPHARYNX: MMM, no exudates, no oropharyngeal erythema or ulceration NECK: supple, no JVD LYMPH:  no palpable lymphadenopathy in the cervical, axillary or inguinal regions LUNGS: clear to auscultation b/l with normal respiratory effort HEART: regular rate & rhythm ABDOMEN:  normoactive bowel sounds , non tender, not distended. Extremity: no pedal edema PSYCH: alert & oriented x 3 with fluent speech NEURO: no focal motor/sensory deficits    LABORATORY DATA:  I have reviewed the data as listed  CBC Latest Ref Rng & Units 12/26/2020 12/21/2020 12/20/2020  WBC 4.0 - 10.5 K/uL 4.3 6.7 9.5  Hemoglobin 12.0 - 15.0 g/dL 9.2(L) 9.9(L) 11.1(L)  Hematocrit 36.0 - 46.0 % 31.1(L) 34.9(L) 38.3  Platelets 150 - 400 K/uL 204 214 232   CMP Latest Ref Rng & Units 12/21/2020 12/20/2020 11/22/2020  Glucose 70 - 99 mg/dL 174(H) 144(H) 85  BUN 6 - 20 mg/dL 9 10 8   Creatinine 0.44 - 1.00 mg/dL 0.83 0.87 0.76  Sodium 135 - 145 mmol/L 138 137 141  Potassium 3.5 - 5.1 mmol/L 4.1 3.9 3.7  Chloride 98 - 111 mmol/L 110 109 105  CO2 22 - 32 mmol/L 22 21(L) 25  Calcium 8.9 - 10.3 mg/dL 8.9 8.9 9.4  Total Protein 6.5 - 8.1 g/dL - 6.0(L) 7.0  Total Bilirubin 0.3 - 1.2 mg/dL - 0.5 0.3  Alkaline Phos 38 - 126 U/L - 52 64  AST 15 - 41 U/L - 20 17  ALT 0 - 44 U/L - 13 11   . Lab Results  Component Value Date   IRON 30 (L) 12/26/2020   TIBC 459 (H) 12/26/2020   IRONPCTSAT 7 (L) 12/26/2020   (Iron and  TIBC)  Lab Results  Component Value Date   FERRITIN 88 12/26/2020    RADIOGRAPHIC STUDIES: No images were reviewed during this visit.  ASSESSMENT & PLAN:   1. 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 The sample was sent for a second opinion and is awaiting further characterization for confirmation. -11/17/18 CT A/P which revealed "Enlarged presumed lymph node in the left inguinal region measuring 5.2 x 3.9 x 3.8 cm. There is enhancement of this lymph node. Etiology for this localized lymph node prominence is uncertain. Tissue sampling may well be warranted given this finding. No other adenopathy noted in the abdomen or pelvis. 2.  Spleen does not appear appreciably enlarged. 3. Wall thickening  in the urinary bladder. Suspect a degree of cystitis. No renal or ureteral calculus. No hydronephrosis. 4. Apparent recent ovarian cyst rupture on the right with mild fluid tracking from the right adnexa and cul-de-sac. 5. No evident bowel obstruction or bowel wall thickening. No abscess in the abdomen pelvis. No periappendiceal region inflammatory change." -01/11/2019 PET scan revealed "Bilateral axillary and right subpectoral adenopathy noted with Deauville 5 disease on the left and Deauville 4 disease on the right in this region. Diffuse uniform endometrial activity with SUV of 13.2. Although this is very high for physiologic endometrial activity, the diffuse uniform distribution is more characteristic of benign physiologic endometrial activity rather than malignancy. If the patient has abnormal uterine bleeding or if otherwise clinically warranted, pelvic sonography could be utilized for further characterization of the endometrium. Postoperative findings and low-grade metabolic  activity in the vicinity of the excision biopsy of the left inguinal lymph node. No other hypermetabolic adenopathy in the abdomen/pelvis identified" -01/01/2019 biopsy sampling ([id number]) from the [facility] shows lymphocyte predominant Hodgkin's lymphoma. -04/27/2019 PET/CT scan ([id number]) which revealed "1. Significant response to therapy of lymphoma. Residual small bilateral axillary nodes with low-level hypermetabolism. (Deauville) 2. 2. 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 3. Right ovarian hypermetabolism is likely physiologic and may represent underlying corpus luteal cyst." --No lab or clinical evidence of NLPHL recurrence at this time. Will continue watchful observation.   2. Iron deficiency anemia 2/2 to menstrual bleeding -Currently on Zafemy transdermal patch with improvement of menstrual bleeding.  -Patient received IV venofer x 1 dose on 12/20/2020 but developed infusion reaction that required IV fluids, benadryl, steroids and ED evaluation -Labs from today show stable hemoglobin of 9.2 with improvement of iron levels (ferritin 88, iron saturation 7%). -Patient has declined future IV iron infusions at this time although we would plan to adminster a different iron formulary.  -She has agreed to take iron-polysaccharide once daily and increase iron rich foods in her diet.  -Recommend for patient to return in 4 weeks with repeat labs.   3) Appetite loss: -Advised to supplement diet with protein shakes, eat small frequent meals  -Recommend to follow up with PCP for referral for dietician -Weight is stable.    All of the patient's questions were answered with apparent satisfaction. The patient knows to call the clinic with any problems, questions or concerns.    I have spent a total of 25 minutes minutes of face-to-face and non-face-to-face time, preparing to see the patient, obtaining and/or reviewing separately obtained  history, performing a medically appropriate examination, counseling and educating the patient, documenting clinical information in the electronic health record,  and care coordination.   Lincoln Brigham PA-C [facility] [place]

## 2020-12-27 ENCOUNTER — Ambulatory Visit (HOSPITAL_COMMUNITY): Payer: Medicaid Other

## 2020-12-28 ENCOUNTER — Encounter (HOSPITAL_COMMUNITY): Payer: Self-pay | Admitting: Hematology and Oncology

## 2021-01-02 ENCOUNTER — Ambulatory Visit (HOSPITAL_COMMUNITY): Payer: Medicaid Other

## 2021-01-22 ENCOUNTER — Other Ambulatory Visit: Payer: Self-pay

## 2021-01-22 DIAGNOSIS — D500 Iron deficiency anemia secondary to blood loss (chronic): Secondary | ICD-10-CM

## 2021-01-23 ENCOUNTER — Inpatient Hospital Stay: Payer: Medicaid Other

## 2021-01-23 ENCOUNTER — Inpatient Hospital Stay: Payer: Medicaid Other | Admitting: Hematology

## 2021-02-09 ENCOUNTER — Telehealth: Payer: Self-pay | Admitting: Hematology

## 2021-02-09 NOTE — Telephone Encounter (Signed)
Left message with rescheduled upcoming appointment due to provider's emergency. 

## 2021-02-14 ENCOUNTER — Other Ambulatory Visit: Payer: Medicaid Other

## 2021-02-14 ENCOUNTER — Ambulatory Visit: Payer: Medicaid Other | Admitting: Hematology

## 2021-02-15 ENCOUNTER — Inpatient Hospital Stay (HOSPITAL_BASED_OUTPATIENT_CLINIC_OR_DEPARTMENT_OTHER): Payer: Medicaid Other | Admitting: Hematology

## 2021-02-15 ENCOUNTER — Inpatient Hospital Stay: Payer: Medicaid Other | Attending: Hematology

## 2021-02-15 ENCOUNTER — Other Ambulatory Visit: Payer: Self-pay

## 2021-02-15 VITALS — BP 103/65 | HR 87 | Temp 98.7°F | Resp 18 | Ht 63.0 in | Wt 100.3 lb

## 2021-02-15 DIAGNOSIS — N920 Excessive and frequent menstruation with regular cycle: Secondary | ICD-10-CM | POA: Diagnosis not present

## 2021-02-15 DIAGNOSIS — D500 Iron deficiency anemia secondary to blood loss (chronic): Secondary | ICD-10-CM | POA: Diagnosis not present

## 2021-02-15 DIAGNOSIS — C8108 Nodular lymphocyte predominant Hodgkin lymphoma, lymph nodes of multiple sites: Secondary | ICD-10-CM

## 2021-02-15 DIAGNOSIS — Z8571 Personal history of Hodgkin lymphoma: Secondary | ICD-10-CM | POA: Insufficient documentation

## 2021-02-15 LAB — CBC WITH DIFFERENTIAL (CANCER CENTER ONLY)
Abs Immature Granulocytes: 0 10*3/uL (ref 0.00–0.07)
Basophils Absolute: 0.1 10*3/uL (ref 0.0–0.1)
Basophils Relative: 1 %
Eosinophils Absolute: 0.1 10*3/uL (ref 0.0–0.5)
Eosinophils Relative: 3 %
HCT: 34.5 % — ABNORMAL LOW (ref 36.0–46.0)
Hemoglobin: 10.7 g/dL — ABNORMAL LOW (ref 12.0–15.0)
Immature Granulocytes: 0 %
Lymphocytes Relative: 36 %
Lymphs Abs: 1.8 10*3/uL (ref 0.7–4.0)
MCH: 22.9 pg — ABNORMAL LOW (ref 26.0–34.0)
MCHC: 31 g/dL (ref 30.0–36.0)
MCV: 73.9 fL — ABNORMAL LOW (ref 80.0–100.0)
Monocytes Absolute: 0.4 10*3/uL (ref 0.1–1.0)
Monocytes Relative: 9 %
Neutro Abs: 2.5 10*3/uL (ref 1.7–7.7)
Neutrophils Relative %: 51 %
Platelet Count: 269 10*3/uL (ref 150–400)
RBC: 4.67 MIL/uL (ref 3.87–5.11)
RDW: 22.4 % — ABNORMAL HIGH (ref 11.5–15.5)
WBC Count: 4.9 10*3/uL (ref 4.0–10.5)
nRBC: 0 % (ref 0.0–0.2)

## 2021-02-15 LAB — CMP (CANCER CENTER ONLY)
ALT: 10 U/L (ref 0–44)
AST: 15 U/L (ref 15–41)
Albumin: 3.5 g/dL (ref 3.5–5.0)
Alkaline Phosphatase: 57 U/L (ref 38–126)
Anion gap: 8 (ref 5–15)
BUN: 9 mg/dL (ref 6–20)
CO2: 25 mmol/L (ref 22–32)
Calcium: 9 mg/dL (ref 8.9–10.3)
Chloride: 106 mmol/L (ref 98–111)
Creatinine: 0.85 mg/dL (ref 0.44–1.00)
GFR, Estimated: >60 mL/min (ref >60)
Glucose, Bld: 83 mg/dL (ref 70–99)
Potassium: 3.9 mmol/L (ref 3.5–5.1)
Sodium: 139 mmol/L (ref 135–145)
Total Bilirubin: 0.4 mg/dL (ref 0.3–1.2)
Total Protein: 6.6 g/dL (ref 6.5–8.1)

## 2021-02-15 LAB — FERRITIN: Ferritin: 5 ng/mL — ABNORMAL LOW (ref 11–307)

## 2021-02-15 LAB — IRON AND TIBC
Iron: 42 ug/dL (ref 41–142)
Saturation Ratios: 8 % — ABNORMAL LOW (ref 21–57)
TIBC: 516 ug/dL — ABNORMAL HIGH (ref 236–444)
UIBC: 474 ug/dL — ABNORMAL HIGH (ref 120–384)

## 2021-02-15 LAB — LACTATE DEHYDROGENASE: LDH: 137 U/L (ref 98–192)

## 2021-02-15 MED ORDER — POLYSACCHARIDE IRON COMPLEX 150 MG PO CAPS: 150.0000 mg | ORAL_CAPSULE | Freq: Two times a day (BID) | ORAL | 5 refills | Status: DC

## 2021-02-16 ENCOUNTER — Telehealth: Payer: Self-pay | Admitting: Hematology

## 2021-02-16 NOTE — Telephone Encounter (Signed)
Scheduled follow-up appointments per 8/18 los. Patient is aware. 

## 2021-02-21 ENCOUNTER — Encounter (HOSPITAL_COMMUNITY): Payer: Self-pay | Admitting: Hematology and Oncology

## 2021-02-21 NOTE — Progress Notes (Signed)
HEMATOLOGY/ONCOLOGY CLINIC NOTE  Date of Service: .02/15/2021   Patient Care Team: [name], PA as PCP - General (Family Medicine)   CHIEF COMPLAINTS: -nodular Lymphocyte predominant hodgkins lymphoma -Iron deficiency anemia  INTERVAL HISTORY:  Leah Gould is here today for follow up for iron deficiency anemia and nodular lymphocyte predominant hodgkins lymphoma. The patient's last visit with [place] was on 11/2020. She notes she has been taking her po iron replacement. Menstrual bleeding controlled.  Labs show improvement in her hgb from 9.2 to 10.7, ferritin still low at 5 with 8% iron saturation. Reactive thrombocytosis.  MEDICAL HISTORY:  Past Medical History:  Diagnosis Date   Anemia    Asthma    no meds, allergy induced   Hodgkin's lymphoma ([place])    Inguinal adenopathy 12/22/2018    SURGICAL HISTORY: Past Surgical History:  Procedure Laterality Date   MASS EXCISION Left 12/22/2018   Procedure: EXCISION DEEP LEFT INGUINAL LYMPH NODE;  Surgeon: Fanny Skates, MD;  Location: [facility];  Service: General;  Laterality: Left;    SOCIAL HISTORY: Social History   Socioeconomic History   Marital status: Single    Spouse name: Not on file   Number of children: Not on file   Years of education: Not on file   Highest education level: Not on file  Occupational History   Not on file  Tobacco Use   Smoking status: Never   Smokeless tobacco: Never  Vaping Use   Vaping Use: Never used  Substance and Sexual Activity   Alcohol use: No   Drug use: No   Sexual activity: Yes    Birth control/protection: None  Other Topics Concern   Not on file  Social History Narrative   Not on file   Social Determinants of Health   Financial Resource Strain: Not on file  Food Insecurity: Not on file  Transportation Needs: Not on file  Physical Activity: Not on file  Stress: Not on file  Social Connections: Not on file  Intimate Partner Violence: Not  on file    FAMILY HISTORY: No family history on file.  ALLERGIES:  is allergic to fruit & vegetable daily [nutritional supplements], peanut-containing drug products, and iron sucrose.  MEDICATIONS:  Current Outpatient Medications  Medication Sig Dispense Refill   iron polysaccharides (NIFEREX) 150 MG capsule Take 1 capsule (150 mg total) by mouth 2 (two) times daily. 60 capsule 5   ZAFEMY 150-35 MCG/24HR transdermal patch 1 patch once a week.     ondansetron (ZOFRAN) 4 MG tablet Take 1 tablet (4 mg total) by mouth every 6 (six) hours as needed for nausea. (Patient not taking: Reported on 02/15/2021) 20 tablet 0   No current facility-administered medications for this visit.    REVIEW OF SYSTEMS:   .10 Point review of Systems was done is negative except as noted above.  PHYSICAL EXAMINATION: ECOG PERFORMANCE STATUS: 0 - Asymptomatic  Vitals:   02/15/21 1420  BP: 103/65  Pulse: 87  Resp: 18  Temp: 98.7 F (37.1 C)  SpO2: 100%    Filed Weights   02/15/21 1420  Weight: 100 lb 4.8 oz (45.5 kg)    Body mass index is 17.77 kg/m.  NAD . GENERAL:alert, in no acute distress and comfortable SKIN: no acute rashes, no significant lesions EYES: conjunctiva are pink and non-injected, sclera anicteric OROPHARYNX: MMM, no exudates, no oropharyngeal erythema or ulceration NECK: supple, no JVD LYMPH:  no palpable lymphadenopathy in the cervical, axillary  or inguinal regions LUNGS: clear to auscultation b/l with normal respiratory effort HEART: regular rate & rhythm ABDOMEN:  normoactive bowel sounds , non tender, not distended. Extremity: no pedal edema PSYCH: alert & oriented x 3 with fluent speech NEURO: no focal motor/sensory deficits  LABORATORY DATA:  I have reviewed the data as listed  CBC Latest Ref Rng & Units 02/15/2021 12/26/2020 12/21/2020  WBC 4.0 - 10.5 K/uL 4.9 4.3 6.7  Hemoglobin 12.0 - 15.0 g/dL 10.7(L) 9.2(L) 9.9(L)  Hematocrit 36.0 - 46.0 % 34.5(L) 31.1(L)  34.9(L)  Platelets 150 - 400 K/uL 269 204 214   CMP Latest Ref Rng & Units 02/15/2021 12/21/2020 12/20/2020  Glucose 70 - 99 mg/dL 83 174(H) 144(H)  BUN 6 - 20 mg/dL '9 9 10  '$ Creatinine 0.44 - 1.00 mg/dL 0.85 0.83 0.87  Sodium 135 - 145 mmol/L 139 138 137  Potassium 3.5 - 5.1 mmol/L 3.9 4.1 3.9  Chloride 98 - 111 mmol/L 106 110 109  CO2 22 - 32 mmol/L 25 22 21(L)  Calcium 8.9 - 10.3 mg/dL 9.0 8.9 8.9  Total Protein 6.5 - 8.1 g/dL 6.6 - 6.0(L)  Total Bilirubin 0.3 - 1.2 mg/dL 0.4 - 0.5  Alkaline Phos 38 - 126 U/L 57 - 52  AST 15 - 41 U/L 15 - 20  ALT 0 - 44 U/L 10 - 13   . Lab Results  Component Value Date   IRON 42 02/15/2021   TIBC 516 (H) 02/15/2021   IRONPCTSAT 8 (L) 02/15/2021   (Iron and TIBC)  Lab Results  Component Value Date   FERRITIN 5 (L) 02/15/2021    RADIOGRAPHIC STUDIES: No images were reviewed during this visit.  ASSESSMENT & PLAN:   1. 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 The sample was sent for a second opinion and is awaiting further characterization for confirmation. -11/17/18 CT A/P which revealed "Enlarged presumed lymph node in the left inguinal region measuring 5.2 x 3.9 x 3.8 cm. There is enhancement of this lymph node. Etiology for this localized lymph node prominence is uncertain. Tissue sampling may well be warranted given this finding. No other adenopathy noted in the abdomen or pelvis. 2.  Spleen does not appear appreciably enlarged. 3. Wall thickening in the urinary bladder. Suspect a degree of cystitis. No renal or ureteral calculus. No hydronephrosis. 4. Apparent recent ovarian cyst rupture on the right with mild fluid tracking from the right adnexa and cul-de-sac. 5. No evident bowel obstruction or bowel wall  thickening. No abscess in the abdomen pelvis. No periappendiceal region inflammatory change." -01/11/2019 PET scan revealed "Bilateral axillary and right subpectoral adenopathy noted with Deauville 5 disease on the left and Deauville 4 disease on the right in this region. Diffuse uniform endometrial activity with SUV of 13.2. Although this is very high for physiologic endometrial activity, the diffuse uniform distribution is more characteristic of benign physiologic endometrial activity rather than malignancy. If the patient has abnormal uterine bleeding or if otherwise clinically warranted, pelvic sonography could be utilized for further characterization of the endometrium. Postoperative findings and low-grade metabolic activity in the vicinity of the excision biopsy of the left inguinal lymph node. No other hypermetabolic adenopathy in the abdomen/pelvis identified" -01/01/2019 biopsy sampling ([id number]) from the [place] [facility] shows lymphocyte predominant Hodgkin's lymphoma. -04/27/2019 PET/CT scan (  [id number]) which revealed "1. Significant response to therapy of lymphoma. Residual small bilateral axillary nodes with low-level hypermetabolism. (Deauville) 2. 2. 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 3. Right ovarian hypermetabolism is likely physiologic and may represent underlying corpus luteal cyst." --No lab or clinical evidence of NLPHL recurrence at this time. Will continue watchful observation.   2. Iron deficiency anemia 2/2 to menstrual bleeding -Currently on Zafemy transdermal patch with improvement of menstrual bleeding.  -Patient received IV venofer x 1 dose on 12/20/2020 but developed infusion reaction that required IV fluids, benadryl, steroids and ED evaluation PLAN -Labs from today show improved hemoglobin of 10.7  -ferritin still low at 5 -discussed consideration of alternative Iron preparation such as Injectafer vs continued po  iron replacement and patient prefers the later. -would recommend increasing Iron polysaccharide to '150mg'$  po BID a -continue Iron rich foods. -continue f/u with ObGyn for mx of heavy periods.  Phone visit with Dr Irene Limbo in 4 months Labs 1-2 days prior to phone visit  All of the patient's questions were answered with apparent satisfaction. The patient knows to call the clinic with any problems, questions or concerns. . The total time spent in the appointment was 20 minutes and more than 50% was on counseling and direct patient cares.   Marland KitchenMarland KitchenMarland KitchenBrunetta Genera MD

## 2021-02-23 ENCOUNTER — Telehealth: Payer: Self-pay

## 2021-02-23 NOTE — Telephone Encounter (Signed)
Contacted pt per Dr Irene Limbo to let pt  know her hgb is better at 10.7 but she is still very iron deficient . She should increase her Iron polysaccharide to '150mg'$  po BID with food.   Pt acknowledged and verbalized understanding.

## 2021-03-23 ENCOUNTER — Other Ambulatory Visit: Payer: Self-pay

## 2021-03-23 DIAGNOSIS — D500 Iron deficiency anemia secondary to blood loss (chronic): Secondary | ICD-10-CM

## 2021-03-25 NOTE — Progress Notes (Signed)
New Patient Note  RE: Leah Gould. Leah Gould MRN: [id number] DOB: 03/06/1996 Date of Office Visit: 03/26/2021  Consult requested by: Leah Huxley, PA Primary care provider: [name], PA  Chief Complaint: Establish Care (Patient in today to because she had a reaction to IV Iron infusion. She had numb arms, hives, dizziness, vomiting and she passed out. She was scheduled for 4 infusions but only got the 1 and she needs to know what she needs to do next.)  History of Present Illness: I had the pleasure of seeing Leah Gould for initial evaluation at the [facility] on 03/26/2021. She is a 25 y.o. female, who is referred here by Leah Huxley, PA for the evaluation of reaction to iron infusion.  Drug reaction: Reaction to iron infusion which occurred in June 2022.  Patient was being treated for iron deficiency anemia.  Patient had 4 iron infusions in 2020 with no issues then it was restarted in June 2022. Symptoms started after 30 min to 60 min after the infusions.  Symptoms include: she noted left sided pain where the infusion was so the infusion was stopped and infusion was started on the right side.   She noted right arm numbness, hand swelling b/l and noted tiny red bumps from the elbow to her hands b/l, itching on the arms, chest pain. She was given 561mL saline bolus, benadryl 25mg , solumedrol.   She was observed for about 1-2 hours and after being discharged - within 10 minutes in the car she had some dizziness, vomiting and passed out. She was taken to the ER at that time.  Symptoms improved slowly, the swelling took about 3 days to resolve completely. Treatment included: benadryl, solumedrol. Mucosal involvement: no.  Denies any fevers, chills, changes in medications, foods, personal care products or recent infections.  Previous history of rash/hives/swelling: no. Previous history of drug reactions: no.  Patient tolerates oral iron pills twice a day  with no issues. Patient is not interested in going back to the iron infusions.   Past medical history significant for Hodgkin's Lymphoma.   12/20/2020 hospitalization: "Leah Gould  is a 25 y.o. female, with past medical history of Hodgkin's lymphoma, s/p rituximab, in remission, iron deficiency anemia, she presents to ED secondary to nausea, vomiting, weakness, and bilateral upper extremity swelling, tingling and numbness, status post allergic reaction to iron transfusion today, patient went for iron transfusion today in the [facility], where she did develop infusion reaction, they switched line from left arm to right arm, cording to [facility] note, patient received Tylenol and Claritin as premedication only, and she did not receive steroids as premedications, did receive Benadryl and steroids for her reaction, and did report hives, abdominal cramps, but she did deny any feeling of throat closing, shortness of breath, or increased oral secretions, ports she did feel weak, she did have a vomiting, reports she might felt she syncopized for a second or 2, did not fall or hit her head, denies any chances of pregnancy, reports she is using contraception, currently denies any focal deficits, vision changes, headache, she reports feeling her hands puffed up, where it feels tingling and numbness with some decreased sensation, but she denies any pain. -in ED patient with some bilateral upper extremity swelling, but she has good pulses, [facility] hospitalist consulted to admit for observation  The patient was subsequently admitted for acute allergic to iron infusion -Patient was treated with IV fluids, IV Benadryl, IV Protonix,  and IV steroids He responded well, remained stable, all symptoms has resolved Patient is cleared for discharge home Instructed to avoid iron infusion, on iron supplements at this time."  02/15/2021 oncology visit: "2. Iron deficiency anemia 2/2 to menstrual bleeding -Currently  on Zafemy transdermal patch with improvement of menstrual bleeding.  -Patient received IV venofer x 1 dose on 12/20/2020 but developed infusion reaction that required IV fluids, benadryl, steroids and ED evaluation PLAN -Labs from today show improved hemoglobin of 10.7  -ferritin still low at 5 -discussed consideration of alternative Iron preparation such as Injectafer vs continued po iron replacement and patient prefers the later. -would recommend increasing Iron polysaccharide to 150mg  po BID a -continue Iron rich foods. -continue f/u with ObGyn for mx of heavy periods."  Assessment and Plan: Leah Gould is a 25 y.o. female with: Iron infusion reaction Patient has history of iron deficiency anemia and used to receive iron infusions in 2020 with no issues.  Received infusion 2022 with numbness, hand swelling, rash, itching, chest pain.  Treated with Benadryl and Solu-Medrol.  Questionable loss of consciousness with dizziness/vomiting leading to ER visit with observation.  Symptoms completely resolved after 3 days.  Currently tolerates oral iron replacement with no problems. Discussed with patient that there is no skin prick testing available for iron infusions. The fact that she tolerates oral iron pills with no issues makes the iron component less likely to be the culprit. If patient is requiring iron infusions in the future, then recommend that she be referred to allergy at an academic [facility] such as [facility], [facility] or [facility] for further evaluation. We don't offer IV medication drug challenges in our clinic.   Other allergic rhinitis Rhinoconjunctivitis symptoms in the spring.  Patient used to be on allergy injections with good benefit and interested in restarting if able. Today's skin testing was positive to grass, weed, trees, cat and dust mites. Start environmental control measures as below. Use over the counter antihistamines such as Zyrtec (cetirizine), Claritin (loratadine), Allegra (fexofenadine),  or Xyzal (levocetirizine) daily as needed. May take twice a day during allergy flares. May switch antihistamines every few months. Start allergy injections - will send in orders once patient schedules for first injection.  Had a detailed discussion with patient/family that clinical history is suggestive of allergic rhinitis, and may benefit from allergy immunotherapy (AIT). Discussed in detail regarding the dosing, schedule, side effects (mild to moderate local allergic reaction and rarely systemic allergic reactions including anaphylaxis), and benefits (significant improvement in nasal symptoms, seasonal flares of asthma) of immunotherapy with the patient. There is significant time commitment involved with allergy shots, which includes weekly immunotherapy injections for first 9-12 months and then biweekly to monthly injections for 3-5 years. Consent was signed. I have prescribed epinephrine injectable and demonstrated proper use. For mild symptoms you can take over the counter antihistamines such as Benadryl and monitor symptoms closely. If symptoms worsen or if you have severe symptoms including breathing issues, throat closure, significant swelling, whole body hives, severe diarrhea and vomiting, lightheadedness then inject epinephrine and seek immediate medical care afterwards.Emergency action plan given.  Allergic conjunctivitis of both eyes See assessment and plan as above.  History of asthma History of asthma but no symptoms/albuterol use for a few years. Today's spirometry showed some restriction. Monitor breathing symptoms. May use albuterol rescue inhaler 2 puffs every 4 to 6 hours as needed for shortness of breath, chest tightness, coughing, and wheezing. Monitor frequency of use.   Oral allergy syndrome, subsequent  encounter Perioral discomfort with plums, peanuts and soy. Today's skin testing showed: Borderline positive to soy. Negative to peanuts.  Continue to avoid that are  bothersome - plums, peanuts, soy. 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 This is caused by cross reactivity of pollen with fresh fruits and vegetables, and nuts. Symptoms are usually localized in the form of itching and burning in mouth and throat. Very rarely it can progress to more severe symptoms. Eating foods in cooked or processed forms usually minimizes symptoms. I recommended avoidance of eating the problem foods, especially during the peak season(s). Sometimes, OFAS can induce severe throat swelling or even a systemic reaction; with such instance, I advised them to report to a local ER. A list of common pollens and food cross-reactivities was provided to the patient.   Other atopic dermatitis Not controlled. See below for proper skin care. Use triamcinolone 0.1% ointment twice a day as needed for rash flares. Do not use on the face, neck, armpits or groin area. Do not use more than 3 weeks in a row.   Return in about 4 months (around 07/26/2021).  Meds ordered this encounter  Medications   albuterol (PROAIR HFA) 108 (90 Base) MCG/ACT inhaler    Sig: Inhale 2 puffs into the lungs every 4 (four) hours as needed for wheezing or shortness of breath (coughing fits).    Dispense:  18 g    Refill:  1   EPINEPHrine 0.3 mg/0.3 mL IJ SOAJ injection    Sig: Inject 0.3 mg into the muscle as needed for anaphylaxis.    Dispense:  1 each    Refill:  2    May dispense generic/Mylan/Teva brand.   triamcinolone ointment (KENALOG) 0.1 %    Sig: Apply 1 application topically 2 (two) times daily as needed (rash flare). Do not use on the face, neck, armpits or groin area. Do not use more than 3 weeks in a row.    Dispense:  30 g    Refill:  2    Lab Orders  No laboratory test(s) ordered today    Other allergy screening: Asthma: yes Used to have albuterol in the past but no symptoms for a few years. Rhino conjunctivitis:   Itchy eyes, rash in the spring. Patient was on allergy injections in the past with good benefit.  Last injection was in 2018. Last skin testing was before 2013. Interested in re-starting injections.   Food allergy: yes Plums, peanuts cause lip swelling. Soy causes throat itching.   Hymenoptera allergy: no Urticaria: no Eczema:yes History of recurrent infections suggestive of immunodeficency: no  Diagnostics: Spirometry:  Tracings reviewed. Her effort: It was hard to get consistent efforts and there is a question as to whether this reflects a maximal maneuver. FVC: 2.47L FEV1: 2.35L, 85% predicted FEV1/FVC ratio: 95% Interpretation: Spirometry consistent with possible restrictive disease.  Please see scanned spirometry results for details.  Skin Testing: Environmental allergy panel and select foods. Positive to grass, weed pollen, trees, cat and dust mites. Borderline positive to soy. Negative to peanuts.  Results discussed with patient/family.  Airborne Adult Perc - 03/26/21 1030     Time Antigen Placed 1030    Allergen Manufacturer Greer    Location Back    Number of Test 59    Panel 1 Select    1. Control-Buffer 50% Glycerol Negative    2. Control-Histamine 1 mg/ml 2+    3. Albumin  saline Negative    4. Thatcher 2+    5. [place] Negative    6. Johnson --   +/-   7. Kentucky Blue 2+    8. Meadow Fescue Negative    9. Perennial Rye 3+    10. Sweet Vernal 2+    11. Timothy 2+    12. Cocklebur Negative    13. Burweed Marshelder Negative    14. Ragweed, short Negative    15. Ragweed, Giant Negative    16. Plantain,  English Negative    17. Lamb's Quarters Negative    18. Sheep Sorrell Negative    19. Rough Pigweed Negative    20. Marsh Elder, Rough Negative    21. Mugwort, Common Negative    22. Ash mix Negative    23. Birch mix 4+    24. Beech American 2+    25. Box, Elder Negative    26. Cedar, red Negative    27. Cottonwood, Eastern 2+    28. Elm mix  Negative    29. Hickory Negative    30. Maple mix Negative    31. Oak, [place] mix Negative    32. Pecan Pollen 3+    33. Pine mix Negative    34. Sycamore Eastern Negative    35. S[place], Black Pollen Negative    36. Alternaria alternata Negative    37. Cladosporium Herbarum Negative    38. Aspergillus mix Negative    39. Penicillium mix Negative    40. Bipolaris sorokiniana (Helminthosporium) Negative    41. Drechslera spicifera (Curvularia) Negative    42. Mucor plumbeus Negative    43. Fusarium moniliforme Negative    44. Aureobasidium pullulans (pullulara) Negative    45. Rhizopus oryzae Negative    46. Botrytis cinera Negative    47. Epicoccum nigrum Negative    48. Phoma betae Negative    49. Candida Albicans Negative    50. Trichophyton mentagrophytes Negative    51. Mite, D Farinae  5,000 AU/ml Negative    52. Mite, D Pteronyssinus  5,000 AU/ml Negative    53. Cat Hair 10,000 BAU/ml 3+    54.  Dog Epithelia Negative    55. Mixed Feathers Negative    56. Horse Epithelia Negative    57. Cockroach, German Negative    58. Mouse Negative    59. Tobacco Leaf Negative             Intradermal - 03/26/21 1056     Time Antigen Placed 1056    Allergen Manufacturer Lavella Hammock    Location Arm    Number of Test 11    Intradermal Select    Control Negative    Gu[place] 2+    Ragweed mix Negative    Weed mix 2+    Mold 1 Negative    Mold 2 Negative    Mold 3 Negative    Mold 4 Negative    Dog Negative    Cockroach Negative    Mite mix 2+             Food Adult Perc - 03/26/21 1000     Time Antigen Placed 1030    Allergen Manufacturer Greer    Location Back    Number of allergen test 2    1. Peanut Negative    2. 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[place]) 01/28/2019   Counseling regarding advance care planning and goals of care 01/28/2019   Iron deficiency anemia due to chronic blood loss 01/05/2019   Inguinal adenopathy 12/22/2018   Past Medical History:  Diagnosis Date   Anemia    Asthma    no meds, allergy induced   Hodgkin's lymphoma ([place])    Inguinal adenopathy 12/22/2018   Past Surgical History: Past Surgical History:  Procedure Laterality Date   MASS EXCISION Left 12/22/2018   Procedure: EXCISION DEEP LEFT INGUINAL LYMPH NODE;  Surgeon: Fanny Skates, MD;  Location: [facility];  Service: General;  Laterality: Left;   Medication List:  Current Outpatient Medications  Medication Sig Dispense Refill   albuterol (PROAIR HFA) 108 (90 Base) MCG/ACT inhaler Inhale 2 puffs into the lungs every 4 (four) hours as needed for wheezing or shortness of breath (coughing fits). 18 g 1   EPINEPHrine 0.3 mg/0.3 mL IJ SOAJ injection Inject 0.3 mg into the muscle as needed for anaphylaxis. 1 each 2   iron polysaccharides (NIFEREX) 150 MG capsule Take 1 capsule (150 mg total) by mouth 2 (two) times daily. 60 capsule 5   ondansetron (ZOFRAN) 4 MG tablet Take 1 tablet (4 mg total) by mouth every 6 (six) hours as needed for nausea. 20 tablet 0   triamcinolone ointment (KENALOG) 0.1 % Apply 1 application topically 2 (two) times daily as needed (rash flare). Do not use on the face, neck, armpits or groin area. Do not use more than 3 weeks in a row. 30 g 2   ZAFEMY 150-35 MCG/24HR transdermal patch 1 patch once a week.     No current facility-administered medications for this visit.   Allergies: Allergies   Allergen Reactions   Fruit & Vegetable Daily [Nutritional Supplements] Swelling    Pineapple, plum,    Peanut-Containing Drug Products Swelling   Iron Sucrose Hives   Social History: Social History   Socioeconomic History   Marital status: Single    Spouse name: Not on file   Number of children: Not on file   Years of education: Not on file   Highest education level: Not on file  Occupational History   Not on file  Tobacco Use   Smoking status: Never   Smokeless tobacco: Never  Vaping Use   Vaping Use: Never used  Substance and Sexual Activity   Alcohol use: No   Drug use: No   Sexual activity: Yes    Birth control/protection: None  Other Topics Concern   Not on file  Social History Narrative   Not on file   Social Determinants of Health   Financial Resource Strain: Not on file  Food Insecurity: Not on file  Transportation Needs: Not on file  Physical Activity: Not on file  Stress: Not on file  Social Connections: Not on file   Lives in a 23 year old house. Smoking: denies Occupation: order fulfillment  Environmental History: Water Damage/mildew in the house: no Carpet in the family room: no Carpet in the bedroom: no Heating: electric Cooling:  central Pet: no  Family History: History reviewed. No pertinent family history. Problem                               Relation Asthma                                   Mother  Eczema                                No  Food allergy                          No  Allergic rhino conjunctivitis     Mother   Review of Systems  Constitutional:  Negative for appetite change, chills, fever and unexpected weight change.  HENT:  Negative for congestion and rhinorrhea.   Eyes:  Negative for itching.  Respiratory:  Negative for cough, chest tightness, shortness of breath and wheezing.   Cardiovascular:  Negative for chest pain.  Gastrointestinal:  Negative for abdominal pain.  Genitourinary:  Negative for difficulty  urinating.  Skin:  Positive for rash.  Allergic/Immunologic: Positive for environmental allergies and food allergies.  Neurological:  Negative for headaches.   Objective: BP 110/72   Pulse 70   Temp 98.2 F (36.8 C) (Temporal)   Resp 16   Ht 5\' 3"  (1.6 m)   Wt 101 lb 9.6 oz (46.1 kg)   SpO2 100%   BMI 18.00 kg/m  Body mass index is 18 kg/m. Physical Exam Vitals and nursing note reviewed.  Constitutional:      Appearance: Normal appearance. She is well-developed.  HENT:     Head: Normocephalic and atraumatic.     Right Ear: External ear normal. There is impacted cerumen.     Left Ear: Tympanic membrane and external ear normal.     Nose: Nose normal.     Mouth/Throat:     Mouth: Mucous membranes are moist.     Pharynx: Oropharynx is clear.  Eyes:     Conjunctiva/sclera: Conjunctivae normal.  Cardiovascular:     Rate and Rhythm: Normal rate and regular rhythm.     Heart sounds: Normal heart sounds. No murmur heard.   No friction rub. No gallop.  Pulmonary:     Effort: Pulmonary effort is normal.     Breath sounds: Normal breath sounds. No wheezing, rhonchi or rales.  Musculoskeletal:     Cervical back: Neck supple.  Skin:    General: Skin is warm.     Findings: Rash present.     Comments: Dry patch on right thumb. Hyperpigmented patched on right antecubital fossa area.  Neurological:     Mental Status: She is alert and oriented to person, place, and time.  Psychiatric:        Behavior: Behavior normal.   The plan was reviewed with the patient/family, and all questions/concerned were addressed.  It was my pleasure to see Tyler today and participate in her care. Please feel free to contact me with any questions or concerns.  Sincerely,  Rexene Alberts, DO Allergy & Immunology  [facility] office: [place] office: [phone]

## 2021-03-26 ENCOUNTER — Encounter: Payer: Self-pay | Admitting: Allergy

## 2021-03-26 ENCOUNTER — Ambulatory Visit (INDEPENDENT_AMBULATORY_CARE_PROVIDER_SITE_OTHER): Payer: Medicaid Other | Admitting: Allergy

## 2021-03-26 ENCOUNTER — Inpatient Hospital Stay: Payer: Medicaid Other | Attending: Hematology | Admitting: Hematology

## 2021-03-26 ENCOUNTER — Other Ambulatory Visit: Payer: Self-pay

## 2021-03-26 ENCOUNTER — Inpatient Hospital Stay: Payer: Medicaid Other

## 2021-03-26 VITALS — BP 110/72 | HR 70 | Temp 98.2°F | Resp 16 | Ht 63.0 in | Wt 101.6 lb

## 2021-03-26 DIAGNOSIS — J3089 Other allergic rhinitis: Secondary | ICD-10-CM | POA: Diagnosis not present

## 2021-03-26 DIAGNOSIS — T781XXD Other adverse food reactions, not elsewhere classified, subsequent encounter: Secondary | ICD-10-CM

## 2021-03-26 DIAGNOSIS — T50995D Adverse effect of other drugs, medicaments and biological substances, subsequent encounter: Secondary | ICD-10-CM | POA: Insufficient documentation

## 2021-03-26 DIAGNOSIS — L2089 Other atopic dermatitis: Secondary | ICD-10-CM | POA: Diagnosis not present

## 2021-03-26 DIAGNOSIS — Z8709 Personal history of other diseases of the respiratory system: Secondary | ICD-10-CM | POA: Diagnosis not present

## 2021-03-26 DIAGNOSIS — H1013 Acute atopic conjunctivitis, bilateral: Secondary | ICD-10-CM

## 2021-03-26 MED ORDER — ALBUTEROL SULFATE HFA 108 (90 BASE) MCG/ACT IN AERS: 2.0000 | INHALATION_SPRAY | RESPIRATORY_TRACT | 1 refills | Status: DC | PRN

## 2021-03-26 MED ORDER — TRIAMCINOLONE ACETONIDE 0.1 % EX OINT: 1.0000 "application " | TOPICAL_OINTMENT | Freq: Two times a day (BID) | CUTANEOUS | 2 refills | Status: DC | PRN

## 2021-03-26 MED ORDER — EPINEPHRINE 0.3 MG/0.3ML IJ SOAJ: 0.3000 mg | INTRAMUSCULAR | 2 refills | Status: AC | PRN

## 2021-03-26 NOTE — Assessment & Plan Note (Signed)
History of asthma but no symptoms/albuterol use for a few years.  Today's spirometry showed some restriction. . Monitor breathing symptoms. . May use albuterol rescue inhaler 2 puffs every 4 to 6 hours as needed for shortness of breath, chest tightness, coughing, and wheezing. Monitor frequency of use.

## 2021-03-26 NOTE — Assessment & Plan Note (Signed)
.   See assessment and plan as above. 

## 2021-03-26 NOTE — Assessment & Plan Note (Signed)
Perioral discomfort with plums, peanuts and soy.  Today's skin testing showed: Borderline positive to soy. Negative to peanuts.  . Continue to avoid that are bothersome - plums, peanuts, soy. . 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 This is caused by cross reactivity of pollen with fresh fruits and vegetables, and nuts. Symptoms are usually localized in the form of itching and burning in mouth and throat. Very rarely it can progress to more severe symptoms. Eating foods in cooked or processed forms usually minimizes symptoms. I recommended avoidance of eating the problem foods, especially during the peak season(s). Sometimes, OFAS can induce severe throat swelling or even a systemic reaction; with such instance, I advised them to report to a local ER. A list of common pollens and food cross-reactivities was provided to the patient.

## 2021-03-26 NOTE — Patient Instructions (Addendum)
Today's skin testing showed: Positive to grass, weed pollen, trees, cat and dust mites. Borderline positive to soy. Negative to peanuts.  Results given.  Iron infusions No testing available for iron infusions. As long as you are doing okay with oral iron then okay to take oral iron. If your physician recommending iron infusions then you will need to be seen by an academic center allergy department such as at [facility] [place], [place] or [facility] for further evaluation.  Environmental allergies Start environmental control measures as below. Use over the counter antihistamines such as Zyrtec (cetirizine), Claritin (loratadine), Allegra (fexofenadine), or Xyzal (levocetirizine) daily as needed. May take twice a day during allergy flares. May switch antihistamines every few months. Start allergy injections. Had a detailed discussion with patient/family that clinical history is suggestive of allergic rhinitis, and may benefit from allergy immunotherapy (AIT). Discussed in detail regarding the dosing, schedule, side effects (mild to moderate local allergic reaction and rarely systemic allergic reactions including anaphylaxis), and benefits (significant improvement in nasal symptoms, seasonal flares of asthma) of immunotherapy with the patient. There is significant time commitment involved with allergy shots, which includes weekly immunotherapy injections for first 9-12 months and then biweekly to monthly injections for 3-5 years. Consent was signed. I have prescribed epinephrine injectable and demonstrated proper use. For mild symptoms you can take over the counter antihistamines such as Benadryl and monitor symptoms closely. If symptoms worsen or if you have severe symptoms including breathing issues, throat closure, significant swelling, whole body hives, severe diarrhea and vomiting, lightheadedness then inject epinephrine and seek immediate medical care afterwards.Emergency action plan given.  Food: Continue to avoid  that are bothersome - plums, peanuts, soy. 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 This is caused by cross reactivity of pollen with fresh fruits and vegetables, and nuts. Symptoms are usually localized in the form of itching and burning in mouth and throat. Very rarely it can progress to more severe symptoms. Eating foods in cooked or processed forms usually minimizes symptoms. I recommended avoidance of eating the problem foods, especially during the peak season(s). Sometimes, OFAS can induce severe throat swelling or even a systemic reaction; with such instance, I advised them to report to a local ER. A list of common pollens and food cross-reactivities was provided to the patient.   History of asthma: Monitor breathing symptoms. May use albuterol rescue inhaler 2 puffs every 4 to 6 hours as needed for shortness of breath, chest tightness, coughing, and wheezing. Monitor frequency of use.   Eczema: See below for proper skin care. Use triamcinolone 0.1% ointment twice a day as needed for rash flares. Do not use on the face, neck, armpits or groin area. Do not use more than 3 weeks in a row.   Follow up in 4 months or sooner if needed.   Follow up in 3 weeks for first allergy injection.  Reducing Pollen Exposure Pollen seasons: trees (spring), grass (summer) and ragweed/weeds (fall). Keep windows closed in your home and car to lower pollen exposure.  Install air conditioning in the bedroom and throughout the house if possible.  Avoid going out in dry windy days - especially early morning. Pollen counts are highest between 5 - 10 AM and on dry, hot and windy days.  Save outside activities for late afternoon or after a heavy rain, when pollen levels are lower.  Avoid mowing of grass if you have grass pollen allergy. Be aware that  pollen can also be transported indoors on people and pets.  Dry your clothes in an automatic  dryer rather than hanging them outside where they might collect pollen.  Rinse hair and eyes before bedtime. Control of House Dust Mite Allergen Dust mite allergens are a common trigger of allergy and asthma symptoms. While they can be found throughout the house, these microscopic creatures thrive in warm, humid environments such as bedding, upholstered furniture and carpeting. Because so much time is spent in the bedroom, it is essential to reduce mite levels there.  Encase pillows, mattresses, and box springs in special allergen-proof fabric covers or airtight, zippered plastic covers.  Bedding should be washed weekly in hot water (130 F) and dried in a hot dryer. Allergen-proof covers are available for comforters and pillows that can't be regularly washed.  Wash the allergy-proof covers every few months. Minimize clutter in the bedroom. Keep pets out of the bedroom.  Keep humidity less than 50% by using a dehumidifier or air conditioning. You can buy a humidity measuring device called a hygrometer to monitor this.  If possible, replace carpets with hardwood, linoleum, or washable area rugs. If that's not possible, vacuum frequently with a vacuum that has a HEPA filter. Remove all upholstered furniture and non-washable window drapes from the bedroom. Remove all non-washable stuffed toys from the bedroom.  Wash stuffed toys weekly. Pet Allergen Avoidance: Contrary to popular opinion, there are no "hypoallergenic" breeds of dogs or cats. That is because people are not allergic to an animal's hair, but to an allergen found in the animal's saliva, dander (dead skin flakes) or urine. Pet allergy symptoms typically occur within minutes. For some people, symptoms can build up and become most severe 8 to 12 hours after contact with the animal. People with severe allergies can experience reactions in public places if dander has been transported on the pet owners' clothing. Keeping an animal outdoors is only  a partial solution, since homes with pets in the yard still have higher concentrations of animal allergens. Before getting a pet, ask your allergist to determine if you are allergic to animals. If your pet is already considered part of your family, try to minimize contact and keep the pet out of the bedroom and other rooms where you spend a great deal of time. As with dust mites, vacuum carpets often or replace carpet with a hardwood floor, tile or linoleum. High-efficiency particulate air (HEPA) cleaners can reduce allergen levels over time. While dander and saliva are the source of cat and dog allergens, urine is the source of allergens from rabbits, hamsters, mice and [place] pigs; so ask a non-allergic family member to clean the animal's cage. If you have a pet allergy, talk to your allergist about the potential for allergy immunotherapy (allergy shots). This strategy can often provide long-term relief.

## 2021-03-26 NOTE — Assessment & Plan Note (Signed)
Patient has history of iron deficiency anemia and used to receive iron infusions in 2020 with no issues.  Received infusion 2022 with numbness, hand swelling, rash, itching, chest pain.  Treated with Benadryl and Solu-Medrol.  Questionable loss of consciousness with dizziness/vomiting leading to ER visit with observation.  Symptoms completely resolved after 3 days.  Currently tolerates oral iron replacement with no problems. . Discussed with patient that there is no skin prick testing available for iron infusions. . The fact that she tolerates oral iron pills with no issues makes the iron component less likely to be the culprit. . If patient is requiring iron infusions in the future, then recommend that she be referred to allergy at an academic center such as [facility], [facility] or [facility] for further evaluation. o We don't offer IV medication drug challenges in our clinic.

## 2021-03-26 NOTE — Assessment & Plan Note (Addendum)
Rhinoconjunctivitis symptoms in the spring.  Patient used to be on allergy injections with good benefit and interested in restarting if able. Today's skin testing was positive to grass, weed, trees, cat and dust mites.  Start environmental control measures as below.  Use over the counter antihistamines such as Zyrtec (cetirizine), Claritin (loratadine), Allegra (fexofenadine), or Xyzal (levocetirizine) daily as needed. May take twice a day during allergy flares. May switch antihistamines every few months.  Start allergy injections - will send in orders once patient schedules for first injection.   Had a detailed discussion with patient/family that clinical history is suggestive of allergic rhinitis, and may benefit from allergy immunotherapy (AIT). Discussed in detail regarding the dosing, schedule, side effects (mild to moderate local allergic reaction and rarely systemic allergic reactions including anaphylaxis), and benefits (significant improvement in nasal symptoms, seasonal flares of asthma) of immunotherapy with the patient. There is significant time commitment involved with allergy shots, which includes weekly immunotherapy injections for first 9-12 months and then biweekly to monthly injections for 3-5 years. Consent was signed.  I have prescribed epinephrine injectable and demonstrated proper use. For mild symptoms you can take over the counter antihistamines such as Benadryl and monitor symptoms closely. If symptoms worsen or if you have severe symptoms including breathing issues, throat closure, significant swelling, whole body hives, severe diarrhea and vomiting, lightheadedness then inject epinephrine and seek immediate medical care afterwards.Emergency action plan given.

## 2021-03-26 NOTE — Assessment & Plan Note (Signed)
Not controlled. . See below for proper skin care. . Use triamcinolone 0.1% ointment twice a day as needed for rash flares. Do not use on the face, neck, armpits or groin area. Do not use more than 3 weeks in a row.

## 2021-03-27 ENCOUNTER — Telehealth: Payer: Self-pay | Admitting: Hematology

## 2021-03-27 NOTE — Telephone Encounter (Signed)
Scheduled per sch msg. Called and left msg  

## 2021-04-06 ENCOUNTER — Other Ambulatory Visit: Payer: Self-pay | Admitting: *Deleted

## 2021-04-06 MED ORDER — EPIPEN 2-PAK 0.3 MG/0.3ML IJ SOAJ: 0.3000 mg | Freq: Once | INTRAMUSCULAR | 1 refills | Status: AC

## 2021-04-15 IMAGING — US ULTRASOUND OF THE LYMPH NODES
1 series · 13 of 15 positions shown · non-contrast
Comparison: none

INDICATION: 23-year-old with large left inguinal mass or lymph node. Tissue
diagnosis is needed.

[Series 1: ultrasound of the lymph nodes · 13 of 15 slices shown]
[im 1/15]
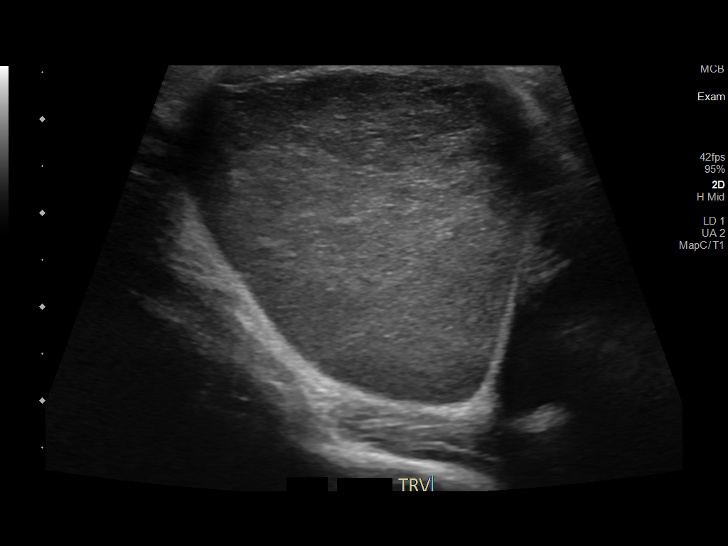
[im 2/15]
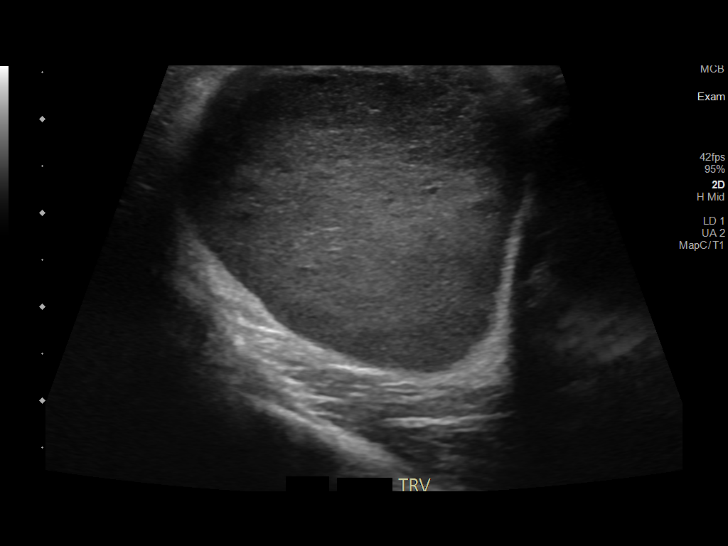
[im 3/15]
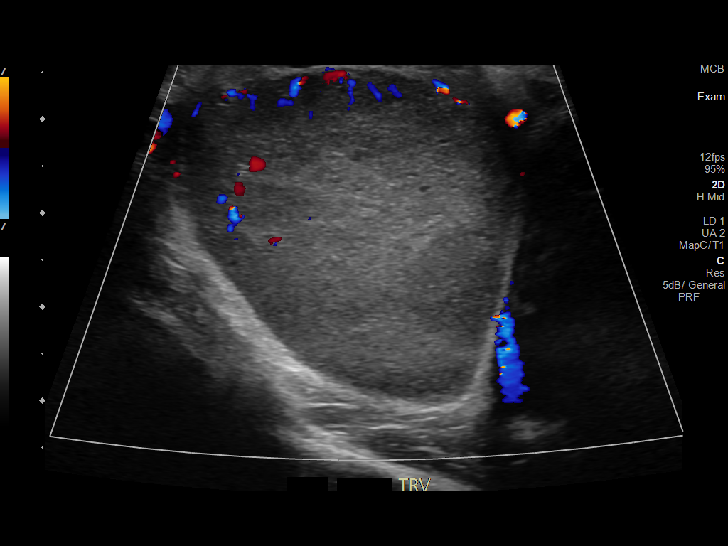
[im 5/15]
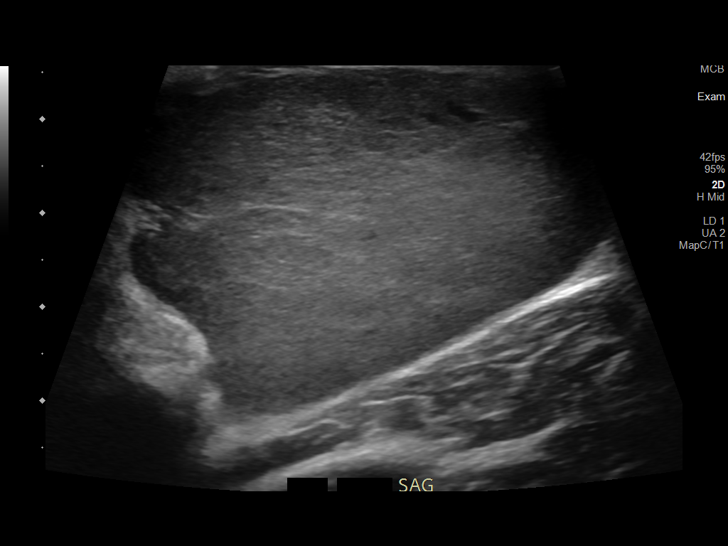
[im 6/15]
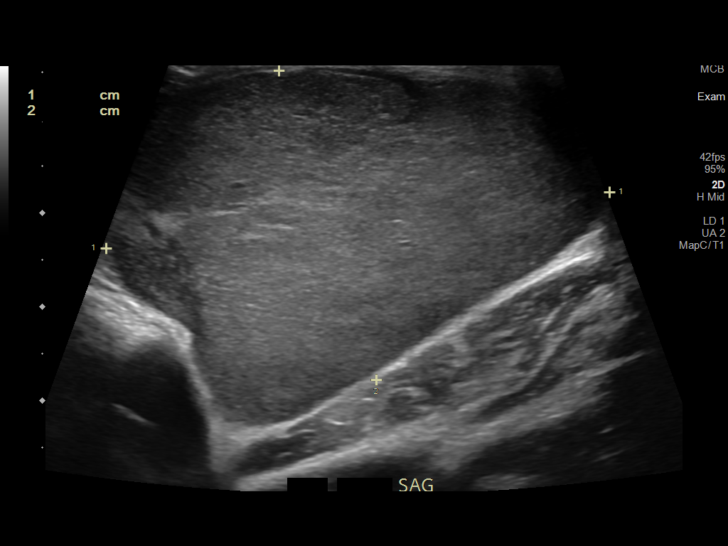
[im 7/15]
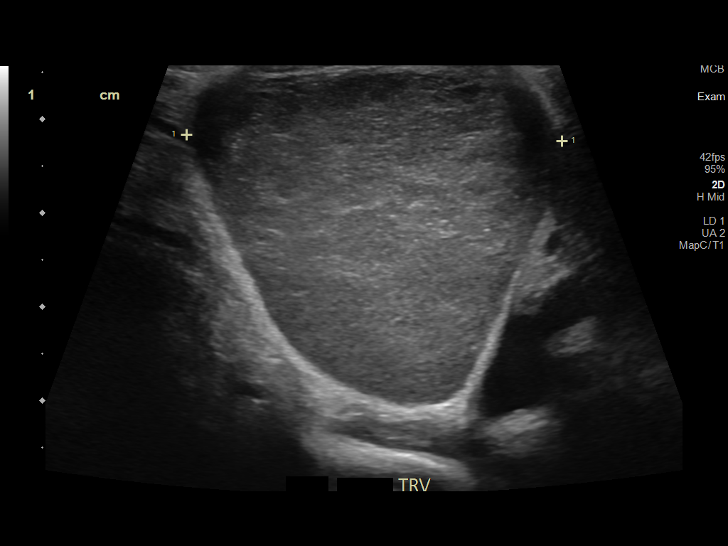
[im 8/15]
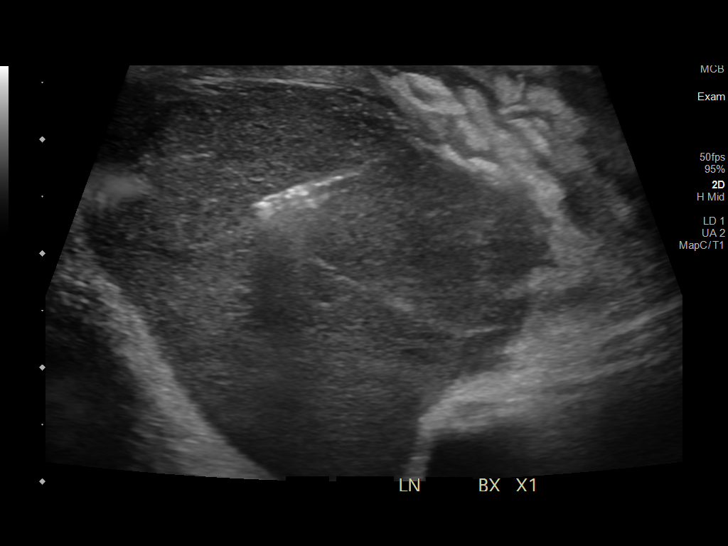
[im 9/15]
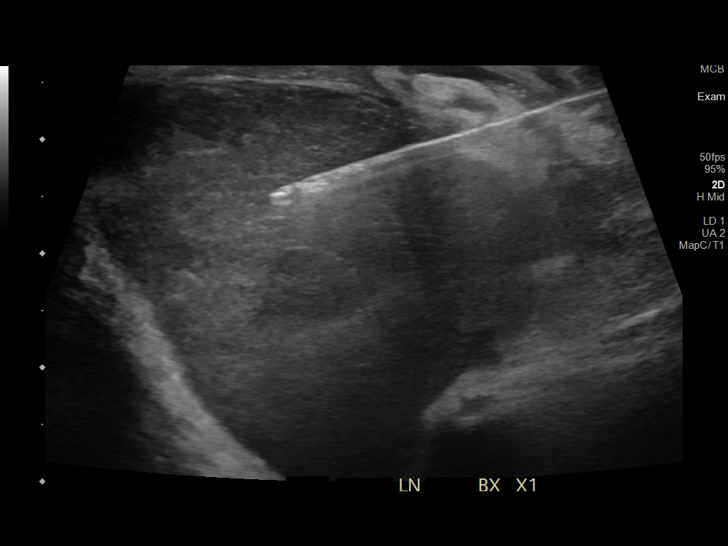
[im 10/15]
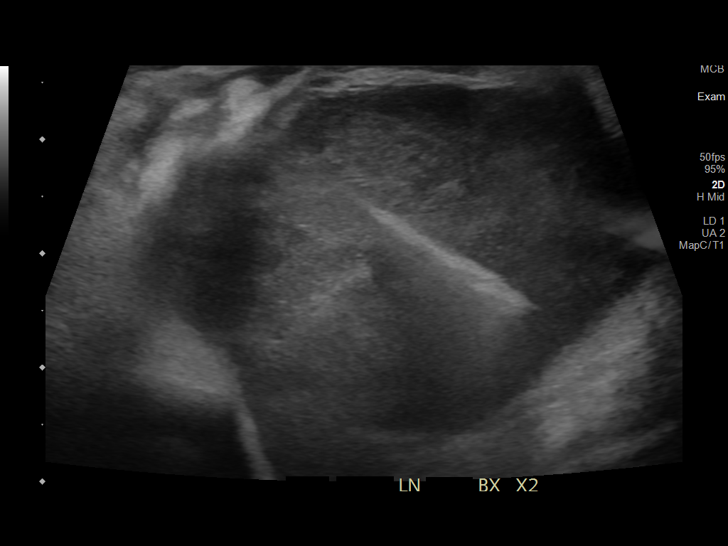
[im 11/15]
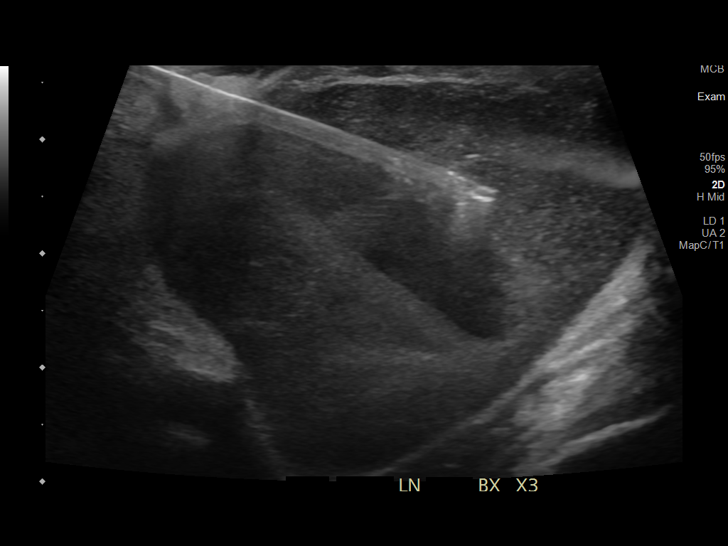
[im 13/15]
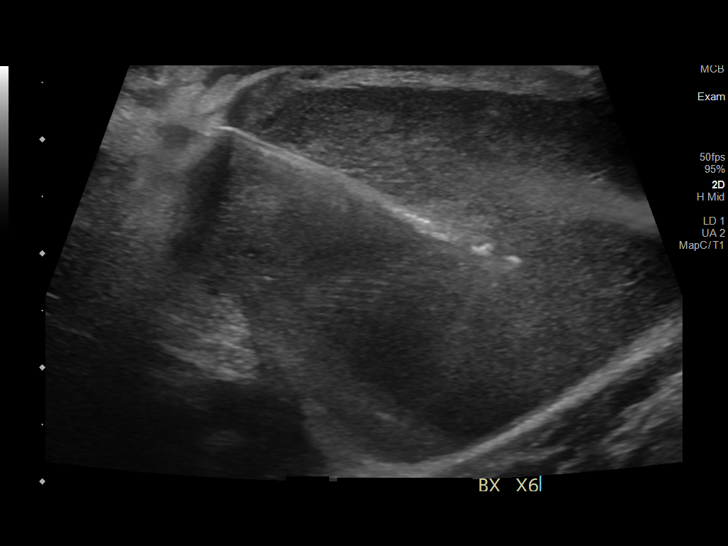
[im 14/15]
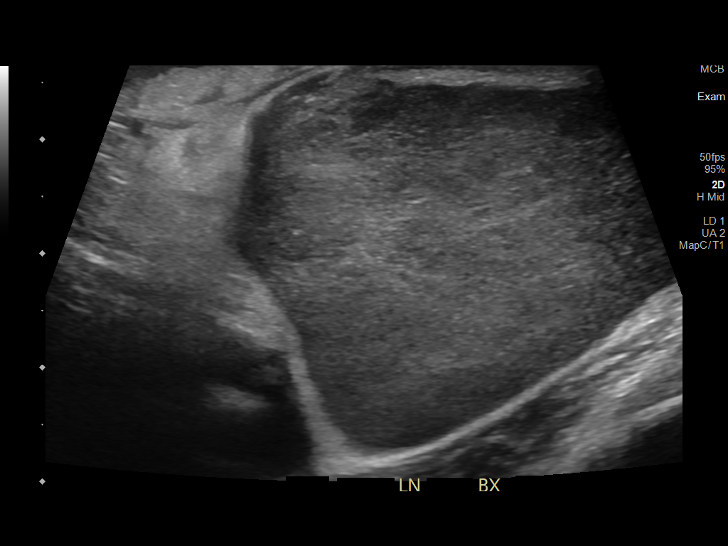
[im 15/15]
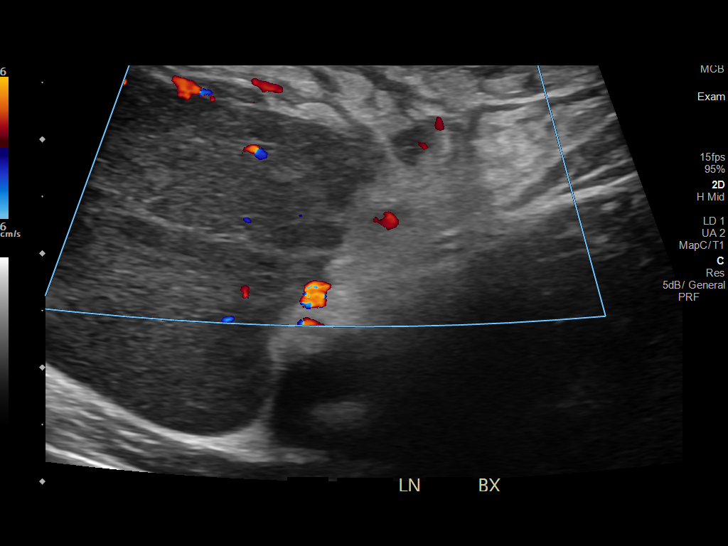

[13 of 15 positions shown; findings below may reference images not displayed]

EXAM:
ULTRASOUND-GUIDED CORE BIOPSY OF LEFT INGUINAL MASS/LYMPH NODE

MEDICATIONS:
None.

ANESTHESIA/SEDATION:
Moderate (conscious) sedation was employed during this procedure. A
total of Versed 2.0 mg and Fentanyl 50 mcg was administered
intravenously.

Moderate Sedation Time: 15 minutes. The patient's level of
consciousness and vital signs were monitored continuously by
radiology nursing throughout the procedure under my direct
supervision.

FLUOROSCOPY TIME:  None

COMPLICATIONS:
None immediate.

PROCEDURE:
Informed written consent was obtained from the patient after a
thorough discussion of the procedural risks, benefits and
alternatives. All questions were addressed. Maximal Sterile Barrier
Technique was utilized including caps, mask, sterile gowns, sterile
gloves, sterile drape, hand hygiene and skin antiseptic. A timeout
was performed prior to the initiation of the procedure.

Patient was placed supine. The large left inguinal mass/lymph node
was easily identified with ultrasound. The area of concern was
prepped with chlorhexidine and sterile field was created. Skin was
anesthetized with 1% lidocaine. Using ultrasound guidance, 18 gauge
core needle was directed into the left inguinal lesion. Total of 6
core biopsies were obtained. Specimens placed in saline. Bandage
placed over the puncture site.
FINDINGS: Large homogeneous soft tissue lesion or lymph node in the left
inguinal region. Core biopsy needle confirmed within the lesion. No
significant bleeding or hematoma formation at the end of the
procedure.
IMPRESSION: Ultrasound-guided core biopsy of the left inguinal mass/lymph node.

## 2021-04-16 ENCOUNTER — Inpatient Hospital Stay: Payer: Medicaid Other

## 2021-04-16 ENCOUNTER — Inpatient Hospital Stay: Payer: Medicaid Other | Attending: Hematology | Admitting: Hematology

## 2021-06-06 IMAGING — CT NUCLEAR MEDICINE PET IMAGE INITIAL (PI) SKULL BASE TO THIGH
1 of 8 series · 3 of 16 positions shown, 4 images · non-contrast
Comparison: CT abdomen/pelvis from 11/17/2018

CLINICAL DATA: Initial treatment strategy for Hodgkin lymphoma.

EXAM:
NUCLEAR MEDICINE PET SKULL BASE TO THIGH
TECHNIQUE: 5.0 mCi F-18 FDG was injected intravenously. Full-ring PET imaging
was performed from the skull base to thigh after the radiotracer. CT
data was obtained and used for attenuation correction and anatomic
localization.
Fasting blood glucose: 82 mg/dl

[Series 4: ct sk_thigh 5.0 b31f · axial · 0.98mm/px · z∈[-1304,-496]mm · 3 of 203 slices shown, 4 images]
[im 1/203  soft-tissue]
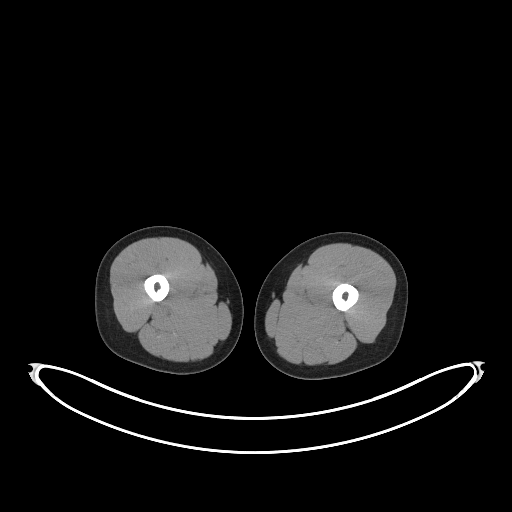
[im 1/203  bone]
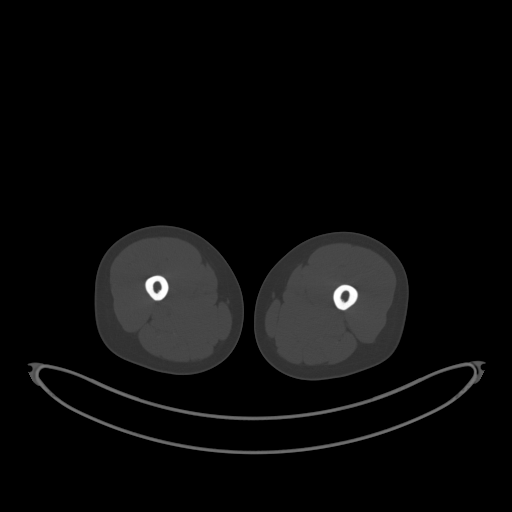
[im 102/203  soft-tissue]
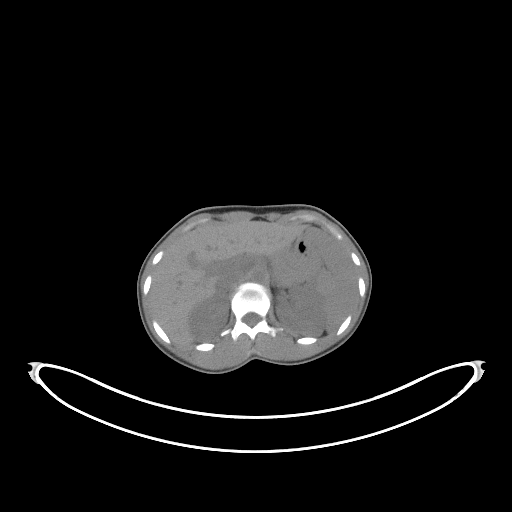
[im 203/203  soft-tissue]
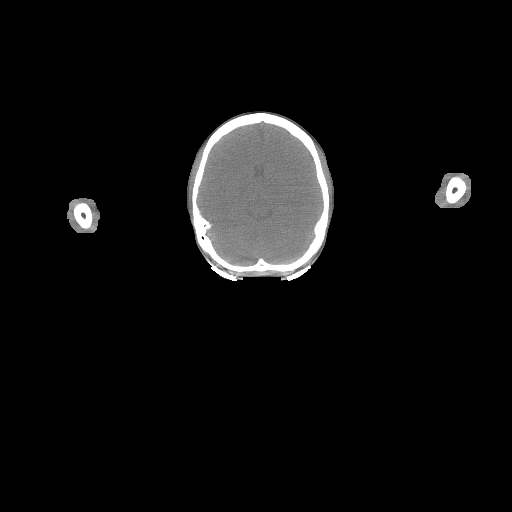

[3 of 16 positions shown; findings below may reference images not displayed]

FINDINGS: Mediastinal blood pool activity: SUV max

Liver activity: SUV max

NECK: Symmetric activity along the submandibular glands and tongue
base. Left palatine tonsillar activity maximum SUV 8.7, right-sided
activity 7.0, without masslike appearance. No appreciable adenopathy
in the neck.

Incidental CT findings: none

CHEST: Bilateral axillary and right subpectoral adenopathy noted. A
1.4 cm left axillary node on image 47/4 has a maximum SUV of 7.1,
compatible with [HOSPITAL] 5 disease. A right axillary lymph node
measuring 1.1 cm in short axis on image 51/4 has a maximum SUV of
4.6, compatible with [HOSPITAL] 4 disease. Low-grade anterior
mediastinal density in activity is compatible with thymic tissue.

Incidental CT findings: none

ABDOMEN/PELVIS: Diffuse uniform endometrial activity is observed
with maximum SUV of 13.2. This is high for physiologic endometrial
activity.

Low-grade activity at the site of excisional biopsy of the left
inguinal lymph node, maximum SUV 2.9 in this vicinity, [HOSPITAL] 4.

No abnormal splenic activity or splenomegaly.

Incidental CT findings: none

SKELETON: No significant abnormal hypermetabolic activity in this
region.

Incidental CT findings: none
IMPRESSION: 1. Bilateral axillary and right subpectoral adenopathy noted with
[HOSPITAL] 5 disease on the left and [HOSPITAL] 4 disease on the right
in this region.
2. Diffuse uniform endometrial activity with SUV of 13.2. Although
this is very high for physiologic endometrial activity, the diffuse
uniform distribution is more characteristic of benign physiologic
endometrial activity rather than malignancy. If the patient has
abnormal uterine bleeding or if otherwise clinically warranted,
pelvic sonography could be utilized for further characterization of
the endometrium.
3. Postoperative findings and low-grade metabolic activity in the
vicinity of the excision biopsy of the left inguinal lymph node. No
other hypermetabolic adenopathy in the abdomen/pelvis identified.

## 2021-06-14 ENCOUNTER — Other Ambulatory Visit: Payer: Medicaid Other

## 2021-06-15 ENCOUNTER — Ambulatory Visit: Payer: Medicaid Other | Admitting: Hematology

## 2021-07-01 NOTE — L&D Delivery Note (Signed)
Delivery Note At 8:25 AM a viable female was delivered via Vaginal, Spontaneous (Presentation: Left Occiput Anterior).  APGAR: 9, 9; weight 5 lb 11.4 oz (2590 g).   Placenta status: Spontaneous, Intact.  Cord: 3 vessels with the following complications: None.  Cord pH: NA  Anesthesia: Epidural Episiotomy: None Lacerations: None Suture Repair:  NA Est. Blood Loss (mL): 100  Mom to postpartum.  Baby to Couplet care / Skin to Skin.  Christophe Louis 03/15/2022, 6:39 AM

## 2021-07-04 ENCOUNTER — Telehealth: Payer: Self-pay | Admitting: Hematology

## 2021-07-04 ENCOUNTER — Inpatient Hospital Stay: Payer: Medicaid Other

## 2021-07-04 NOTE — Telephone Encounter (Signed)
Rescheduled today and tomorrow's appointments per patient's request.

## 2021-07-05 ENCOUNTER — Ambulatory Visit: Payer: Medicaid Other | Admitting: Hematology

## 2021-07-09 ENCOUNTER — Inpatient Hospital Stay: Payer: Medicaid Other | Attending: Hematology

## 2021-07-09 ENCOUNTER — Other Ambulatory Visit: Payer: Self-pay

## 2021-07-09 DIAGNOSIS — Z79899 Other long term (current) drug therapy: Secondary | ICD-10-CM | POA: Insufficient documentation

## 2021-07-09 DIAGNOSIS — D500 Iron deficiency anemia secondary to blood loss (chronic): Secondary | ICD-10-CM

## 2021-07-09 DIAGNOSIS — D509 Iron deficiency anemia, unspecified: Secondary | ICD-10-CM | POA: Diagnosis not present

## 2021-07-09 DIAGNOSIS — Z8571 Personal history of Hodgkin lymphoma: Secondary | ICD-10-CM | POA: Diagnosis present

## 2021-07-09 LAB — CMP (CANCER CENTER ONLY)
ALT: 20 U/L (ref 0–44)
AST: 18 U/L (ref 15–41)
Albumin: 4.5 g/dL (ref 3.5–5.0)
Alkaline Phosphatase: 55 U/L (ref 38–126)
Anion gap: 6 (ref 5–15)
BUN: 9 mg/dL (ref 6–20)
CO2: 26 mmol/L (ref 22–32)
Calcium: 9.4 mg/dL (ref 8.9–10.3)
Chloride: 107 mmol/L (ref 98–111)
Creatinine: 0.79 mg/dL (ref 0.44–1.00)
GFR, Estimated: >60 mL/min (ref >60)
Glucose, Bld: 93 mg/dL (ref 70–99)
Potassium: 3.5 mmol/L (ref 3.5–5.1)
Sodium: 139 mmol/L (ref 135–145)
Total Bilirubin: 0.6 mg/dL (ref 0.3–1.2)
Total Protein: 7.1 g/dL (ref 6.5–8.1)

## 2021-07-09 LAB — CBC WITH DIFFERENTIAL (CANCER CENTER ONLY)
Abs Immature Granulocytes: 0.01 10*3/uL (ref 0.00–0.07)
Basophils Absolute: 0.1 10*3/uL (ref 0.0–0.1)
Basophils Relative: 2 %
Eosinophils Absolute: 0.1 10*3/uL (ref 0.0–0.5)
Eosinophils Relative: 3 %
HCT: 32.7 % — ABNORMAL LOW (ref 36.0–46.0)
Hemoglobin: 10 g/dL — ABNORMAL LOW (ref 12.0–15.0)
Immature Granulocytes: 0 %
Lymphocytes Relative: 46 %
Lymphs Abs: 1.6 10*3/uL (ref 0.7–4.0)
MCH: 21.8 pg — ABNORMAL LOW (ref 26.0–34.0)
MCHC: 30.6 g/dL (ref 30.0–36.0)
MCV: 71.2 fL — ABNORMAL LOW (ref 80.0–100.0)
Monocytes Absolute: 0.3 10*3/uL (ref 0.1–1.0)
Monocytes Relative: 8 %
Neutro Abs: 1.4 10*3/uL — ABNORMAL LOW (ref 1.7–7.7)
Neutrophils Relative %: 41 %
Platelet Count: 310 10*3/uL (ref 150–400)
RBC: 4.59 MIL/uL (ref 3.87–5.11)
RDW: 17.2 % — ABNORMAL HIGH (ref 11.5–15.5)
WBC Count: 3.5 10*3/uL — ABNORMAL LOW (ref 4.0–10.5)
nRBC: 0 % (ref 0.0–0.2)

## 2021-07-09 LAB — IRON AND IRON BINDING CAPACITY (CC-WL,HP ONLY)
Iron: 17 ug/dL — ABNORMAL LOW (ref 28–170)
Saturation Ratios: 4 % — ABNORMAL LOW (ref 10.4–31.8)
TIBC: 456 ug/dL — ABNORMAL HIGH (ref 250–450)
UIBC: 439 ug/dL (ref 148–442)

## 2021-07-09 LAB — LACTATE DEHYDROGENASE: LDH: 137 U/L (ref 98–192)

## 2021-07-09 LAB — FERRITIN: Ferritin: <4 ng/mL — ABNORMAL LOW (ref 11–307)

## 2021-07-10 ENCOUNTER — Inpatient Hospital Stay (HOSPITAL_BASED_OUTPATIENT_CLINIC_OR_DEPARTMENT_OTHER): Payer: Medicaid Other | Admitting: Hematology

## 2021-07-10 DIAGNOSIS — D500 Iron deficiency anemia secondary to blood loss (chronic): Secondary | ICD-10-CM | POA: Diagnosis not present

## 2021-07-10 DIAGNOSIS — Z8571 Personal history of Hodgkin lymphoma: Secondary | ICD-10-CM | POA: Diagnosis not present

## 2021-07-10 DIAGNOSIS — C8108 Nodular lymphocyte predominant Hodgkin lymphoma, lymph nodes of multiple sites: Secondary | ICD-10-CM | POA: Diagnosis not present

## 2021-07-11 ENCOUNTER — Telehealth: Payer: Self-pay | Admitting: Hematology

## 2021-07-11 NOTE — Telephone Encounter (Signed)
Left message with follow-up appointment per 1/10 los. °

## 2021-07-16 NOTE — Progress Notes (Addendum)
HEMATOLOGY/ONCOLOGY PHONE VISIT NOTE  Date of Service: .07/10/2021   Patient Care Team: [name], PA as PCP - General (Family Medicine)   CHIEF COMPLAINTS: -nodular Lymphocyte predominant hodgkins lymphoma -Iron deficiency anemia  INTERVAL HISTORY:  .I connected with Vivien Rota C. Uncapher on 07/17/21 at 10:00 AM EST by telephone visit and verified that I am speaking with the correct person using two identifiers.   I discussed the limitations, risks, security and privacy concerns of performing an evaluation and management service by telemedicine and the availability of in-person appointments. I also discussed with the patient that there may be a patient responsible charge related to this service. The patient expressed understanding and agreed to proceed.   Other persons participating in the visit and their role in the encounter: None  Patients location: Home Providers location: [place]  Chief Complaint: Follow-up for iron deficiency anemia and lab review  Ms .Karma Greaser Howland was called to follow-up on her iron deficiency anemia and nodular lymphocyte predominant Hodgkin's lymphoma and lab review. She notes that she has been taking the oral iron polysaccharide on and off and not consistently as prescribed.  Her periods have slowed down since she has been using Zafemy patch. Complains of mild fatigue but no lightheadedness or dizziness. Labs on 07/09/2021 show hemoglobin still low at 10 with MCV of 71.2 WBC count of 3.5k and platelets of 310k. CMP within normal limits LDH within normal limits at 137. Ferritin less than 4 and iron saturation of 4%. We discussed that she is still quite iron deficient and offer her the option of IV iron replacement with premedications.  She did have hives from IV Venofer previously and is hesitant to try different IV iron preparation and declines this option even with significant premedications. We recommended she start taking  the iron polysaccharide 150 mg twice daily with complete compliance and can use over-the-counter stool softener if constipation is limiting. We recommended she take this with orange juice and not with calcium containing dairy products to maximize absorption.  MEDICAL HISTORY:  Past Medical History:  Diagnosis Date   Anemia    Asthma    no meds, allergy induced   Hodgkin's lymphoma ([place])    Inguinal adenopathy 12/22/2018    SURGICAL HISTORY: Past Surgical History:  Procedure Laterality Date   MASS EXCISION Left 12/22/2018   Procedure: EXCISION DEEP LEFT INGUINAL LYMPH NODE;  Surgeon: Fanny Skates, MD;  Location: [facility];  Service: General;  Laterality: Left;    SOCIAL HISTORY: Social History   Socioeconomic History   Marital status: Single    Spouse name: Not on file   Number of children: Not on file   Years of education: Not on file   Highest education level: Not on file  Occupational History   Not on file  Tobacco Use   Smoking status: Never   Smokeless tobacco: Never  Vaping Use   Vaping Use: Never used  Substance and Sexual Activity   Alcohol use: No   Drug use: No   Sexual activity: Yes    Birth control/protection: None  Other Topics Concern   Not on file  Social History Narrative   Not on file   Social Determinants of Health   Financial Resource Strain: Not on file  Food Insecurity: Not on file  Transportation Needs: Not on file  Physical Activity: Not on file  Stress: Not on file  Social Connections: Not on file  Intimate Partner Violence:  Not on file    FAMILY HISTORY: No family history on file.  ALLERGIES:  is allergic to fruit & vegetable daily [nutritional supplements], peanut-containing drug products, and iron sucrose.  MEDICATIONS:  Current Outpatient Medications  Medication Sig Dispense Refill   albuterol (PROAIR HFA) 108 (90 Base) MCG/ACT inhaler Inhale 2 puffs into the lungs every 4 (four) hours as needed for wheezing  or shortness of breath (coughing fits). 18 g 1   EPINEPHrine 0.3 mg/0.3 mL IJ SOAJ injection Inject 0.3 mg into the muscle as needed for anaphylaxis. 1 each 2   iron polysaccharides (NIFEREX) 150 MG capsule Take 1 capsule (150 mg total) by mouth 2 (two) times daily. 60 capsule 5   ondansetron (ZOFRAN) 4 MG tablet Take 1 tablet (4 mg total) by mouth every 6 (six) hours as needed for nausea. 20 tablet 0   triamcinolone ointment (KENALOG) 0.1 % Apply 1 application topically 2 (two) times daily as needed (rash flare). Do not use on the face, neck, armpits or groin area. Do not use more than 3 weeks in a row. 30 g 2   ZAFEMY 150-35 MCG/24HR transdermal patch 1 patch once a week.     No current facility-administered medications for this visit.    REVIEW OF SYSTEMS:   No lightheadedness or dizziness.  No other evidence of bleeding no GI bleeding no gum bleeding no epistaxis.  PHYSICAL EXAMINATION: Telemedicine visit  LABORATORY DATA:  I have reviewed the data as listed  CBC Latest Ref Rng & Units 07/09/2021 02/15/2021 12/26/2020  WBC 4.0 - 10.5 K/uL 3.5(L) 4.9 4.3  Hemoglobin 12.0 - 15.0 g/dL 10.0(L) 10.7(L) 9.2(L)  Hematocrit 36.0 - 46.0 % 32.7(L) 34.5(L) 31.1(L)  Platelets 150 - 400 K/uL 310 269 204   CMP Latest Ref Rng & Units 07/09/2021 02/15/2021 12/21/2020  Glucose 70 - 99 mg/dL 93 83 174(H)  BUN 6 - 20 mg/dL 9 9 9   Creatinine 0.44 - 1.00 mg/dL 0.79 0.85 0.83  Sodium 135 - 145 mmol/L 139 139 138  Potassium 3.5 - 5.1 mmol/L 3.5 3.9 4.1  Chloride 98 - 111 mmol/L 107 106 110  CO2 22 - 32 mmol/L 26 25 22   Calcium 8.9 - 10.3 mg/dL 9.4 9.0 8.9  Total Protein 6.5 - 8.1 g/dL 7.1 6.6 -  Total Bilirubin 0.3 - 1.2 mg/dL 0.6 0.4 -  Alkaline Phos 38 - 126 U/L 55 57 -  AST 15 - 41 U/L 18 15 -  ALT 0 - 44 U/L 20 10 -   . Lab Results  Component Value Date   IRON 17 (L) 07/09/2021   TIBC 456 (H) 07/09/2021   IRONPCTSAT 4 (L) 07/09/2021   (Iron and TIBC)  Lab Results  Component Value Date    FERRITIN <4 (L) 07/09/2021   . Lab Results  Component Value Date   LDH 137 07/09/2021    RADIOGRAPHIC STUDIES: No images were reviewed during this visit.  ASSESSMENT & PLAN:   1. 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 The sample was sent for a second opinion and is awaiting further characterization for confirmation. -11/17/18 CT A/P which revealed "Enlarged presumed lymph node in the left inguinal region measuring 5.2 x 3.9 x 3.8 cm. There is enhancement of this lymph node. Etiology for this localized lymph node prominence  is uncertain. Tissue sampling may well be warranted given this finding. No other adenopathy noted in the abdomen or pelvis. 2.  Spleen does not appear appreciably enlarged. 3. Wall thickening in the urinary bladder. Suspect a degree of cystitis. No renal or ureteral calculus. No hydronephrosis. 4. Apparent recent ovarian cyst rupture on the right with mild fluid tracking from the right adnexa and cul-de-sac. 5. No evident bowel obstruction or bowel wall thickening. No abscess in the abdomen pelvis. No periappendiceal region inflammatory change." -01/11/2019 PET scan revealed "Bilateral axillary and right subpectoral adenopathy noted with Deauville 5 disease on the left and Deauville 4 disease on the right in this region. Diffuse uniform endometrial activity with SUV of 13.2. Although this is very high for physiologic endometrial activity, the diffuse uniform distribution is more characteristic of benign physiologic endometrial activity rather than malignancy. If the patient has abnormal uterine bleeding or if otherwise clinically warranted, pelvic sonography could be utilized for further characterization of the endometrium. Postoperative findings  and low-grade metabolic activity in the vicinity of the excision biopsy of the left inguinal lymph node. No other hypermetabolic adenopathy in the abdomen/pelvis identified" -01/01/2019 biopsy sampling ([id number]) from the [facility] shows lymphocyte predominant Hodgkin's lymphoma. -04/27/2019 PET/CT scan ([id number]) which revealed "1. Significant response to therapy of lymphoma. Residual small bilateral axillary nodes with low-level hypermetabolism. (Deauville) 2. 2. 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 3. Right ovarian hypermetabolism is likely physiologic and may represent underlying corpus luteal cyst." --No lab or clinical evidence of NLPHL recurrence at this time. Will continue watchful observation.   2. Iron deficiency anemia 2/2 to menstrual bleeding -Currently on Zafemy transdermal patch with improvement of menstrual bleeding.  -Patient received IV venofer x 1 dose on 12/20/2020 but developed infusion reaction that required IV fluids, benadryl, steroids and ED evaluation  PLAN -Labs on 07/09/2021 show hemoglobin still low at 10 with MCV of 71.2 WBC count of 3.5k and platelets of 310k. CMP within normal limits LDH within normal limits at 137. Ferritin less than 4 and iron saturation of 4%. -No clinical or lab evidence of progression of her nodular lymphocyte predominant Hodgkin's lymphoma at this time. -We discussed that she is still very iron deficient and again gave her the option of IV Injectafer with premedications. -She is hesitant to use IV iron given previous reaction. -Urged compliance with taking her Iron polysaccharide to 150mg  po BID with orange juice. -continue Iron rich foods. -continue f/u with ObGyn for mx of heavy periods.  RTC with Dr Irene Limbo with labs in 6 months Follow-up with PCP in 3 months to reevaluate blood tests for iron deficiency  All of the patient's questions were answered with apparent satisfaction.  The patient knows to call the clinic with any problems, questions or concerns.  Brunetta Genera MD ..

## 2021-07-29 NOTE — Progress Notes (Deleted)
Follow Up Note  RE: Leah Gould. Mares MRN: [id number] DOB: [birth year] Date of Office Visit: 07/30/2021  Referring provider: [name], PA Primary care provider: [name], PA  Chief Complaint: No chief complaint on file.  History of Present Illness: I had the pleasure of seeing [name] for a follow up visit at the [facility] on 07/29/2021. She is a 26 y.o. female, who is being followed for adverse drug reaction, allergic rhinoconjunctivitis, oral allergy syndrome, atopic dermatitis and history of asthma. Her previous allergy office visit was on 03/26/2021 with Dr. Maudie Mercury. Today is a regular follow up visit.  Iron infusion reaction Patient has history of iron deficiency anemia and used to receive iron infusions in 2020 with no issues.  Received infusion 2022 with numbness, hand swelling, rash, itching, chest pain.  Treated with Benadryl and Solu-Medrol.  Questionable loss of consciousness with dizziness/vomiting leading to ER visit with observation.  Symptoms completely resolved after 3 days.  Currently tolerates oral iron replacement with no problems. Discussed with patient that there is no skin prick testing available for iron infusions. The fact that she tolerates oral iron pills with no issues makes the iron component less likely to be the culprit. If patient is requiring iron infusions in the future, then recommend that she be referred to allergy at an academic center such as [facility], [facility] or [facility] for further evaluation. We don't offer IV medication drug challenges in our clinic.    Other allergic rhinitis Rhinoconjunctivitis symptoms in the spring.  Patient used to be on allergy injections with good benefit and interested in restarting if able. Today's skin testing was positive to grass, weed, trees, cat and dust mites. Start environmental control measures as below. Use over the counter antihistamines such as Zyrtec (cetirizine), Claritin (loratadine),  Allegra (fexofenadine), or Xyzal (levocetirizine) daily as needed. May take twice a day during allergy flares. May switch antihistamines every few months. Start allergy injections - will send in orders once patient schedules for first injection.  Had a detailed discussion with patient/family that clinical history is suggestive of allergic rhinitis, and may benefit from allergy immunotherapy (AIT). Discussed in detail regarding the dosing, schedule, side effects (mild to moderate local allergic reaction and rarely systemic allergic reactions including anaphylaxis), and benefits (significant improvement in nasal symptoms, seasonal flares of asthma) of immunotherapy with the patient. There is significant time commitment involved with allergy shots, which includes weekly immunotherapy injections for first 9-12 months and then biweekly to monthly injections for 3-5 years. Consent was signed. I have prescribed epinephrine injectable and demonstrated proper use. For mild symptoms you can take over the counter antihistamines such as Benadryl and monitor symptoms closely. If symptoms worsen or if you have severe symptoms including breathing issues, throat closure, significant swelling, whole body hives, severe diarrhea and vomiting, lightheadedness then inject epinephrine and seek immediate medical care afterwards.Emergency action plan given.   Allergic conjunctivitis of both eyes See assessment and plan as above.   History of asthma History of asthma but no symptoms/albuterol use for a few years. Today's spirometry showed some restriction. Monitor breathing symptoms. May use albuterol rescue inhaler 2 puffs every 4 to 6 hours as needed for shortness of breath, chest tightness, coughing, and wheezing. Monitor frequency of use.    Oral allergy syndrome, subsequent encounter Perioral discomfort with plums, peanuts and soy. Today's skin testing showed: Borderline positive to soy. Negative to peanuts.  Continue  to avoid that are bothersome -  plums, peanuts, soy. 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 This is caused by cross reactivity of pollen with fresh fruits and vegetables, and nuts. Symptoms are usually localized in the form of itching and burning in mouth and throat. Very rarely it can progress to more severe symptoms. Eating foods in cooked or processed forms usually minimizes symptoms. I recommended avoidance of eating the problem foods, especially during the peak season(s). Sometimes, OFAS can induce severe throat swelling or even a systemic reaction; with such instance, I advised them to report to a local ER. A list of common pollens and food cross-reactivities was provided to the patient.    Other atopic dermatitis Not controlled. See below for proper skin care. Use triamcinolone 0.1% ointment twice a day as needed for rash flares. Do not use on the face, neck, armpits or groin area. Do not use more than 3 weeks in a row.    07/10/21 heme/onc visit: "1. 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 The sample was sent for a second opinion and is awaiting further characterization for confirmation. -11/17/18 CT A/P which revealed "Enlarged presumed lymph node in the left inguinal region measuring 5.2 x 3.9 x 3.8 cm. There is enhancement of this lymph node. Etiology for this localized lymph node prominence is uncertain. Tissue sampling may well be warranted given this finding. No other adenopathy noted in the abdomen or pelvis. 2.  Spleen does not appear appreciably enlarged. 3. Wall thickening in the urinary bladder. Suspect a degree of cystitis. No renal or ureteral calculus. No hydronephrosis. 4.  Apparent recent ovarian cyst rupture on the right with mild fluid tracking from the right adnexa and cul-de-sac. 5. No evident bowel obstruction or bowel wall thickening. No abscess in the abdomen pelvis. No periappendiceal region inflammatory change." -01/11/2019 PET scan revealed "Bilateral axillary and right subpectoral adenopathy noted with Deauville 5 disease on the left and Deauville 4 disease on the right in this region. Diffuse uniform endometrial activity with SUV of 13.2. Although this is very high for physiologic endometrial activity, the diffuse uniform distribution is more characteristic of benign physiologic endometrial activity rather than malignancy. If the patient has abnormal uterine bleeding or if otherwise clinically warranted, pelvic sonography could be utilized for further characterization of the endometrium. Postoperative findings and low-grade metabolic activity in the vicinity of the excision biopsy of the left inguinal lymph node. No other hypermetabolic adenopathy in the abdomen/pelvis identified" -01/01/2019 biopsy sampling ([id number]) from the [facility] shows lymphocyte predominant Hodgkin's lymphoma. -04/27/2019 PET/CT scan ([id number]) which revealed "1. Significant response to therapy of lymphoma. Residual small bilateral axillary nodes with low-level hypermetabolism. ([place]ville) 2. 2. 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 3. Right ovarian hypermetabolism is likely physiologic and may represent underlying corpus luteal cyst." --No lab or clinical evidence of NLPHL recurrence at this time. Will continue watchful observation.   2. Iron deficiency anemia 2/2 to menstrual bleeding -Currently on Zafemy transdermal patch with improvement of menstrual bleeding.  -Patient received IV venofer x 1 dose on 12/20/2020 but developed infusion reaction that required IV fluids, benadryl, steroids and ED evaluation  PLAN -Labs on  07/09/2021 show hemoglobin still low at 10 with MCV of 71.2 WBC count of  3.5k and platelets of 310k. CMP within normal limits LDH within normal limits at 137. Ferritin less than 4 and iron saturation of 4%. -No clinical or lab evidence of progression of her nodular lymphocyte predominant Hodgkin's lymphoma at this time. -We discussed that she is still very iron deficient and again gave her the option of IV Injectafer with premedications. -She is hesitant to use IV iron given previous reaction. -Urged compliance with taking her Iron polysaccharide to 150mg  po BID with orange juice. -continue Iron rich foods. -continue f/u with ObGyn for mx of heavy periods."  Assessment and Plan: Raejean is a 26 y.o. female with: No problem-specific Assessment & Plan notes found for this encounter.  No follow-ups on file.  No orders of the defined types were placed in this encounter.  Lab Orders  No laboratory test(s) ordered today    Diagnostics: Spirometry:  Tracings reviewed. Her effort: {Blank single:19197::"Good reproducible efforts.","It was hard to get consistent efforts and there is a question as to whether this reflects a maximal maneuver.","Poor effort, data can not be interpreted."} FVC: ***L FEV1: ***L, ***% predicted FEV1/FVC ratio: ***% Interpretation: {Blank single:19197::"Spirometry consistent with mild obstructive disease","Spirometry consistent with moderate obstructive disease","Spirometry consistent with severe obstructive disease","Spirometry consistent with possible restrictive disease","Spirometry consistent with mixed obstructive and restrictive disease","Spirometry uninterpretable due to technique","Spirometry consistent with normal pattern","No overt abnormalities noted given today's efforts"}.  Please see scanned spirometry results for details.  Skin Testing: {Blank single:19197::"Select foods","Environmental allergy panel","Environmental allergy panel and select foods","Food  allergy panel","None","Deferred due to recent antihistamines use"}. *** Results discussed with patient/family.   Medication List:  Current Outpatient Medications  Medication Sig Dispense Refill   albuterol (PROAIR HFA) 108 (90 Base) MCG/ACT inhaler Inhale 2 puffs into the lungs every 4 (four) hours as needed for wheezing or shortness of breath (coughing fits). 18 g 1   EPINEPHrine 0.3 mg/0.3 mL IJ SOAJ injection Inject 0.3 mg into the muscle as needed for anaphylaxis. 1 each 2   iron polysaccharides (NIFEREX) 150 MG capsule Take 1 capsule (150 mg total) by mouth 2 (two) times daily. 60 capsule 5   ondansetron (ZOFRAN) 4 MG tablet Take 1 tablet (4 mg total) by mouth every 6 (six) hours as needed for nausea. 20 tablet 0   triamcinolone ointment (KENALOG) 0.1 % Apply 1 application topically 2 (two) times daily as needed (rash flare). Do not use on the face, neck, armpits or groin area. Do not use more than 3 weeks in a row. 30 g 2   ZAFEMY 150-35 MCG/24HR transdermal patch 1 patch once a week.     No current facility-administered medications for this visit.   Allergies: Allergies  Allergen Reactions   Fruit & Vegetable Daily [Nutritional Supplements] Swelling    Pineapple, plum,    Peanut-Containing Drug Products Swelling   Iron Sucrose Hives   I reviewed her past medical history, social history, family history, and environmental history and no significant changes have been reported from her previous visit.  Review of Systems  Constitutional:  Negative for appetite change, chills, fever and unexpected weight change.  HENT:  Negative for congestion and rhinorrhea.   Eyes:  Negative for itching.  Respiratory:  Negative for cough, chest tightness, shortness of breath and wheezing.   Cardiovascular:  Negative for chest pain.  Gastrointestinal:  Negative for abdominal pain.  Genitourinary:  Negative for difficulty urinating.  Skin:  Positive for rash.  Allergic/Immunologic: Positive for  environmental allergies and food allergies.  Neurological:  Negative  for headaches.   Objective: There were no vitals taken for this visit. There is no height or weight on file to calculate BMI. Physical Exam Vitals and nursing note reviewed.  Constitutional:      Appearance: Normal appearance. She is well-developed.  HENT:     Head: Normocephalic and atraumatic.     Right Ear: External ear normal. There is impacted cerumen.     Left Ear: Tympanic membrane and external ear normal.     Nose: Nose normal.     Mouth/Throat:     Mouth: Mucous membranes are moist.     Pharynx: Oropharynx is clear.  Eyes:     Conjunctiva/sclera: Conjunctivae normal.  Cardiovascular:     Rate and Rhythm: Normal rate and regular rhythm.     Heart sounds: Normal heart sounds. No murmur heard.   No friction rub. No gallop.  Pulmonary:     Effort: Pulmonary effort is normal.     Breath sounds: Normal breath sounds. No wheezing, rhonchi or rales.  Musculoskeletal:     Cervical back: Neck supple.  Skin:    General: Skin is warm.     Findings: Rash present.     Comments: Dry patch on right thumb. Hyperpigmented patched on right antecubital fossa area.  Neurological:     Mental Status: She is alert and oriented to person, place, and time.  Psychiatric:        Behavior: Behavior normal.   Previous notes and tests were reviewed. The plan was reviewed with the patient/family, and all questions/concerned were addressed.  It was my pleasure to see Keriana today and participate in her care. Please feel free to contact me with any questions or concerns.  Sincerely,  Rexene Alberts, DO Allergy & Immunology  [facility] office: [place] office: [phone]

## 2021-07-30 ENCOUNTER — Ambulatory Visit: Payer: Medicaid Other | Admitting: Allergy

## 2021-07-30 DIAGNOSIS — T50995D Adverse effect of other drugs, medicaments and biological substances, subsequent encounter: Secondary | ICD-10-CM

## 2021-07-30 DIAGNOSIS — Z8709 Personal history of other diseases of the respiratory system: Secondary | ICD-10-CM

## 2021-07-30 DIAGNOSIS — L2089 Other atopic dermatitis: Secondary | ICD-10-CM

## 2021-07-30 DIAGNOSIS — H1010 Acute atopic conjunctivitis, unspecified eye: Secondary | ICD-10-CM

## 2021-07-30 DIAGNOSIS — T781XXD Other adverse food reactions, not elsewhere classified, subsequent encounter: Secondary | ICD-10-CM

## 2021-08-10 LAB — OB RESULTS CONSOLE GC/CHLAMYDIA
Chlamydia: NEGATIVE
Neisseria Gonorrhea: NEGATIVE

## 2021-08-10 LAB — OB RESULTS CONSOLE HIV ANTIBODY (ROUTINE TESTING): HIV: NONREACTIVE

## 2021-08-10 LAB — HEPATITIS C ANTIBODY: HCV Ab: NEGATIVE

## 2021-08-10 LAB — OB RESULTS CONSOLE RUBELLA ANTIBODY, IGM: Rubella: IMMUNE

## 2021-08-10 LAB — OB RESULTS CONSOLE HEPATITIS B SURFACE ANTIGEN: Hepatitis B Surface Ag: NEGATIVE

## 2021-08-29 ENCOUNTER — Other Ambulatory Visit: Payer: Self-pay | Admitting: Obstetrics and Gynecology

## 2021-08-29 ENCOUNTER — Other Ambulatory Visit (HOSPITAL_COMMUNITY)
Admission: RE | Admit: 2021-08-29 | Discharge: 2021-08-29 | Disposition: A | Discharge status: Home / Self Care | Payer: Medicaid Other | Source: Ambulatory Visit | Attending: Obstetrics and Gynecology | Admitting: Obstetrics and Gynecology

## 2021-08-29 DIAGNOSIS — Z3490 Encounter for supervision of normal pregnancy, unspecified, unspecified trimester: Secondary | ICD-10-CM | POA: Diagnosis not present

## 2021-09-03 LAB — CYTOLOGY - PAP: Diagnosis: NEGATIVE

## 2021-10-31 ENCOUNTER — Encounter (HOSPITAL_COMMUNITY): Payer: Self-pay | Admitting: *Deleted

## 2021-10-31 ENCOUNTER — Inpatient Hospital Stay (HOSPITAL_COMMUNITY)
Admission: AD | Admit: 2021-10-31 | Discharge: 2021-10-31 | Disposition: A | Discharge status: Home / Self Care | Payer: Medicaid Other | Attending: Obstetrics and Gynecology | Admitting: Obstetrics and Gynecology

## 2021-10-31 DIAGNOSIS — R1032 Left lower quadrant pain: Secondary | ICD-10-CM | POA: Insufficient documentation

## 2021-10-31 DIAGNOSIS — R102 Pelvic and perineal pain: Secondary | ICD-10-CM

## 2021-10-31 DIAGNOSIS — O26899 Other specified pregnancy related conditions, unspecified trimester: Secondary | ICD-10-CM

## 2021-10-31 DIAGNOSIS — O99612 Diseases of the digestive system complicating pregnancy, second trimester: Secondary | ICD-10-CM | POA: Diagnosis not present

## 2021-10-31 DIAGNOSIS — O26892 Other specified pregnancy related conditions, second trimester: Secondary | ICD-10-CM | POA: Insufficient documentation

## 2021-10-31 DIAGNOSIS — M7918 Myalgia, other site: Secondary | ICD-10-CM | POA: Diagnosis not present

## 2021-10-31 DIAGNOSIS — K5900 Constipation, unspecified: Secondary | ICD-10-CM | POA: Insufficient documentation

## 2021-10-31 DIAGNOSIS — O99891 Other specified diseases and conditions complicating pregnancy: Secondary | ICD-10-CM | POA: Diagnosis not present

## 2021-10-31 DIAGNOSIS — R519 Headache, unspecified: Secondary | ICD-10-CM | POA: Diagnosis not present

## 2021-10-31 DIAGNOSIS — Z3A19 19 weeks gestation of pregnancy: Secondary | ICD-10-CM | POA: Insufficient documentation

## 2021-10-31 LAB — CBC
HCT: 29.7 % — ABNORMAL LOW (ref 36.0–46.0)
Hemoglobin: 9 g/dL — ABNORMAL LOW (ref 12.0–15.0)
MCH: 20.7 pg — ABNORMAL LOW (ref 26.0–34.0)
MCHC: 30.3 g/dL (ref 30.0–36.0)
MCV: 68.4 fL — ABNORMAL LOW (ref 80.0–100.0)
Platelets: 215 10*3/uL (ref 150–400)
RBC: 4.34 MIL/uL (ref 3.87–5.11)
RDW: 18.3 % — ABNORMAL HIGH (ref 11.5–15.5)
WBC: 7.2 10*3/uL (ref 4.0–10.5)
nRBC: 0 % (ref 0.0–0.2)

## 2021-10-31 LAB — COMPREHENSIVE METABOLIC PANEL
ALT: 13 U/L (ref 0–44)
AST: 21 U/L (ref 15–41)
Albumin: 3 g/dL — ABNORMAL LOW (ref 3.5–5.0)
Alkaline Phosphatase: 63 U/L (ref 38–126)
Anion gap: 5 (ref 5–15)
BUN: 6 mg/dL (ref 6–20)
CO2: 24 mmol/L (ref 22–32)
Calcium: 9 mg/dL (ref 8.9–10.3)
Chloride: 107 mmol/L (ref 98–111)
Creatinine, Ser: 0.51 mg/dL (ref 0.44–1.00)
GFR, Estimated: >60 mL/min (ref >60)
Glucose, Bld: 89 mg/dL (ref 70–99)
Potassium: 3.7 mmol/L (ref 3.5–5.1)
Sodium: 136 mmol/L (ref 135–145)
Total Bilirubin: 0.2 mg/dL — ABNORMAL LOW (ref 0.3–1.2)
Total Protein: 6.1 g/dL — ABNORMAL LOW (ref 6.5–8.1)

## 2021-10-31 LAB — URINALYSIS, ROUTINE W REFLEX MICROSCOPIC
Bilirubin Urine: NEGATIVE
Glucose, UA: NEGATIVE mg/dL
Hgb urine dipstick: NEGATIVE
Ketones, ur: NEGATIVE mg/dL
Leukocytes,Ua: NEGATIVE
Nitrite: NEGATIVE
Protein, ur: NEGATIVE mg/dL
Specific Gravity, Urine: 1.012 (ref 1.005–1.030)
pH: 7 (ref 5.0–8.0)

## 2021-10-31 MED ORDER — CYCLOBENZAPRINE HCL 5 MG PO TABS
5.0000 mg | ORAL_TABLET | Freq: Three times a day (TID) | ORAL | 0 refills | Status: DC | PRN
Start: 2021-10-31 — End: 2022-03-14

## 2021-10-31 NOTE — MAU Note (Signed)
Leah Gould is a 26 y.o. here in MAU reporting: cramping on left side from abd to her knee, started this morning.  Just had a mild migraine since Saturday, is very faint now. No bleeding. Care at [facility] ? ?Onset of complaint: HA- Sat, abd today ?Pain score: HA 5; abd 7 ?Vitals:  ? 10/31/21 1159  ?BP: 99/66  ?Pulse: 87  ?Resp: 16  ?Temp: 98.2 ?F (36.8 ?C)  ?SpO2: 100%  ?   ?FHT:150 ?Lab orders placed from triage:  urine ?

## 2021-10-31 NOTE — MAU Provider Note (Signed)
Chief Complaint: Abdominal Pain and Headache ? ? None  ?  ? ?SUBJECTIVE ?HPI: Leah Gould. Allemand is a 26 y.o. G3P1011 at 13w0dby LMP who presents to maternity admissions reporting LLQ pain starting at work today and h/a x 4 days improving today.  She reports constipation with bowel movement yesterday that was hard with straining. She takes iron supplement for severe anemia.  She had reaction to IV iron and declines additional IV iron. ? ?HPI ? ?Past Medical History:  ?Diagnosis Date  ? Anemia   ? Asthma   ? no meds, allergy induced  ? Hodgkin's [place] ([place]   ? Inguinal adenopathy 12/22/2018  ? ?Past Surgical History:  ?Procedure Laterality Date  ? MASS EXCISION Left 12/22/2018  ? Procedure: EXCISION DEEP LEFT INGUINAL LYMPH NODE;  Surgeon: IFanny Skates MD;  [facility]  Service: General;  Laterality: Left;  ? ?Social History  ? ?Socioeconomic History  ? Marital status: Single  ?  Spouse name: Not on file  ? Number of children: Not on file  ? Years of education: Not on file  ? Highest education level: Not on file  ?Occupational History  ? Not on file  ?Tobacco Use  ? Smoking status: Never  ? Smokeless tobacco: Never  ?Vaping Use  ? Vaping Use: Never used  ?Substance and Sexual Activity  ? Alcohol use: No  ? Drug use: No  ? Sexual activity: Yes  ?  Birth control/protection: None  ?Other Topics Concern  ? Not on file  ?Social History Narrative  ? Not on file  ? ?Social Determinants of Health  ? ?Financial Resource Strain: Not on file  ?Food Insecurity: Not on file  ?Transportation Needs: Not on file  ?Physical Activity: Not on file  ?Stress: Not on file  ?Social Connections: Not on file  ?Intimate Partner Violence: Not on file  ? ?No current facility-administered medications on file prior to encounter.  ? ?Current Outpatient Medications on File Prior to Encounter  ?Medication Sig Dispense Refill  ? iron polysaccharides (NIFEREX) 150 MG capsule Take 1 capsule (150 mg total) by mouth 2 (two)  times daily. 60 capsule 5  ? Prenatal Vit-Fe Fumarate-FA (PRENATAL MULTIVITAMIN) TABS tablet Take 1 tablet by mouth daily at 12 noon.    ? triamcinolone ointment (KENALOG) 0.1 % Apply 1 application topically 2 (two) times daily as needed (rash flare). Do not use on the face, neck, armpits or groin area. Do not use more than 3 weeks in a row. 30 g 2  ? albuterol (PROAIR HFA) 108 (90 Base) MCG/ACT inhaler Inhale 2 puffs into the lungs every 4 (four) hours as needed for wheezing or shortness of breath (coughing fits). 18 g 1  ? EPINEPHrine 0.3 mg/0.3 mL IJ SOAJ injection Inject 0.3 mg into the muscle as needed for anaphylaxis. 1 each 2  ? ondansetron (ZOFRAN) 4 MG tablet Take 1 tablet (4 mg total) by mouth every 6 (six) hours as needed for nausea. (Patient not taking: Reported on 10/31/2021) 20 tablet 0  ? ZAFEMY 150-35 MCG/24HR transdermal patch 1 patch once a week.    ? ?Allergies  ?Allergen Reactions  ? Fruit & Vegetable Daily [Nutritional Supplements] Swelling  ?  Pineapple, plum,   ? Peanut-Containing Drug Products Swelling  ? Iron Sucrose Hives  ? ? ?ROS:  ?Review of Systems  ?Constitutional:  Negative for chills, fatigue and fever.  ?Respiratory:  Negative for shortness of breath.   ?Cardiovascular:  Negative for chest  pain.  ?Gastrointestinal:  Positive for abdominal pain and constipation.  ?Genitourinary:  Negative for difficulty urinating, dysuria, flank pain, pelvic pain, vaginal bleeding, vaginal discharge and vaginal pain.  ?Neurological:  Positive for headaches. Negative for dizziness.  ?Psychiatric/Behavioral: Negative.    ? ? ?I have reviewed patient's Past Medical Hx, Surgical Hx, Family Hx, Social Hx, medications and allergies.  ? ?Physical Exam  ?Patient Vitals for the past 24 hrs: ? BP Temp Temp src Pulse Resp SpO2 Height Weight  ?10/31/21 1159 99/66 98.2 ?F (36.8 ?C) Oral 87 16 100 % '5\' 3"'$  (1.6 m) 52.1 kg  ? ?Constitutional: Well-developed, well-nourished female in no acute distress.   ?Cardiovascular: normal rate ?Respiratory: normal effort ?GI: Abd soft, non-tender. Pos BS x 4 ?MS: Extremities nontender, no edema, normal ROM ?Neurologic: Alert and oriented x 4.  ?GU: Neg CVAT. ? ?Dilation: Closed ?Effacement (%): Thick ?Cervical Position: Posterior ?Exam by:: Fatima Blank, CNM ? ?FHT 150 by doppler ? ?LAB RESULTS ?Results for orders placed or performed during the hospital encounter of 10/31/21 (from the past 24 hour(s))  ?Urinalysis, Routine w reflex microscopic Urine, Clean Catch     Status: None  ? Collection Time: 10/31/21 12:08 PM  ?Result Value Ref Range  ? Color, Urine YELLOW YELLOW  ? APPearance CLEAR CLEAR  ? Specific Gravity, Urine 1.012 1.005 - 1.030  ? pH 7.0 5.0 - 8.0  ? Glucose, UA NEGATIVE NEGATIVE mg/dL  ? Hgb urine dipstick NEGATIVE NEGATIVE  ? Bilirubin Urine NEGATIVE NEGATIVE  ? Ketones, ur NEGATIVE NEGATIVE mg/dL  ? Protein, ur NEGATIVE NEGATIVE mg/dL  ? Nitrite NEGATIVE NEGATIVE  ? Leukocytes,Ua NEGATIVE NEGATIVE  ?CBC     Status: Abnormal  ? Collection Time: 10/31/21  1:35 PM  ?Result Value Ref Range  ? WBC 7.2 4.0 - 10.5 K/uL  ? RBC 4.34 3.87 - 5.11 MIL/uL  ? Hemoglobin 9.0 (L) 12.0 - 15.0 g/dL  ? HCT 29.7 (L) 36.0 - 46.0 %  ? MCV 68.4 (L) 80.0 - 100.0 fL  ? MCH 20.7 (L) 26.0 - 34.0 pg  ? MCHC 30.3 30.0 - 36.0 g/dL  ? RDW 18.3 (H) 11.5 - 15.5 %  ? Platelets 215 150 - 400 K/uL  ? nRBC 0.0 0.0 - 0.2 %  ? ? ?  ? ?IMAGING ?No results found. ? ?MAU Management/MDM: ?Orders Placed This Encounter  ?Procedures  ? Culture, OB Urine  ? Urinalysis, Routine w reflex microscopic Urine, Clean Catch  ? CBC  ? Comprehensive metabolic panel  ? Discharge patient  ?  ?Meds ordered this encounter  ?Medications  ? cyclobenzaprine (FLEXERIL) 5 MG tablet  ?  Sig: Take 1-2 tablets (5-10 mg total) by mouth 3 (three) times daily as needed for muscle spasms.  ?  Dispense:  30 tablet  ?  Refill:  0  ?  Order Specific Question:   Supervising Provider  ?  Answer:   Verita Schneiders A [[id number]]  ?   ?Cervix closed, no evidence of PTL ?UA wnl, but sent for culture ?Pain c/w MSK pain, likely worsened by constipation ?Pt wearing support belt, drinking water ?Rx for Flexeril for PRN use, pt to try caffeine plus Tylenol at home if h/a returns ?F/U at [place] as scheduled, return to MAU as needed for emergencies ? ? ?ASSESSMENT ?1. Pain of round ligament affecting pregnancy, antepartum   ?2. Musculoskeletal pain   ?3. Constipation during pregnancy in second trimester   ?4. [redacted] weeks gestation of pregnancy   ?  5. Pregnancy headache in second trimester   ? ? ?PLAN ?Discharge home ?Allergies as of 10/31/2021   ? ?   Reactions  ? Fruit & Vegetable Daily [nutritional Supplements] Swelling  ? Pineapple, plum,   ? Peanut-containing Drug Products Swelling  ? Iron Sucrose Hives  ? ?  ? ?  ?Medication List  ?  ? ?TAKE these medications   ? ?albuterol 108 (90 Base) MCG/ACT inhaler ?Commonly known as: ProAir HFA ?Inhale 2 puffs into the lungs every 4 (four) hours as needed for wheezing or shortness of breath (coughing fits). ?  ?cyclobenzaprine 5 MG tablet ?Commonly known as: FLEXERIL ?Take 1-2 tablets (5-10 mg total) by mouth 3 (three) times daily as needed for muscle spasms. ?  ?EPINEPHrine 0.3 mg/0.3 mL Soaj injection ?Commonly known as: EPI-PEN ?Inject 0.3 mg into the muscle as needed for anaphylaxis. ?  ?iron polysaccharides 150 MG capsule ?Commonly known as: NIFEREX ?Take 1 capsule (150 mg total) by mouth 2 (two) times daily. ?  ?ondansetron 4 MG tablet ?Commonly known as: ZOFRAN ?Take 1 tablet (4 mg total) by mouth every 6 (six) hours as needed for nausea. ?  ?prenatal multivitamin Tabs tablet ?Take 1 tablet by mouth daily at 12 noon. ?  ?triamcinolone ointment 0.1 % ?Commonly known as: KENALOG ?Apply 1 application topically 2 (two) times daily as needed (rash flare). Do not use on the face, neck, armpits or groin area. Do not use more than 3 weeks in a row. ?  ?Zafemy 150-35 MCG/24HR transdermal patch ?Generic drug:  norelgestromin-ethinyl estradiol ?1 patch once a week. ?  ? ?  ? ? Follow-up Information   ? ? Gynecology, [place] Follow up.   ?Specialty: Obstetrics and Gynecology ?Why: As scheduled ?Contact information:

## 2021-11-01 LAB — CULTURE, OB URINE: Culture: <10000 — AB

## 2021-12-28 ENCOUNTER — Telehealth: Payer: Self-pay | Admitting: Hematology

## 2021-12-28 NOTE — Telephone Encounter (Signed)
Rescheduled upcoming appointment due to provider's PAL. Patient is aware of changes. ?

## 2022-01-07 ENCOUNTER — Ambulatory Visit: Payer: Medicaid Other | Admitting: Hematology

## 2022-01-07 ENCOUNTER — Other Ambulatory Visit: Payer: Medicaid Other

## 2022-02-08 ENCOUNTER — Inpatient Hospital Stay (HOSPITAL_BASED_OUTPATIENT_CLINIC_OR_DEPARTMENT_OTHER): Payer: Medicaid Other | Admitting: Hematology

## 2022-02-08 ENCOUNTER — Inpatient Hospital Stay: Payer: Medicaid Other | Attending: Hematology and Oncology

## 2022-02-08 ENCOUNTER — Other Ambulatory Visit: Payer: Self-pay | Admitting: *Deleted

## 2022-02-08 ENCOUNTER — Other Ambulatory Visit: Payer: Self-pay

## 2022-02-08 VITALS — BP 117/62 | HR 94 | Temp 97.9°F | Resp 20 | Wt 135.2 lb

## 2022-02-08 DIAGNOSIS — O26893 Other specified pregnancy related conditions, third trimester: Secondary | ICD-10-CM | POA: Diagnosis present

## 2022-02-08 DIAGNOSIS — Z79899 Other long term (current) drug therapy: Secondary | ICD-10-CM | POA: Insufficient documentation

## 2022-02-08 DIAGNOSIS — D500 Iron deficiency anemia secondary to blood loss (chronic): Secondary | ICD-10-CM

## 2022-02-08 DIAGNOSIS — Z3A33 33 weeks gestation of pregnancy: Secondary | ICD-10-CM | POA: Insufficient documentation

## 2022-02-08 DIAGNOSIS — C8108 Nodular lymphocyte predominant Hodgkin lymphoma, lymph nodes of multiple sites: Secondary | ICD-10-CM | POA: Diagnosis present

## 2022-02-08 LAB — CMP (CANCER CENTER ONLY)
ALT: 13 U/L (ref 0–44)
AST: 22 U/L (ref 15–41)
Albumin: 3.5 g/dL (ref 3.5–5.0)
Alkaline Phosphatase: 178 U/L — ABNORMAL HIGH (ref 38–126)
Anion gap: 5 (ref 5–15)
BUN: 8 mg/dL (ref 6–20)
CO2: 25 mmol/L (ref 22–32)
Calcium: 8.7 mg/dL — ABNORMAL LOW (ref 8.9–10.3)
Chloride: 105 mmol/L (ref 98–111)
Creatinine: 0.57 mg/dL (ref 0.44–1.00)
GFR, Estimated: >60 mL/min (ref >60)
Glucose, Bld: 83 mg/dL (ref 70–99)
Potassium: 3.8 mmol/L (ref 3.5–5.1)
Sodium: 135 mmol/L (ref 135–145)
Total Bilirubin: 0.5 mg/dL (ref 0.3–1.2)
Total Protein: 6.2 g/dL — ABNORMAL LOW (ref 6.5–8.1)

## 2022-02-08 LAB — CBC WITH DIFFERENTIAL/PLATELET
Abs Immature Granulocytes: 0.03 10*3/uL (ref 0.00–0.07)
Basophils Absolute: 0 10*3/uL (ref 0.0–0.1)
Basophils Relative: 1 %
Eosinophils Absolute: 0.1 10*3/uL (ref 0.0–0.5)
Eosinophils Relative: 1 %
HCT: 27.9 % — ABNORMAL LOW (ref 36.0–46.0)
Hemoglobin: 8.2 g/dL — ABNORMAL LOW (ref 12.0–15.0)
Immature Granulocytes: 1 %
Lymphocytes Relative: 19 %
Lymphs Abs: 1.3 10*3/uL (ref 0.7–4.0)
MCH: 18.9 pg — ABNORMAL LOW (ref 26.0–34.0)
MCHC: 29.4 g/dL — ABNORMAL LOW (ref 30.0–36.0)
MCV: 64.4 fL — ABNORMAL LOW (ref 80.0–100.0)
Monocytes Absolute: 0.5 10*3/uL (ref 0.1–1.0)
Monocytes Relative: 8 %
Neutro Abs: 4.7 10*3/uL (ref 1.7–7.7)
Neutrophils Relative %: 70 %
Platelets: 213 10*3/uL (ref 150–400)
RBC: 4.33 MIL/uL (ref 3.87–5.11)
RDW: 22.4 % — ABNORMAL HIGH (ref 11.5–15.5)
WBC: 6.6 10*3/uL (ref 4.0–10.5)
nRBC: 0.3 % — ABNORMAL HIGH (ref 0.0–0.2)

## 2022-02-08 LAB — IRON AND IRON BINDING CAPACITY (CC-WL,HP ONLY)
Iron: 27 ug/dL — ABNORMAL LOW (ref 28–170)
Saturation Ratios: 5 % — ABNORMAL LOW (ref 10.4–31.8)
TIBC: 596 ug/dL — ABNORMAL HIGH (ref 250–450)
UIBC: 569 ug/dL — ABNORMAL HIGH (ref 148–442)

## 2022-02-08 LAB — FERRITIN: Ferritin: 5 ng/mL — ABNORMAL LOW (ref 11–307)

## 2022-02-08 NOTE — Progress Notes (Unsigned)
HEMATOLOGY/ONCOLOGY CLINIC VISIT NOTE  Date of Service: .02/08/2022   Patient Care Team: [name], PA as PCP - General (Family Medicine)   CHIEF COMPLAINTS: -nodular Lymphocyte predominant hodgkins lymphoma -Iron deficiency anemia  INTERVAL HISTORY:  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 Her last visit with [place] was in January 2023. At the time she was significantly iron deficient but declined IV iron due to previous reaction with hives with IV Venofer. She notes that she has not been compliant with her oral iron either. She is now again pregnant and is at 33 weeks pregnancy. She notes she has been feeling quite fatigued and her hemoglobin is down to 8.2 with a ferritin of 5 and iron saturation of 5%. She notes no other evidence of bleeding at this time. Labs done were reviewed with her in detail.  MEDICAL HISTORY:  Past Medical History:  Diagnosis Date   Anemia    Asthma    no meds, allergy induced   Hodgkin's lymphoma ([place])    Inguinal adenopathy 12/22/2018    SURGICAL HISTORY: Past Surgical History:  Procedure Laterality Date   MASS EXCISION Left 12/22/2018   Procedure: EXCISION DEEP LEFT INGUINAL LYMPH NODE;  Surgeon: Fanny Skates, MD;  Location: [facility];  Service: General;  Laterality: Left;    SOCIAL HISTORY: Social History   Socioeconomic History   Marital status: Single    Spouse name: Not on file   Number of children: Not on file   Years of education: Not on file   Highest education level: Not on file  Occupational History   Not on file  Tobacco Use   Smoking status: Never   Smokeless tobacco: Never  Vaping Use   Vaping Use: Never used  Substance and Sexual Activity   Alcohol use: No   Drug use: No   Sexual activity: Yes    Birth control/protection: None  Other Topics Concern   Not on file  Social  History Narrative   Not on file   Social Determinants of Health   Financial Resource Strain: Not on file  Food Insecurity: Not on file  Transportation Needs: Not on file  Physical Activity: Not on file  Stress: Not on file  Social Connections: Not on file  Intimate Partner Violence: Not on file    FAMILY HISTORY: No family history on file.  ALLERGIES:  is allergic to fruit & vegetable daily [nutritional supplements], peanut-containing drug products, and iron sucrose.  MEDICATIONS:  Current Outpatient Medications  Medication Sig Dispense Refill   albuterol (PROAIR HFA) 108 (90 Base) MCG/ACT inhaler Inhale 2 puffs into the lungs every 4 (four) hours as needed for wheezing or shortness of breath (coughing fits). 18 g 1   cyclobenzaprine (FLEXERIL) 5 MG tablet Take 1-2 tablets (5-10 mg total) by mouth 3 (three) times daily as needed for muscle spasms. 30 tablet 0   EPINEPHrine 0.3 mg/0.3 mL IJ SOAJ injection Inject 0.3 mg into the muscle as needed for anaphylaxis. 1 each 2   iron polysaccharides (NIFEREX) 150 MG capsule Take 1 capsule (150 mg total) by mouth 2 (two) times daily. 60 capsule 5   ondansetron (ZOFRAN) 4 MG tablet Take 1 tablet (4 mg total) by mouth every 6 (six) hours as needed for nausea. (Patient not taking: Reported on 10/31/2021) 20 tablet 0   Prenatal Vit-Fe Fumarate-FA (PRENATAL MULTIVITAMIN) TABS tablet Take 1 tablet  by mouth daily at 12 noon.     triamcinolone ointment (KENALOG) 0.1 % Apply 1 application topically 2 (two) times daily as needed (rash flare). Do not use on the face, neck, armpits or groin area. Do not use more than 3 weeks in a row. 30 g 2   ZAFEMY 150-35 MCG/24HR transdermal patch 1 patch once a week.     No current facility-administered medications for this visit.    REVIEW OF SYSTEMS:   10 Point review of Systems was done is negative except as noted above.  PHYSICAL EXAMINATION: .BP 117/62   Pulse 94   Temp 97.9 F (36.6 C)   Resp 20   Wt  135 lb 3.2 oz (61.3 kg)   SpO2 100%   BMI 23.95 kg/m  . GENERAL:alert, in no acute distress and comfortable SKIN: no acute rashes, no significant lesions EYES: conjunctiva are pink and non-injected, sclera anicteric OROPHARYNX: MMM, no exudates, no oropharyngeal erythema or ulceration NECK: supple, no JVD LYMPH:  no palpable lymphadenopathy in the cervical, axillary or inguinal regions LUNGS: clear to auscultation b/l with normal respiratory effort HEART: regular rate & rhythm ABDOMEN:  normoactive bowel sounds , gravid uterus noted Extremity: no pedal edema PSYCH: alert & oriented x 3 with fluent speech NEURO: no focal motor/sensory deficits   LABORATORY DATA:  I have reviewed the data as listed     Latest Ref Rng & Units 02/08/2022   12:12 PM 10/31/2021    1:35 PM 07/09/2021   10:42 AM  CBC  WBC 4.0 - 10.5 K/uL 6.6  7.2  3.5   Hemoglobin 12.0 - 15.0 g/dL 8.2  9.0  10.0   Hematocrit 36.0 - 46.0 % 27.9  29.7  32.7   Platelets 150 - 400 K/uL 213  215  310       Latest Ref Rng & Units 02/08/2022   12:12 PM 10/31/2021    1:35 PM 07/09/2021   10:42 AM  CMP  Glucose 70 - 99 mg/dL 83  89  93   BUN 6 - 20 mg/dL '8  6  9   '$ Creatinine 0.44 - 1.00 mg/dL 0.57  0.51  0.79   Sodium 135 - 145 mmol/L 135  136  139   Potassium 3.5 - 5.1 mmol/L 3.8  3.7  3.5   Chloride 98 - 111 mmol/L 105  107  107   CO2 22 - 32 mmol/L '25  24  26   '$ Calcium 8.9 - 10.3 mg/dL 8.7  9.0  9.4   Total Protein 6.5 - 8.1 g/dL 6.2  6.1  7.1   Total Bilirubin 0.3 - 1.2 mg/dL 0.5  0.2  0.6   Alkaline Phos 38 - 126 U/L 178  63  55   AST 15 - 41 U/L '22  21  18   '$ ALT 0 - 44 U/L '13  13  20    '$ . Lab Results  Component Value Date   IRON 27 (L) 02/08/2022   TIBC 596 (H) 02/08/2022   IRONPCTSAT 5 (L) 02/08/2022   (Iron and TIBC)  Lab Results  Component Value Date   FERRITIN 5 (L) 02/08/2022   . Lab Results  Component Value Date   LDH 137 07/09/2021    RADIOGRAPHIC STUDIES: No images were reviewed during this  visit.  ASSESSMENT & PLAN:   1. 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 The sample was sent for a second opinion and is awaiting further characterization for confirmation. -11/17/18 CT A/P which revealed "Enlarged presumed lymph node in the left inguinal region measuring 5.2 x 3.9 x 3.8 cm. There is enhancement of this lymph node. Etiology for this localized lymph node prominence is uncertain. Tissue sampling may well be warranted given this finding. No other adenopathy noted in the abdomen or pelvis. 2.  Spleen does not appear appreciably enlarged. 3. Wall thickening in the urinary bladder. Suspect a degree of cystitis. No renal or ureteral calculus. No hydronephrosis. 4. Apparent recent ovarian cyst rupture on the right with mild fluid tracking from the right adnexa and cul-de-sac. 5. No evident bowel obstruction or bowel wall thickening. No abscess in the abdomen pelvis. No periappendiceal region inflammatory change." -01/11/2019 PET scan revealed "Bilateral axillary and right subpectoral adenopathy noted with Deauville 5 disease on the left and Deauville 4 disease on the right in this region. Diffuse uniform endometrial activity with SUV of 13.2. Although this is very high for physiologic endometrial activity, the diffuse uniform distribution is more characteristic of benign physiologic endometrial activity rather than malignancy. If the patient has abnormal uterine bleeding or if otherwise clinically warranted, pelvic sonography could be utilized for further characterization of the endometrium. Postoperative findings and low-grade metabolic activity in the vicinity of the excision biopsy of the left inguinal lymph node. No other hypermetabolic adenopathy in the  abdomen/pelvis identified" -01/01/2019 biopsy sampling ([id number]) from the [facility] shows lymphocyte predominant Hodgkin's lymphoma. -04/27/2019 PET/CT scan ([id number]) which revealed "1. Significant response to therapy of lymphoma. Residual small bilateral axillary nodes with low-level hypermetabolism. ([place]ville) 2. 2. 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 3. Right ovarian hypermetabolism is likely physiologic and may represent underlying corpus luteal cyst." --No lab or clinical evidence of NLPHL recurrence at this time. Will continue watchful observation.   2. Iron deficiency anemia 2/2 to menstrual bleeding -Currently on Zafemy transdermal patch with improvement of menstrual bleeding.  -Patient received IV venofer x 1 dose on 12/20/2020 but developed infusion reaction that required IV fluids, benadryl, steroids and ED evaluation  PLAN Patient is again pregnant and is at [redacted] weeks gestation at this time. -She has no clinical signs or symptoms of Hodgkin's lymphoma progression at this time. -She had declined IV iron previously in January 2023 and notes that she has not been very compliant with her oral iron polysaccharide. -She has not gotten progressively anemic and more iron deficient in the context of previous iron deficiency from menorrhagia and increasing iron deficiency due to increased needs of pregnancy and from noncompliance with her p.o. iron. -She notes that she will start taking her p.o. iron polysaccharide 150 mg p.o. twice daily with orange juice. -We discussed that if she gets significantly lightheaded or dizzy she should call her OB/GYN doctors for consideration of possible PRBC transfusion. -We discussed pros and cons of using IV Injectafer with premedications to correct her severe iron deficiency to reduce her need for blood transfusions and given her high risk of becoming more anemic as she gets closer to  term. -Patient previously reacted to Venofer and we cannot use this again. -Patient is agreeable and we should set her up for 1 dose of IV Injectafer 750 mg with Tylenol Claritin and prednisone as premedications. -Continue prenatal vitamins -Her infusion will need to be done at [facility]. She will need close follow-up with her OB/GYN team.  Follow-up IV Injectafer '750mg'$  x 1 dose at [facility] day center (Patient [redacted]weeks pregnant) RTC with Dr Irene Limbo with labs in 4 weeks  The total time spent in the appointment was 30 minutes*.  All of the patient's questions were answered with apparent satisfaction. The patient knows to call the clinic with any problems, questions or concerns.   Sullivan Lone MD MS AAHIVMS [facility] Hematology/Oncology Physician [facility]  .*Total Encounter Time as defined by the Centers for Medicare and Medicaid Services includes, in addition to the face-to-face time of a patient visit (documented in the note above) non-face-to-face time: obtaining and reviewing outside history, ordering and reviewing medications, tests or procedures, care coordination (communications with other health care professionals or caregivers) and documentation in the medical record.

## 2022-02-11 ENCOUNTER — Telehealth: Payer: Self-pay | Admitting: Hematology

## 2022-02-11 NOTE — Telephone Encounter (Signed)
Scheduled follow-up appointment per 8/11 los. Patient is aware. 

## 2022-02-21 ENCOUNTER — Ambulatory Visit (HOSPITAL_COMMUNITY)
Admission: RE | Admit: 2022-02-21 | Discharge: 2022-02-21 | Disposition: A | Discharge status: Home / Self Care | Payer: Medicaid Other | Source: Ambulatory Visit | Attending: Hematology | Admitting: Hematology

## 2022-02-21 DIAGNOSIS — D500 Iron deficiency anemia secondary to blood loss (chronic): Secondary | ICD-10-CM | POA: Insufficient documentation

## 2022-02-21 MED ORDER — PREDNISONE 20 MG PO TABS
ORAL_TABLET | ORAL | Status: AC
  Filled 2022-02-21: qty 1

## 2022-02-21 MED ORDER — LORATADINE 10 MG PO TABS
ORAL_TABLET | ORAL | Status: AC
  Filled 2022-02-21: qty 1

## 2022-02-21 MED ORDER — ACETAMINOPHEN 325 MG PO TABS
650.0000 mg | ORAL_TABLET | Freq: Four times a day (QID) | ORAL | Status: DC | PRN
  Administered 2022-02-21: 650 mg via ORAL

## 2022-02-21 MED ORDER — LORATADINE 10 MG PO TABS
10.0000 mg | ORAL_TABLET | Freq: Once | ORAL | Status: AC
  Administered 2022-02-21: 10 mg via ORAL

## 2022-02-21 MED ORDER — SODIUM CHLORIDE 0.9 % IV SOLN
750.0000 mg | Freq: Once | INTRAVENOUS | Status: AC
  Administered 2022-02-21: 750 mg via INTRAVENOUS
  Filled 2022-02-21: qty 15

## 2022-02-21 MED ORDER — ACETAMINOPHEN 325 MG PO TABS
ORAL_TABLET | ORAL | Status: AC
  Filled 2022-02-21: qty 2

## 2022-02-21 MED ORDER — PREDNISONE 20 MG PO TABS
20.0000 mg | ORAL_TABLET | Freq: Once | ORAL | Status: AC
  Administered 2022-02-21: 20 mg via ORAL

## 2022-02-26 LAB — OB RESULTS CONSOLE GC/CHLAMYDIA
Chlamydia: NEGATIVE
Neisseria Gonorrhea: NEGATIVE

## 2022-02-26 LAB — OB RESULTS CONSOLE GBS: GBS: NEGATIVE

## 2022-03-07 ENCOUNTER — Other Ambulatory Visit: Payer: Self-pay | Admitting: *Deleted

## 2022-03-07 DIAGNOSIS — C8108 Nodular lymphocyte predominant Hodgkin lymphoma, lymph nodes of multiple sites: Secondary | ICD-10-CM

## 2022-03-07 DIAGNOSIS — D500 Iron deficiency anemia secondary to blood loss (chronic): Secondary | ICD-10-CM

## 2022-03-08 ENCOUNTER — Inpatient Hospital Stay: Payer: Medicaid Other | Admitting: Hematology

## 2022-03-08 ENCOUNTER — Inpatient Hospital Stay: Payer: Medicaid Other

## 2022-03-14 ENCOUNTER — Encounter (HOSPITAL_COMMUNITY): Payer: Self-pay | Admitting: Obstetrics and Gynecology

## 2022-03-14 ENCOUNTER — Inpatient Hospital Stay (HOSPITAL_COMMUNITY): Payer: Medicaid Other | Admitting: Anesthesiology

## 2022-03-14 ENCOUNTER — Inpatient Hospital Stay (HOSPITAL_COMMUNITY)
Admission: AD | Admit: 2022-03-14 | Discharge: 2022-03-15 | DRG: 807 | Disposition: A | Discharge status: Home / Self Care | Payer: Medicaid Other | Attending: Obstetrics and Gynecology | Admitting: Obstetrics and Gynecology

## 2022-03-14 ENCOUNTER — Other Ambulatory Visit: Payer: Self-pay

## 2022-03-14 DIAGNOSIS — O479 False labor, unspecified: Secondary | ICD-10-CM

## 2022-03-14 DIAGNOSIS — J45909 Unspecified asthma, uncomplicated: Secondary | ICD-10-CM | POA: Diagnosis present

## 2022-03-14 DIAGNOSIS — O4292 Full-term premature rupture of membranes, unspecified as to length of time between rupture and onset of labor: Principal | ICD-10-CM | POA: Diagnosis present

## 2022-03-14 DIAGNOSIS — O9902 Anemia complicating childbirth: Secondary | ICD-10-CM | POA: Diagnosis present

## 2022-03-14 DIAGNOSIS — D509 Iron deficiency anemia, unspecified: Secondary | ICD-10-CM | POA: Diagnosis present

## 2022-03-14 DIAGNOSIS — O9952 Diseases of the respiratory system complicating childbirth: Secondary | ICD-10-CM | POA: Diagnosis present

## 2022-03-14 DIAGNOSIS — Z3A38 38 weeks gestation of pregnancy: Secondary | ICD-10-CM

## 2022-03-14 LAB — URINALYSIS, ROUTINE W REFLEX MICROSCOPIC
Bilirubin Urine: NEGATIVE
Glucose, UA: NEGATIVE mg/dL
Ketones, ur: NEGATIVE mg/dL
Nitrite: NEGATIVE
Protein, ur: 30 mg/dL — AB
Specific Gravity, Urine: 1.023 (ref 1.005–1.030)
WBC, UA: >50 WBC/hpf — ABNORMAL HIGH (ref 0–5)
pH: 5 (ref 5.0–8.0)

## 2022-03-14 LAB — POCT FERN TEST
POCT Fern Test: NEGATIVE
POCT Fern Test: POSITIVE

## 2022-03-14 LAB — RPR: RPR Ser Ql: NONREACTIVE

## 2022-03-14 LAB — CBC
HCT: 38.5 % (ref 36.0–46.0)
Hemoglobin: 11.9 g/dL — ABNORMAL LOW (ref 12.0–15.0)
MCH: 23.5 pg — ABNORMAL LOW (ref 26.0–34.0)
MCHC: 30.9 g/dL (ref 30.0–36.0)
MCV: 75.9 fL — ABNORMAL LOW (ref 80.0–100.0)
Platelets: 294 10*3/uL (ref 150–400)
RBC: 5.07 MIL/uL (ref 3.87–5.11)
WBC: 6.3 10*3/uL (ref 4.0–10.5)
nRBC: 0.3 % — ABNORMAL HIGH (ref 0.0–0.2)

## 2022-03-14 LAB — TYPE AND SCREEN
ABO/RH(D): A POS
Antibody Screen: NEGATIVE

## 2022-03-14 MED ORDER — FENTANYL CITRATE (PF) 100 MCG/2ML IJ SOLN: 50.0000 ug | INTRAMUSCULAR | Status: DC | PRN

## 2022-03-14 MED ORDER — PRENATAL MULTIVITAMIN CH
1.0000 | ORAL_TABLET | Freq: Every day | ORAL | Status: DC
  Administered 2022-03-14 – 2022-03-15 (×2): 1 via ORAL
  Filled 2022-03-14 (×2): qty 1

## 2022-03-14 MED ORDER — PHENYLEPHRINE 80 MCG/ML (10ML) SYRINGE FOR IV PUSH (FOR BLOOD PRESSURE SUPPORT): 80.0000 ug | PREFILLED_SYRINGE | INTRAVENOUS | Status: DC | PRN

## 2022-03-14 MED ORDER — OXYTOCIN BOLUS FROM INFUSION
333.0000 mL | Freq: Once | INTRAVENOUS | Status: AC
  Administered 2022-03-14: 333 mL via INTRAVENOUS

## 2022-03-14 MED ORDER — ONDANSETRON HCL 4 MG/2ML IJ SOLN: 4.0000 mg | Freq: Four times a day (QID) | INTRAMUSCULAR | Status: DC | PRN

## 2022-03-14 MED ORDER — ACETAMINOPHEN 325 MG PO TABS
650.0000 mg | ORAL_TABLET | ORAL | Status: DC | PRN
  Administered 2022-03-14: 650 mg via ORAL
  Filled 2022-03-14: qty 2

## 2022-03-14 MED ORDER — OXYTOCIN-SODIUM CHLORIDE 30-0.9 UT/500ML-% IV SOLN
1.0000 m[IU]/min | INTRAVENOUS | Status: DC
  Administered 2022-03-14: 2 m[IU]/min via INTRAVENOUS

## 2022-03-14 MED ORDER — EPHEDRINE 5 MG/ML INJ: 10.0000 mg | INTRAVENOUS | Status: DC | PRN

## 2022-03-14 MED ORDER — COCONUT OIL OIL: 1.0000 | TOPICAL_OIL | Status: DC | PRN

## 2022-03-14 MED ORDER — LIDOCAINE HCL (PF) 1 % IJ SOLN
INTRAMUSCULAR | Status: DC | PRN
  Administered 2022-03-14: 5 mL via EPIDURAL

## 2022-03-14 MED ORDER — OXYCODONE-ACETAMINOPHEN 5-325 MG PO TABS: 1.0000 | ORAL_TABLET | ORAL | Status: DC | PRN

## 2022-03-14 MED ORDER — ONDANSETRON HCL 4 MG/2ML IJ SOLN: 4.0000 mg | INTRAMUSCULAR | Status: DC | PRN

## 2022-03-14 MED ORDER — ACETAMINOPHEN 325 MG PO TABS: 650.0000 mg | ORAL_TABLET | ORAL | Status: DC | PRN

## 2022-03-14 MED ORDER — ZOLPIDEM TARTRATE 5 MG PO TABS: 5.0000 mg | ORAL_TABLET | Freq: Every evening | ORAL | Status: DC | PRN

## 2022-03-14 MED ORDER — LACTATED RINGERS IV SOLN: INTRAVENOUS | Status: DC

## 2022-03-14 MED ORDER — FLEET ENEMA 7-19 GM/118ML RE ENEM: 1.0000 | ENEMA | RECTAL | Status: DC | PRN

## 2022-03-14 MED ORDER — LACTATED RINGERS IV SOLN: 500.0000 mL | Freq: Once | INTRAVENOUS | Status: DC

## 2022-03-14 MED ORDER — BENZOCAINE-MENTHOL 20-0.5 % EX AERO: 1.0000 | INHALATION_SPRAY | CUTANEOUS | Status: DC | PRN

## 2022-03-14 MED ORDER — IBUPROFEN 600 MG PO TABS
600.0000 mg | ORAL_TABLET | Freq: Four times a day (QID) | ORAL | Status: DC
  Administered 2022-03-14 – 2022-03-15 (×5): 600 mg via ORAL
  Filled 2022-03-14 (×6): qty 1

## 2022-03-14 MED ORDER — DIPHENHYDRAMINE HCL 50 MG/ML IJ SOLN: 12.5000 mg | INTRAMUSCULAR | Status: DC | PRN

## 2022-03-14 MED ORDER — LIDOCAINE HCL (PF) 1 % IJ SOLN: 30.0000 mL | INTRAMUSCULAR | Status: DC | PRN

## 2022-03-14 MED ORDER — DIPHENHYDRAMINE HCL 25 MG PO CAPS: 25.0000 mg | ORAL_CAPSULE | Freq: Four times a day (QID) | ORAL | Status: DC | PRN

## 2022-03-14 MED ORDER — SOD CITRATE-CITRIC ACID 500-334 MG/5ML PO SOLN: 30.0000 mL | ORAL | Status: DC | PRN

## 2022-03-14 MED ORDER — FENTANYL CITRATE (PF) 100 MCG/2ML IJ SOLN
INTRAMUSCULAR | Status: AC
  Administered 2022-03-14: 50 ug
  Filled 2022-03-14: qty 2

## 2022-03-14 MED ORDER — OXYTOCIN-SODIUM CHLORIDE 30-0.9 UT/500ML-% IV SOLN
2.5000 [IU]/h | INTRAVENOUS | Status: DC
  Filled 2022-03-14: qty 500

## 2022-03-14 MED ORDER — FENTANYL-BUPIVACAINE-NACL 0.5-0.125-0.9 MG/250ML-% EP SOLN
EPIDURAL | Status: DC | PRN
  Administered 2022-03-14: 12 mL/h via EPIDURAL

## 2022-03-14 MED ORDER — DIBUCAINE (PERIANAL) 1 % EX OINT: 1.0000 | TOPICAL_OINTMENT | CUTANEOUS | Status: DC | PRN

## 2022-03-14 MED ORDER — FENTANYL-BUPIVACAINE-NACL 0.5-0.125-0.9 MG/250ML-% EP SOLN
12.0000 mL/h | EPIDURAL | Status: DC | PRN
  Filled 2022-03-14: qty 250

## 2022-03-14 MED ORDER — METHYLERGONOVINE MALEATE 0.2 MG/ML IJ SOLN: 0.2000 mg | INTRAMUSCULAR | Status: DC | PRN

## 2022-03-14 MED ORDER — ONDANSETRON HCL 4 MG PO TABS: 4.0000 mg | ORAL_TABLET | ORAL | Status: DC | PRN

## 2022-03-14 MED ORDER — METHYLERGONOVINE MALEATE 0.2 MG PO TABS: 0.2000 mg | ORAL_TABLET | ORAL | Status: DC | PRN

## 2022-03-14 MED ORDER — POLYSACCHARIDE IRON COMPLEX 150 MG PO CAPS
150.0000 mg | ORAL_CAPSULE | Freq: Two times a day (BID) | ORAL | Status: DC
  Administered 2022-03-14 – 2022-03-15 (×3): 150 mg via ORAL
  Filled 2022-03-14 (×3): qty 1

## 2022-03-14 MED ORDER — LACTATED RINGERS IV SOLN: 500.0000 mL | INTRAVENOUS | Status: DC | PRN

## 2022-03-14 MED ORDER — SENNOSIDES-DOCUSATE SODIUM 8.6-50 MG PO TABS
2.0000 | ORAL_TABLET | Freq: Every day | ORAL | Status: DC
  Administered 2022-03-15: 2 via ORAL
  Filled 2022-03-14: qty 2

## 2022-03-14 MED ORDER — EPINEPHRINE 0.3 MG/0.3ML IJ SOAJ: 0.3000 mg | INTRAMUSCULAR | Status: DC | PRN

## 2022-03-14 MED ORDER — WITCH HAZEL-GLYCERIN EX PADS: 1.0000 | MEDICATED_PAD | CUTANEOUS | Status: DC | PRN

## 2022-03-14 MED ORDER — SIMETHICONE 80 MG PO CHEW: 80.0000 mg | CHEWABLE_TABLET | ORAL | Status: DC | PRN

## 2022-03-14 MED ORDER — OXYCODONE-ACETAMINOPHEN 5-325 MG PO TABS: 2.0000 | ORAL_TABLET | ORAL | Status: DC | PRN

## 2022-03-14 NOTE — H&P (Signed)
HPI: 26 y.o. G3P1011 @ 58w1destimated gestational age (as dated by LMP c/w 7 week ultrasound) presents for leakage of fluid. Fern test was positive in MAU. She was found to be 2/60/-1 in MAU.  Upon my arrival to room, patient was sitting up for epidural.  Leakage of fluid:  Yes Vaginal bleeding:  No Contractions:  No Fetal movement:  Yes  Prenatal care has been provided by Dr. TChristophe Louis(OBGYN)  ROS:  Denies fevers, chills, chest pain, visual changes, SOB, RUQ/epigastric pain, N/V, dysuria, hematuria, or sudden onset/worsening bilateral LE or facial edema.  Pregnancy complicated by: Iron deficiency anemia (oral iron) Asthma (mild, intermittent)  Prenatal Transfer Tool  Maternal Diabetes: No Genetic Screening: Declined Maternal Ultrasounds/Referrals: Normal Fetal Ultrasounds or other Referrals:  None Maternal Substance Abuse:  No Significant Maternal Medications:  None Significant Maternal Lab Results: Group B Strep negative   Prenatal Labs Blood type:  A Positive Antibody screen:  Negative CBC:  H/H 10.2/33.4 Rubella: Immune RPR:  Non-reactive Hep B:  Negative Hep C:  Negative HIV:  Negative GC/CT:  Negative Glucola:  87 (wnl)  Immunizations: Tdap: Given prenatally Flu: No  OBHx:  OB History     Gravida  3   Para  1   Term  1   Preterm      AB  1   Living  1      SAB  1   IAB      Ectopic      Multiple  0   Live Births  1          PMHx:  See above Meds:  PNV, oral iron Allergy:   Allergies  Allergen Reactions   Fruit & Vegetable Daily [Nutritional Supplements] Swelling    Pineapple, plum,    Peanut-Containing Drug Products Swelling   Iron Sucrose Hives   SurgHx: Lymph node removal from groin SocHx:   Denies Tobacco, ETOH, illicit drugs  O: BP 1465/03  Pulse 73   Temp 98.5 F (36.9 C) (Oral)   Resp 12   Ht '5\' 1"'$  (1.549 m)   Wt 62.8 kg   BMI 26.15 kg/m  Gen. AAOx3, NAD CV.  Normal rate Resp. Breathing through  contractions Abd. Gravid, soft, non-tender throughout, no rebound/guarding Extr.  No bilateral LE edema, no calf tenderness bilaterally SVE:  deferred, patient desires check after epidural  FHT: 120 baseline, moderate variability, + accels,  - decels Toco: q 1-2 min   Labs: see orders  A/P:  26y.o. [id number]@ 352w1dho presents for induction of labor for PROM.  - Admit to L&D - Admit labs (CBC, T&S, COVID screen per protocol) - CEFM/Toco - FWB:  Category I - Diet:  Clear liquids - IVF:  LR at 125cc/hour - VTE Prophylaxis:  SCDs - GBS Status:  Negative - Presentation:  Cephalic by sutures by cervical exam in MAU - Pain control:  Awaiting epidural - Induction method:  Pitocin 2x2 - Anticipate SVD  MeDrema DallasDO

## 2022-03-14 NOTE — MAU Note (Signed)
Leah Gould is a 26 y.o. at 1w1dhere in MAU reporting: rupture of membranes tonight around 061 Pt states it was clear fluid. +FM. Pt denies VB or discharge. Pt states she had braxton hicks before her water broke but since then she hasn't felt any ctx.   Onset of complaint: 03/14/2022 Pain score: 0 Vitals:   03/14/22 0203  BP: 114/70  Pulse: 81  Resp: 12  Temp: 98.7 F (37.1 C)     FHT:144 Lab orders placed from triage: FMaryann Alar UA

## 2022-03-14 NOTE — Anesthesia Procedure Notes (Signed)
Epidural Patient location during procedure: OB Start time: 03/14/2022 6:41 AM End time: 03/14/2022 6:52 AM  Staffing Anesthesiologist: Barnet Glasgow, MD Performed: anesthesiologist   Preanesthetic Checklist Completed: patient identified, IV checked, site marked, risks and benefits discussed, surgical consent, monitors and equipment checked, pre-op evaluation and timeout performed  Epidural Patient position: sitting Prep: DuraPrep and site prepped and draped Patient monitoring: continuous pulse ox and blood pressure Approach: midline Location: L3-L4 Injection technique: LOR air  Needle:  Needle type: Tuohy  Needle gauge: 17 G Needle length: 9 cm and 9 Needle insertion depth: 5 cm Catheter type: closed end flexible Catheter size: 19 Gauge Catheter at skin depth: 11 cm Test dose: negative  Assessment Events: blood not aspirated, injection not painful, no injection resistance, no paresthesia and negative IV test  Additional Notes Patient identified. Risks/Benefits/Options discussed with patient including but not limited to bleeding, infection, nerve damage, paralysis, failed block, incomplete pain control, headache, blood pressure changes, nausea, vomiting, reactions to medication both or allergic, itching and postpartum back pain. Confirmed with bedside nurse the patient's most recent platelet count. Confirmed with patient that they are not currently taking any anticoagulation, have any bleeding history or any family history of bleeding disorders. Patient expressed understanding and wished to proceed. All questions were answered. Sterile technique was used throughout the entire procedure. Please see nursing notes for vital signs. Test dose was given through epidural needle and negative prior to continuing to dose epidural or start infusion. Warning signs of high block given to the patient including shortness of breath, tingling/numbness in hands, complete motor block, or any  concerning symptoms with instructions to call for help. Patient was given instructions on fall risk and not to get out of bed. All questions and concerns addressed with instructions to call with any issues. 1 Attempt (S) . Patient tolerated procedure well.

## 2022-03-14 NOTE — Anesthesia Preprocedure Evaluation (Signed)
Anesthesia Evaluation  Patient identified by MRN, date of birth, ID band Patient awake    Reviewed: Allergy & Precautions, NPO status , Patient's Chart, lab work & pertinent test results  Airway Mallampati: II  TM Distance: >3 FB Neck ROM: Full    Dental no notable dental hx. (+) Teeth Intact, Dental Advisory Given   Pulmonary asthma ,    Pulmonary exam normal breath sounds clear to auscultation       Cardiovascular Exercise Tolerance: Good negative cardio ROS Normal cardiovascular exam Rhythm:Regular Rate:Normal     Neuro/Psych negative neurological ROS  negative psych ROS   GI/Hepatic negative GI ROS, Neg liver ROS,   Endo/Other  negative endocrine ROS  Renal/GU negative Renal ROS     Musculoskeletal   Abdominal   Peds  Hematology Lab Results      Component                Value               Date                            HGB                      11.9 (L)            03/14/2022                HCT                      38.5                03/14/2022                PLT                      294                 03/14/2022              Anesthesia Other Findings   Reproductive/Obstetrics (+) Pregnancy                             Anesthesia Physical Anesthesia Plan  ASA: 2  Anesthesia Plan: Epidural   Post-op Pain Management:    Induction:   PONV Risk Score and Plan:   Airway Management Planned:   Additional Equipment:   Intra-op Plan:   Post-operative Plan:   Informed Consent: I have reviewed the patients History and Physical, chart, labs and discussed the procedure including the risks, benefits and alternatives for the proposed anesthesia with the patient or authorized representative who has indicated his/her understanding and acceptance.       Plan Discussed with:   Anesthesia Plan Comments: (38.1 wk G3P1 for LEa)        Anesthesia Quick Evaluation

## 2022-03-14 NOTE — Anesthesia Postprocedure Evaluation (Signed)
Anesthesia Post Note  Patient: Leah Gould  Procedure(s) Performed: AN AD HOC LABOR EPIDURAL     Patient location during evaluation: Mother Baby Anesthesia Type: Epidural Level of consciousness: awake and alert and oriented Pain management: satisfactory to patient Vital Signs Assessment: post-procedure vital signs reviewed and stable Respiratory status: respiratory function stable Cardiovascular status: stable Postop Assessment: no headache, no backache, epidural receding, patient able to bend at knees, no signs of nausea or vomiting, adequate PO intake and able to ambulate Anesthetic complications: no   No notable events documented.  Last Vitals:  Vitals:   03/14/22 1025 03/14/22 1130  BP: 119/85 122/74  Pulse: 71 70  Resp: 16 16  Temp: 36.5 C 36.8 C  SpO2: 100% 100%    Last Pain:  Vitals:   03/14/22 1230  TempSrc:   PainSc: 0-No pain   Pain Goal:                   Lamount Bankson

## 2022-03-14 NOTE — MAU Provider Note (Signed)
Chief Complaint:  Rupture of Membranes   Event Date/Time   First Provider Initiated Contact with Patient 03/14/22 0226      HPI: Leah Gould is a 26 y.o. G3P1011 at 26w1dho presents to maternity admissions reporting leaking fluid.   Having some contractions.  RN exam neg for ferning. . She reports good fetal movement, denies vaginal bleeding, vaginal itching/burning, urinary symptoms, h/a, dizziness, n/v, diarrhea, constipation or fever/chills.    Vaginal Discharge The patient's primary symptoms include pelvic pain and vaginal discharge. The patient's pertinent negatives include no genital itching or vaginal bleeding. This is a new problem. The current episode started today. The problem has been unchanged. The pain is mild. Pertinent negatives include no chills, fever, nausea or vomiting. The vaginal discharge was clear and watery. There has been no bleeding. She has not been passing clots. She has not been passing tissue. Nothing aggravates the symptoms. She has tried nothing for the symptoms.   Past Medical History: Past Medical History:  Diagnosis Date   Anemia    Asthma    no meds, allergy induced   [place]    Inguinal adenopathy 12/22/2018    Past obstetric history: OB History  Gravida Para Term Preterm AB Living  '3 1 1   1 1  '$ SAB IAB Ectopic Multiple Live Births  1     0 1    # Outcome Date GA Lbr Len/2nd Weight Sex Delivery Anes PTL Lv  3 Current           2 Term 01/11/20 460w2d7:58 / 00:07 3226 g M Vag-Spont EPI  LIV     Birth Comments: WDL  1 SAB 11/2018            Past Surgical History: Past Surgical History:  Procedure Laterality Date   MASS EXCISION Left 12/22/2018   Procedure: EXCISION DEEP LEFT INGUINAL LYMPH NODE;  Surgeon: InFanny SkatesMD;  [place] Service: [place];  Laterality: Left;    Family History: History reviewed. No pertinent family history.  Social History: Social History   Tobacco Use    Smoking status: Never   Smokeless tobacco: Never  Vaping Use   Vaping Use: Never used  Substance Use Topics   Alcohol use: No   Drug use: No    Allergies:  Allergies  Allergen Reactions   Fruit & Vegetable Daily [Nutritional Supplements] Swelling    Pineapple, plum,    Peanut-Containing Drug Products Swelling   Iron Sucrose Hives    Meds:  Medications Prior to Admission  Medication Sig Dispense Refill Last Dose   albuterol (PROAIR HFA) 108 (90 Base) MCG/ACT inhaler Inhale 2 puffs into the lungs every 4 (four) hours as needed for wheezing or shortness of breath (coughing fits). 18 g 1    cyclobenzaprine (FLEXERIL) 5 MG tablet Take 1-2 tablets (5-10 mg total) by mouth 3 (three) times daily as needed for muscle spasms. 30 tablet 0    EPINEPHrine 0.3 mg/0.3 mL IJ SOAJ injection Inject 0.3 mg into the muscle as needed for anaphylaxis. 1 each 2    iron polysaccharides (NIFEREX) 150 MG capsule Take 1 capsule (150 mg total) by mouth 2 (two) times daily. 60 capsule 5    ondansetron (ZOFRAN) 4 MG tablet Take 1 tablet (4 mg total) by mouth every 6 (six) hours as needed for nausea. (Patient not taking: Reported on 10/31/2021) 20 tablet 0    Prenatal Vit-Fe Fumarate-FA (PRENATAL MULTIVITAMIN) TABS tablet Take  1 tablet by mouth daily at 12 noon.      triamcinolone ointment (KENALOG) 0.1 % Apply 1 application topically 2 (two) times daily as needed (rash flare). Do not use on the face, neck, armpits or groin area. Do not use more than 3 weeks in a row. 30 g 2    ZAFEMY 150-35 MCG/24HR transdermal patch 1 patch once a week.       I have reviewed patient's Past Medical Hx, Surgical Hx, Family Hx, Social Hx, medications and allergies.   ROS:  Review of Systems  Constitutional:  Negative for chills and fever.  Gastrointestinal:  Negative for nausea and vomiting.  Genitourinary:  Positive for pelvic pain and vaginal discharge.   Other systems negative  Physical Exam  Patient Vitals for the  past 24 hrs:  BP Temp Temp src Pulse Resp Height Weight  03/14/22 0203 114/70 98.7 F (37.1 C) Oral 81 12 -- --  03/14/22 0145 -- -- -- -- -- '5\' 1"'$  (1.549 m) 62.8 kg   Constitutional: Well-developed, well-nourished female in no acute distress.  Cardiovascular: normal rate and rhythm Respiratory: normal effort, clear to auscultation bilaterally GI: Abd soft, non-tender, gravid appropriate for gestational age.   No rebound or guarding. MS: Extremities nontender, no edema, normal ROM Neurologic: Alert and oriented x 4.  GU: Neg CVAT.  PELVIC EXAM: Cervix pink, visually closed, without lesion, lekaing clear watery discharge, vaginal walls and external genitalia normal          Ferning positive  FHT:  Baseline 130 , moderate variability, accelerations present, no decelerations Contractions:  Irregular     Labs: Results for orders placed or performed during the hospital encounter of 03/14/22 (from the past 24 hour(s))  Fern Test     Status: None   Collection Time: 03/14/22  2:21 AM  Result Value Ref Range   POCT Fern Test Negative = intact amniotic membranes       Imaging:  No results found.  MAU Course/MDM: I have reviewed the triage vital signs and the nursing notes.   Pertinent labs & imaging results that were available during my care of the patient were reviewed by me and considered in my medical decision making (see chart for details).      I have reviewed her medical records including past results, notes and treatments.   I have ordered labs and reviewed results  Fern positive NST reviewed  Treatments in MAU included EFM, SSE.    Assessment: Single IUP at 62w1dPROM at term Prodromal contractions  Plan: Admit to Labor and Delivery Routine orders  Labor team to follow   MHansel FeinsteinCNM, MSN Certified Nurse-Midwife 03/14/2022 2:26 AM

## 2022-03-14 NOTE — Progress Notes (Signed)
In to assess patient. Patient is very comfortable now with epidural. Per primary RN, patient's cervix is 6-7cm and at 0 station. FHT 135bpm, moderate variability, + accel, - decel. Contractions every 1-2 minutes on toco. Pitocin at 88m/min. Anticipate SVD soon.  MDrema Dallas DO

## 2022-03-15 LAB — CBC
HCT: 34.8 % — ABNORMAL LOW (ref 36.0–46.0)
Hemoglobin: 10.6 g/dL — ABNORMAL LOW (ref 12.0–15.0)
MCH: 23.4 pg — ABNORMAL LOW (ref 26.0–34.0)
MCHC: 30.5 g/dL (ref 30.0–36.0)
MCV: 76.8 fL — ABNORMAL LOW (ref 80.0–100.0)
Platelets: 213 10*3/uL (ref 150–400)
RBC: 4.53 MIL/uL (ref 3.87–5.11)
WBC: 8 10*3/uL (ref 4.0–10.5)
nRBC: 0 % (ref 0.0–0.2)

## 2022-03-15 MED ORDER — IBUPROFEN 600 MG PO TABS: 600.0000 mg | ORAL_TABLET | Freq: Four times a day (QID) | ORAL | 0 refills | Status: DC | PRN

## 2022-03-15 NOTE — Discharge Summary (Signed)
Postpartum Discharge Summary  Date of Service updated 03/15/2022     Patient Name: Leah Gould. Peterkin DOB: Jul 07, 1995 MRN: [id number]  Date of admission: 03/14/2022 Delivery date:03/14/2022  Delivering provider: Christophe Louis  Date of discharge: 03/15/2022  Admitting diagnosis: Normal labor [O80, Z37.9] Intrauterine pregnancy: [redacted]w[redacted]d     Secondary diagnosis:  Principal Problem:   Normal labor  Additional problems: None    Discharge diagnosis: Term Pregnancy Delivered                                              Post partum procedures: None Augmentation: N/A Complications: None  Hospital course: Onset of Labor With Vaginal Delivery      26 y.o. yo [id number] at [redacted]w[redacted]d was admitted in Active Labor on 03/14/2022. Patient had an uncomplicated labor course as follows:  Membrane Rupture Time/Date: 12:30 AM ,03/14/2022   Delivery Method:Vaginal, Spontaneous  Episiotomy: None  Lacerations:  None  Patient had an uncomplicated postpartum course.  She is ambulating, tolerating a regular diet, passing flatus, and urinating well. Patient is discharged home in stable condition on 03/15/22.  Newborn Data: Birth date:03/14/2022  Birth time:8:25 AM  Gender:Female  Living status:Living  Apgars:9 ,9  Weight:2590 g   Magnesium Sulfate received: No BMZ received: No Rhophylac:N/A MMR:N/A T-DaP:Given prenatally Flu: N/A Transfusion:No  Physical exam  Vitals:   03/14/22 1540 03/14/22 2025 03/15/22 0032 03/15/22 0530  BP: 106/62 112/72 97/63 116/73  Pulse: 83 75 71 85  Resp: $Remo'16 16  16  'shuNr$ Temp: 97.9 F (36.6 C) 98.4 F (36.9 C) 98.2 F (36.8 C) 97.8 F (36.6 C)  TempSrc: Oral Oral  Oral  SpO2: 98% 98% 100% 97%  Weight:      Height:       General: alert, cooperative, and no distress Lochia: appropriate Uterine Fundus: firm Incision: N/A DVT Evaluation: No evidence of DVT seen on physical exam. Labs: Lab Results  Component Value Date   WBC 8.0 03/15/2022   HGB 10.6 (L) 03/15/2022   HCT  34.8 (L) 03/15/2022   MCV 76.8 (L) 03/15/2022   PLT 213 03/15/2022      Latest Ref Rng & Units 02/08/2022   12:12 PM  CMP  Glucose 70 - 99 mg/dL 83   BUN 6 - 20 mg/dL 8   Creatinine 0.44 - 1.00 mg/dL 0.57   Sodium 135 - 145 mmol/L 135   Potassium 3.5 - 5.1 mmol/L 3.8   Chloride 98 - 111 mmol/L 105   CO2 22 - 32 mmol/L 25   Calcium 8.9 - 10.3 mg/dL 8.7   Total Protein 6.5 - 8.1 g/dL 6.2   Total Bilirubin 0.3 - 1.2 mg/dL 0.5   Alkaline Phos 38 - 126 U/L 178   AST 15 - 41 U/L 22   ALT 0 - 44 U/L 13    Edinburgh Score:    03/14/2022   10:25 AM  Edinburgh Postnatal Depression Scale Screening Tool  I have been able to laugh and see the funny side of things. 0  I have looked forward with enjoyment to things. 0  I have blamed myself unnecessarily when things went wrong. 1  I have been anxious or worried for no good reason. 1  I have felt scared or panicky for no good reason. 2  Things have been getting on top of me. 1  I  have been so unhappy that I have had difficulty sleeping. 0  I have felt sad or miserable. 0  I have been so unhappy that I have been crying. 0  The thought of harming myself has occurred to me. 0  Edinburgh Postnatal Depression Scale Total 5      After visit meds:  Allergies as of 03/15/2022       Reactions   Fruit & Vegetable Daily [nutritional Supplements] Swelling   Pineapple, plum,    Peanut-containing Drug Products Swelling   Iron Sucrose Hives, Swelling        Medication List     STOP taking these medications    Zafemy 150-35 MCG/24HR transdermal patch Generic drug: norelgestromin-ethinyl estradiol       TAKE these medications    EPINEPHrine 0.3 mg/0.3 mL Soaj injection Commonly known as: EPI-PEN Inject 0.3 mg into the muscle as needed for anaphylaxis.   ibuprofen 600 MG tablet Commonly known as: ADVIL Take 1 tablet (600 mg total) by mouth every 6 (six) hours as needed.   iron polysaccharides 150 MG capsule Commonly known as:  NIFEREX Take 1 capsule (150 mg total) by mouth 2 (two) times daily.   prenatal multivitamin Tabs tablet Take 2 tablets by mouth daily at 12 noon.         Discharge home in stable condition Infant Feeding: Bottle Infant Disposition:home with mother Discharge instruction: per After Visit Summary and Postpartum booklet. Activity: Advance as tolerated. Pelvic rest for 6 weeks.  Diet: routine diet Anticipated Birth Control: Unsure Postpartum Appointment:6 weeks Additional Postpartum F/U:  None Future Appointments: Future Appointments  Date Time Provider [place]  03/19/2022  8:00 AM Menke, [place], PT [facility] None  04/01/2022  9:30 AM [facility] None  04/01/2022 10:00 AM Brunetta Genera, MD [facility] None   Follow up Visit:  Follow-up Information     Christophe Louis, MD. Schedule an appointment as soon as possible for a visit in 6 week(s).   Specialty: Obstetrics and Gynecology Why: please make an appointment with Dr. Landry Mellow in 6 weeks for a postpartum visit Contact information: [address] [place] [address] [phone]                     03/15/2022 Christophe Louis, MD

## 2022-03-19 ENCOUNTER — Ambulatory Visit: Payer: Medicaid Other | Admitting: Rehabilitative and Restorative Service Providers"

## 2022-03-23 ENCOUNTER — Telehealth (HOSPITAL_COMMUNITY): Payer: Self-pay

## 2022-03-23 NOTE — Telephone Encounter (Signed)
Patient reports feeling good. "My pelvic area and my upper thigh groin area is sore." RN explained normal healing process of perineum and time frame. RN also reviewed uterine involution and cramping. RN told patient that is she has concerns or feels her pain is not well controlled to contact her OB-GYN. Patient declines any other questions/concerns about her health and healing.  Patient reports that baby is doing well. Eating, peeing/pooping, and gaining weight well. Baby sleeps in a bassinet. RN reviewed ABC's of safe sleep with patient. Patient declines any questions or concerns about baby.  EPDS score is 4.  Sharyn Lull [facility]  03/23/22,1729

## 2022-04-01 ENCOUNTER — Inpatient Hospital Stay: Payer: Medicaid Other | Admitting: Hematology

## 2022-04-01 ENCOUNTER — Inpatient Hospital Stay: Payer: Medicaid Other | Attending: Hematology and Oncology

## 2022-04-01 ENCOUNTER — Other Ambulatory Visit: Payer: Self-pay

## 2022-04-01 DIAGNOSIS — D500 Iron deficiency anemia secondary to blood loss (chronic): Secondary | ICD-10-CM | POA: Diagnosis not present

## 2022-04-01 DIAGNOSIS — N920 Excessive and frequent menstruation with regular cycle: Secondary | ICD-10-CM | POA: Insufficient documentation

## 2022-04-01 DIAGNOSIS — C8108 Nodular lymphocyte predominant Hodgkin lymphoma, lymph nodes of multiple sites: Secondary | ICD-10-CM

## 2022-04-01 LAB — CMP (CANCER CENTER ONLY)
ALT: 17 U/L (ref 0–44)
AST: 22 U/L (ref 15–41)
Albumin: 4 g/dL (ref 3.5–5.0)
Alkaline Phosphatase: 121 U/L (ref 38–126)
Anion gap: 6 (ref 5–15)
BUN: 10 mg/dL (ref 6–20)
CO2: 29 mmol/L (ref 22–32)
Calcium: 9.3 mg/dL (ref 8.9–10.3)
Chloride: 106 mmol/L (ref 98–111)
Creatinine: 0.77 mg/dL (ref 0.44–1.00)
GFR, Estimated: >60 mL/min (ref >60)
Glucose, Bld: 96 mg/dL (ref 70–99)
Potassium: 3.8 mmol/L (ref 3.5–5.1)
Sodium: 141 mmol/L (ref 135–145)
Total Bilirubin: 0.3 mg/dL (ref 0.3–1.2)
Total Protein: 6.4 g/dL — ABNORMAL LOW (ref 6.5–8.1)

## 2022-04-01 LAB — CBC WITH DIFFERENTIAL (CANCER CENTER ONLY)
Abs Immature Granulocytes: 0.01 10*3/uL (ref 0.00–0.07)
Basophils Absolute: 0 10*3/uL (ref 0.0–0.1)
Basophils Relative: 1 %
Eosinophils Absolute: 0.2 10*3/uL (ref 0.0–0.5)
Eosinophils Relative: 4 %
HCT: 44.4 % (ref 36.0–46.0)
Hemoglobin: 14.1 g/dL (ref 12.0–15.0)
Immature Granulocytes: 0 %
Lymphocytes Relative: 24 %
Lymphs Abs: 1.3 10*3/uL (ref 0.7–4.0)
MCH: 24.9 pg — ABNORMAL LOW (ref 26.0–34.0)
MCHC: 31.8 g/dL (ref 30.0–36.0)
MCV: 78.4 fL — ABNORMAL LOW (ref 80.0–100.0)
Monocytes Absolute: 0.4 10*3/uL (ref 0.1–1.0)
Monocytes Relative: 8 %
Neutro Abs: 3.3 10*3/uL (ref 1.7–7.7)
Neutrophils Relative %: 63 %
Platelet Count: 186 10*3/uL (ref 150–400)
RBC: 5.66 MIL/uL — ABNORMAL HIGH (ref 3.87–5.11)
WBC Count: 5.3 10*3/uL (ref 4.0–10.5)
nRBC: 0 % (ref 0.0–0.2)

## 2022-04-01 LAB — FERRITIN: Ferritin: 54 ng/mL (ref 11–307)

## 2022-04-01 LAB — LACTATE DEHYDROGENASE: LDH: 180 U/L (ref 98–192)

## 2022-04-01 LAB — IRON AND IRON BINDING CAPACITY (CC-WL,HP ONLY)
Iron: 57 ug/dL (ref 28–170)
Saturation Ratios: 16 % (ref 10.4–31.8)
TIBC: 361 ug/dL (ref 250–450)
UIBC: 304 ug/dL (ref 148–442)

## 2022-04-08 ENCOUNTER — Inpatient Hospital Stay (HOSPITAL_BASED_OUTPATIENT_CLINIC_OR_DEPARTMENT_OTHER): Payer: Medicaid Other | Admitting: Hematology

## 2022-04-08 DIAGNOSIS — C8108 Nodular lymphocyte predominant Hodgkin lymphoma, lymph nodes of multiple sites: Secondary | ICD-10-CM | POA: Diagnosis not present

## 2022-04-08 DIAGNOSIS — D500 Iron deficiency anemia secondary to blood loss (chronic): Secondary | ICD-10-CM

## 2022-04-08 NOTE — Progress Notes (Signed)
HEMATOLOGY/ONCOLOGY CLINIC VISIT NOTE  Date of Service: 04/08/22    Patient Care Team: [name], PA as PCP - General (Family Medicine)   CHIEF COMPLAINTS: -nodular Lymphocyte predominant hodgkins lymphoma -Iron deficiency anemia  INTERVAL HISTORY:  [name] is here via phone call for lab discussion as well as continued evaluation and management of her nodular lymphocyte predominant Hodgkin's lymphoma and iron deficiency anemia.  Her last visit with me was in August 2023.  She is feeling better during today's phone call. She had her baby and is doing well after pregnancy.   She denies any new problems.   She is regularly taking po iron supplement.  Labs done 04/01/2022 show resolution of her anemia with her IV iron  MEDICAL HISTORY:  Past Medical History:  Diagnosis Date   Anemia    Asthma    no meds, allergy induced   Hodgkin's lymphoma ([place])    Inguinal adenopathy 12/22/2018    SURGICAL HISTORY: Past Surgical History:  Procedure Laterality Date   MASS EXCISION Left 12/22/2018   Procedure: EXCISION DEEP LEFT INGUINAL LYMPH NODE;  Surgeon: Fanny Skates, MD;  Location: [facility];  Service: General;  Laterality: Left;    SOCIAL HISTORY: Social History   Socioeconomic History   Marital status: Single    Spouse name: Not on file   Number of children: Not on file   Years of education: Not on file   Highest education level: Not on file  Occupational History   Not on file  Tobacco Use   Smoking status: Never   Smokeless tobacco: Never  Vaping Use   Vaping Use: Never used  Substance and Sexual Activity   Alcohol use: No   Drug use: No   Sexual activity: Not Currently    Birth control/protection: None  Other Topics Concern   Not on file  Social History Narrative   Not on file   Social Determinants of Health   Financial Resource Strain: Not on file  Food Insecurity: Not on file  Transportation Needs: Not on file   Physical Activity: Not on file  Stress: Not on file  Social Connections: Not on file  Intimate Partner Violence: Not on file    FAMILY HISTORY: No family history on file.  ALLERGIES:  is allergic to fruit & vegetable daily [nutritional supplements], peanut-containing drug products, and iron sucrose.  MEDICATIONS:  Current Outpatient Medications  Medication Sig Dispense Refill   EPINEPHrine 0.3 mg/0.3 mL IJ SOAJ injection Inject 0.3 mg into the muscle as needed for anaphylaxis. (Patient not taking: Reported on 03/14/2022) 1 each 2   ibuprofen (ADVIL) 600 MG tablet Take 1 tablet (600 mg total) by mouth every 6 (six) hours as needed. 30 tablet 0   iron polysaccharides (NIFEREX) 150 MG capsule Take 1 capsule (150 mg total) by mouth 2 (two) times daily. (Patient not taking: Reported on 03/14/2022) 60 capsule 5   Prenatal Vit-Fe Fumarate-FA (PRENATAL MULTIVITAMIN) TABS tablet Take 2 tablets by mouth daily at 12 noon.     No current facility-administered medications for this visit.    REVIEW OF SYSTEMS:   No fevers/chills/nightsweats. No new lumps/bumps.  PHYSICAL EXAMINATION: Telemedicine visit  LABORATORY DATA:  I have reviewed the data as listed     Latest Ref Rng & Units 04/01/2022    9:33 AM 03/15/2022    5:14 AM 03/14/2022    3:06 AM  CBC  WBC 4.0 - 10.5 K/uL 5.3  8.0  6.3   Hemoglobin 12.0 - 15.0 g/dL 14.1  10.6  11.9   Hematocrit 36.0 - 46.0 % 44.4  34.8  38.5   Platelets 150 - 400 K/uL 186  213  294       Latest Ref Rng & Units 04/01/2022    9:33 AM 02/08/2022   12:12 PM 10/31/2021    1:35 PM  CMP  Glucose 70 - 99 mg/dL 96  83  89   BUN 6 - 20 mg/dL '10  8  6   '$ Creatinine 0.44 - 1.00 mg/dL 0.77  0.57  0.51   Sodium 135 - 145 mmol/L 141  135  136   Potassium 3.5 - 5.1 mmol/L 3.8  3.8  3.7   Chloride 98 - 111 mmol/L 106  105  107   CO2 22 - 32 mmol/L '29  25  24   '$ Calcium 8.9 - 10.3 mg/dL 9.3  8.7  9.0   Total Protein 6.5 - 8.1 g/dL 6.4  6.2  6.1   Total Bilirubin 0.3  - 1.2 mg/dL 0.3  0.5  0.2   Alkaline Phos 38 - 126 U/L 121  178  63   AST 15 - 41 U/L '22  22  21   '$ ALT 0 - 44 U/L '17  13  13    '$ . Lab Results  Component Value Date   IRON 57 04/01/2022   TIBC 361 04/01/2022   IRONPCTSAT 16 04/01/2022   (Iron and TIBC)  Lab Results  Component Value Date   FERRITIN 54 04/01/2022   . Lab Results  Component Value Date   LDH 180 04/01/2022    RADIOGRAPHIC STUDIES: No images were reviewed during this visit.  ASSESSMENT & PLAN:   1. 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 The sample was sent for a second opinion and is awaiting further characterization for confirmation. -11/17/18 CT A/P which revealed "Enlarged presumed lymph node in the left inguinal region measuring 5.2 x 3.9 x 3.8 cm. There is enhancement of this lymph node. Etiology for this localized lymph node prominence is uncertain. Tissue sampling may well be warranted given this finding. No other adenopathy noted in the abdomen or pelvis. 2.  Spleen does not appear appreciably enlarged. 3. Wall thickening in the urinary bladder. Suspect a degree of cystitis. No renal or ureteral calculus. No hydronephrosis. 4. Apparent recent ovarian cyst rupture on the right with mild fluid tracking from the right adnexa and cul-de-sac. 5. No evident bowel obstruction or bowel wall thickening. No abscess in the abdomen pelvis. No periappendiceal region inflammatory change." -01/11/2019 PET scan revealed "Bilateral axillary and right subpectoral adenopathy noted with Deauville 5 disease on the left and Deauville 4 disease on the right in this region. Diffuse uniform endometrial activity with SUV of 13.2. Although this is very high for physiologic endometrial activity, the diffuse  uniform distribution is more characteristic of benign physiologic endometrial activity rather than malignancy. If the patient has abnormal uterine bleeding or if otherwise clinically warranted, pelvic sonography could be utilized for further characterization of the endometrium. Postoperative findings and low-grade metabolic activity in the vicinity of the excision biopsy of the left inguinal lymph node. No other hypermetabolic adenopathy in the abdomen/pelvis identified" -01/01/2019 biopsy sampling ([id number]) from the [facility] shows lymphocyte predominant Hodgkin's  lymphoma. -04/27/2019 PET/CT scan ([id number]) which revealed "1. Significant response to therapy of lymphoma. Residual small bilateral axillary nodes with low-level hypermetabolism. (Deauville) 2. 2. 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 3. Right ovarian hypermetabolism is likely physiologic and may represent underlying corpus luteal cyst." --No lab or clinical evidence of NLPHL recurrence at this time. Will continue watchful observation.   2. Iron deficiency anemia 2/2 to menstrual bleeding and then from pregnancy -Currently on Zafemy transdermal patch with improvement of menstrual bleeding.  -Patient received IV venofer x 1 dose on 12/20/2020 but developed infusion reaction that required IV fluids, benadryl, steroids and ED evaluation  - severe anemia during pregnancy. Tolerated IV injectafer with resolution of anemia. Has had her child PLAN. -She has no clinical signs or symptoms of Hodgkin's lymphoma progression at this time. -She delivered her baby on 03/14/22 and is doing very well after this. -Continue iron polysaccharide '150mg'$ p o daily  supplement for 6 months. -Discussed her lab results with the patient, which showed WBC of 5.3k, hemoglobin of 14.1k, and platelets of 186k. -ferritin 54. -no indication for additional IV iron at this time  Follow-up RTC with Dr Irene Limbo  with labs in 6 months  The total time spent in the appointment was 20 minutes* .  All of the patient's questions were answered with apparent satisfaction. The patient knows to call the clinic with any problems, questions or concerns.  I, Eugene Gavia, am acting as scribe for Dr. Sullivan Lone, MD .I have reviewed the above documentation for accuracy and completeness, and I agree with the above. Brunetta Genera MD   .The total time spent in the appointment was 20 minutes* .  All of the patient's questions were answered with apparent satisfaction. The patient knows to call the clinic with any problems, questions or concerns.   Sullivan Lone MD MS AAHIVMS [facility] Hematology/Oncology Physician [facility]  .*Total Encounter Time as defined by the Centers for Medicare and Medicaid Services includes, in addition to the face-to-face time of a patient visit (documented in the note above) non-face-to-face time: obtaining and reviewing outside history, ordering and reviewing medications, tests or procedures, care coordination (communications with other health care professionals or caregivers) and documentation in the medical record.

## 2022-04-15 ENCOUNTER — Telehealth: Payer: Self-pay | Admitting: Hematology

## 2022-04-15 NOTE — Telephone Encounter (Signed)
Scheduled appointment per 10/15 staff message. Left voicemail.

## 2022-10-11 ENCOUNTER — Other Ambulatory Visit: Payer: Self-pay

## 2022-10-11 DIAGNOSIS — D500 Iron deficiency anemia secondary to blood loss (chronic): Secondary | ICD-10-CM

## 2022-10-14 ENCOUNTER — Inpatient Hospital Stay: Payer: Medicaid Other | Attending: Hematology

## 2022-10-14 ENCOUNTER — Inpatient Hospital Stay (HOSPITAL_BASED_OUTPATIENT_CLINIC_OR_DEPARTMENT_OTHER): Payer: Medicaid Other | Admitting: Hematology

## 2022-10-14 ENCOUNTER — Other Ambulatory Visit: Payer: Self-pay

## 2022-10-14 VITALS — BP 104/77 | HR 84 | Temp 97.9°F | Resp 20 | Wt 114.0 lb

## 2022-10-14 DIAGNOSIS — C8105 Nodular lymphocyte predominant Hodgkin lymphoma, lymph nodes of inguinal region and lower limb: Secondary | ICD-10-CM | POA: Insufficient documentation

## 2022-10-14 DIAGNOSIS — D500 Iron deficiency anemia secondary to blood loss (chronic): Secondary | ICD-10-CM | POA: Diagnosis not present

## 2022-10-14 DIAGNOSIS — D509 Iron deficiency anemia, unspecified: Secondary | ICD-10-CM | POA: Insufficient documentation

## 2022-10-14 DIAGNOSIS — C8108 Nodular lymphocyte predominant Hodgkin lymphoma, lymph nodes of multiple sites: Secondary | ICD-10-CM

## 2022-10-14 LAB — CMP (CANCER CENTER ONLY)
ALT: 12 U/L (ref 0–44)
AST: 20 U/L (ref 15–41)
Albumin: 4.3 g/dL (ref 3.5–5.0)
Alkaline Phosphatase: 67 U/L (ref 38–126)
Anion gap: 6 (ref 5–15)
BUN: 10 mg/dL (ref 6–20)
CO2: 29 mmol/L (ref 22–32)
Calcium: 9.7 mg/dL (ref 8.9–10.3)
Chloride: 104 mmol/L (ref 98–111)
Creatinine: 0.97 mg/dL (ref 0.44–1.00)
GFR, Estimated: >60 mL/min (ref >60)
Glucose, Bld: 90 mg/dL (ref 70–99)
Potassium: 3.5 mmol/L (ref 3.5–5.1)
Sodium: 139 mmol/L (ref 135–145)
Total Bilirubin: 0.6 mg/dL (ref 0.3–1.2)
Total Protein: 7.4 g/dL (ref 6.5–8.1)

## 2022-10-14 LAB — CBC WITH DIFFERENTIAL (CANCER CENTER ONLY)
Abs Immature Granulocytes: 0.01 10*3/uL (ref 0.00–0.07)
Basophils Absolute: 0.1 10*3/uL (ref 0.0–0.1)
Basophils Relative: 2 %
Eosinophils Absolute: 0.6 10*3/uL — ABNORMAL HIGH (ref 0.0–0.5)
Eosinophils Relative: 14 %
HCT: 40.7 % (ref 36.0–46.0)
Hemoglobin: 13.4 g/dL (ref 12.0–15.0)
Immature Granulocytes: 0 %
Lymphocytes Relative: 42 %
Lymphs Abs: 1.9 10*3/uL (ref 0.7–4.0)
MCH: 26.7 pg (ref 26.0–34.0)
MCHC: 32.9 g/dL (ref 30.0–36.0)
MCV: 81.2 fL (ref 80.0–100.0)
Monocytes Absolute: 0.4 10*3/uL (ref 0.1–1.0)
Monocytes Relative: 9 %
Neutro Abs: 1.5 10*3/uL — ABNORMAL LOW (ref 1.7–7.7)
Neutrophils Relative %: 33 %
Platelet Count: 258 10*3/uL (ref 150–400)
RBC: 5.01 MIL/uL (ref 3.87–5.11)
RDW: 12.8 % (ref 11.5–15.5)
WBC Count: 4.6 10*3/uL (ref 4.0–10.5)
nRBC: 0 % (ref 0.0–0.2)

## 2022-10-14 LAB — LACTATE DEHYDROGENASE: LDH: 143 U/L (ref 98–192)

## 2022-10-14 LAB — IRON AND IRON BINDING CAPACITY (CC-WL,HP ONLY)
Iron: 59 ug/dL (ref 28–170)
Saturation Ratios: 12 % (ref 10.4–31.8)
TIBC: 496 ug/dL — ABNORMAL HIGH (ref 250–450)
UIBC: 437 ug/dL (ref 148–442)

## 2022-10-14 LAB — FERRITIN: Ferritin: 5 ng/mL — ABNORMAL LOW (ref 11–307)

## 2022-10-14 NOTE — Progress Notes (Signed)
HEMATOLOGY/ONCOLOGY CLINIC VISIT NOTE  Date of Service: 10/14/22    Patient Care Team: Wilfrid Lund, PA as PCP - General (Family Medicine)   CHIEF COMPLAINTS: -nodular Lymphocyte predominant hodgkins lymphoma -Iron deficiency anemia  INTERVAL HISTORY:  [name] is here via phone call for lab discussion as well as continued evaluation and management of her nodular lymphocyte predominant Hodgkin's lymphoma and iron deficiency anemia.  Patient was last seen by me on 04/08/2022 and she was doing well overall.   Patient reports she has been doing well overall without any new medical concerns since our last visit. She reports of eye swelling and congestion due to pollen allergies. She has not been taking allergy medication, but has been using eye drops.   Patient reports she was taking iron polysaccharide regularly as prescribed without any toxicities. She reports she has been having regular menstrual cycle without any abnormal bleeding.   She denies fever, chills, night sweats, unexpected weight loss, back pain, chest pain, abnormal bowel movement, abnormal bleeding, or leg swelling.   Patient notes she is unable to gain weight due to not being able to eat. She feels hungry, but she gets nauseous when she eats anything.   MEDICAL HISTORY:  Past Medical History:  Diagnosis Date   Anemia    Asthma    no meds, allergy induced   Hodgkin's lymphoma ([facility])    Inguinal adenopathy 12/22/2018    SURGICAL HISTORY: Past Surgical History:  Procedure Laterality Date   MASS EXCISION Left 12/22/2018   Procedure: EXCISION DEEP LEFT INGUINAL LYMPH NODE;  Surgeon: Claud Kelp, MD;  Location: [facility];  Service: General;  Laterality: Left;    SOCIAL HISTORY: Social History   Socioeconomic History   Marital status: Single    Spouse name: Not on file   Number of children: Not on file   Years of education: Not on file   Highest education level: Not on file   Occupational History   Not on file  Tobacco Use   Smoking status: Never   Smokeless tobacco: Never  Vaping Use   Vaping Use: Never used  Substance and Sexual Activity   Alcohol use: No   Drug use: No   Sexual activity: Not Currently    Birth control/protection: None  Other Topics Concern   Not on file  Social History Narrative   Not on file   Social Determinants of Health   Financial Resource Strain: Not on file  Food Insecurity: Not on file  Transportation Needs: Not on file  Physical Activity: Not on file  Stress: Not on file  Social Connections: Not on file  Intimate Partner Violence: Not on file    FAMILY HISTORY: No family history on file.  ALLERGIES:  is allergic to fruit & vegetable daily [nutritional supplements], peanut-containing drug products, and iron sucrose.  MEDICATIONS:  Current Outpatient Medications  Medication Sig Dispense Refill   EPINEPHrine 0.3 mg/0.3 mL IJ SOAJ injection Inject 0.3 mg into the muscle as needed for anaphylaxis. (Patient not taking: Reported on 03/14/2022) 1 each 2   ibuprofen (ADVIL) 600 MG tablet Take 1 tablet (600 mg total) by mouth every 6 (six) hours as needed. 30 tablet 0   iron polysaccharides (NIFEREX) 150 MG capsule Take 1 capsule (150 mg total) by mouth 2 (two) times daily. (Patient not taking: Reported on 03/14/2022) 60 capsule 5   Prenatal Vit-Fe Fumarate-FA (PRENATAL MULTIVITAMIN) TABS tablet Take 2 tablets by mouth daily at 12  noon.     No current facility-administered medications for this visit.    REVIEW OF SYSTEMS:   No fevers/chills/nightsweats. No new lumps/bumps.  PHYSICAL EXAMINATION: .BP 104/77   Pulse 84   Temp 97.9 F (36.6 C)   Resp 20   Wt 114 lb (51.7 kg)   SpO2 99%   BMI 21.54 kg/m  . GENERAL:alert, in no acute distress and comfortable SKIN: no acute rashes, no significant lesions EYES: conjunctiva are pink and non-injected, sclera anicteric OROPHARYNX: MMM, no exudates, no oropharyngeal  erythema or ulceration NECK: supple, no JVD LYMPH:  no palpable lymphadenopathy in the cervical, axillary or inguinal regions LUNGS: clear to auscultation b/l with normal respiratory effort HEART: regular rate & rhythm ABDOMEN:  normoactive bowel sounds , non tender, not distended. Extremity: no pedal edema PSYCH: alert & oriented x 3 with fluent speech NEURO: no focal motor/sensory deficits   LABORATORY DATA:  I have reviewed the data as listed     Latest Ref Rng & Units 10/14/2022    9:38 AM 04/01/2022    9:33 AM 03/15/2022    5:14 AM  CBC  WBC 4.0 - 10.5 K/uL 4.6  5.3  8.0   Hemoglobin 12.0 - 15.0 g/dL 16.1  09.6  04.5   Hematocrit 36.0 - 46.0 % 40.7  44.4  34.8   Platelets 150 - 400 K/uL 258  186  213       Latest Ref Rng & Units 10/14/2022    9:38 AM 04/01/2022    9:33 AM 02/08/2022   12:12 PM  CMP  Glucose 70 - 99 mg/dL 90  96  83   BUN 6 - 20 mg/dL Creatinine 0.44 - 1.00 mg/dL 4.09  8.11  9.14   Sodium 135 - 145 mmol/L 139  141  135   Potassium 3.5 - 5.1 mmol/L 3.5  3.8  3.8   Chloride 98 - 111 mmol/L 104  106  105   CO2 22 - 32 mmol/L Calcium 8.9 - 10.3 mg/dL 9.7  9.3  8.7   Total Protein 6.5 - 8.1 g/dL 7.4  6.4  6.2   Total Bilirubin 0.3 - 1.2 mg/dL 0.6  0.3  0.5   Alkaline Phos 38 - 126 U/L 67  121  178   AST 15 - 41 U/L ALT 0 - 44 U/L . Lab Results  Component Value Date   IRON 59 10/14/2022   TIBC 496 (H) 10/14/2022   IRONPCTSAT 12 10/14/2022   (Iron and TIBC)  Lab Results  Component Value Date   FERRITIN 5 (L) 10/14/2022   . Lab Results  Component Value Date   LDH 143 10/14/2022    RADIOGRAPHIC STUDIES: No images were reviewed during this visit.  ASSESSMENT & PLAN:   1. 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 The sample was sent for a second opinion and is awaiting further characterization for confirmation. -11/17/18 CT A/P which revealed "Enlarged presumed lymph node in the left inguinal region measuring 5.2 x 3.9 x 3.8 cm. There is enhancement of this lymph node. Etiology for this localized lymph node prominence is uncertain. Tissue sampling  may well be warranted given this finding. No other adenopathy noted in the abdomen or pelvis. 2.  Spleen does not appear appreciably enlarged. 3. Wall thickening in the urinary bladder. Suspect a degree of cystitis. No renal or ureteral calculus. No hydronephrosis. 4. Apparent recent ovarian cyst rupture on the right with mild fluid tracking from the right adnexa and cul-de-sac. 5. No evident bowel obstruction or bowel wall thickening. No abscess in the abdomen pelvis. No periappendiceal region inflammatory change." -01/11/2019 PET scan revealed "Bilateral axillary and right subpectoral adenopathy noted with Deauville 5 disease on the left and Deauville 4 disease on the right in this region. Diffuse uniform endometrial activity with SUV of 13.2. Although this is very high for physiologic endometrial activity, the diffuse uniform distribution is more characteristic of benign physiologic endometrial activity rather than malignancy. If the patient has abnormal uterine bleeding or if otherwise clinically warranted, pelvic sonography could be utilized for further characterization of the endometrium. Postoperative findings and low-grade metabolic activity in the vicinity of the excision biopsy of the left inguinal lymph node. No other hypermetabolic adenopathy in the abdomen/pelvis identified" -01/01/2019 biopsy sampling ([id number]) from the [facility] shows lymphocyte predominant Hodgkin's lymphoma. -04/27/2019 PET/CT scan ([id number]) which revealed "1. Significant response to therapy of lymphoma. Residual small  bilateral axillary nodes with low-level hypermetabolism. (Deauville) 2. 2. 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 3. Right ovarian hypermetabolism is likely physiologic and may represent underlying corpus luteal cyst." --No lab or clinical evidence of NLPHL recurrence at this time. Will continue watchful observation.   2. Iron deficiency anemia 2/2 to menstrual bleeding and then from pregnancy -Currently on Zafemy transdermal patch with improvement of menstrual bleeding.  -Patient received IV venofer x 1 dose on 12/20/2020 but developed infusion reaction that required IV fluids, benadryl, steroids and ED evaluation  - severe anemia during pregnancy. Tolerated IV injectafer with resolution of anemia. Has had her child PLAN. -She has no clinical signs or symptoms of Hodgkin's lymphoma progression at this time. -Discussed lab results from today, 10/14/2022, with the patient. CBC is stable. CMP is stable. -ferritin is down to 5 with iron saturation of 12% -ferritin levels have dropped and since she is not particularly anemia at this time as long as she is able to take PO iron polysaccharide twice daily. If worsening fatigue or heavy periods will need to consider IV injectafer again. -no indication for additional IV iron at this time.  -Recommended to follow-up with PCP if nausea is chronic when eating.  -Recommended to start allergy medication for seasonal allergies.  FOLLOW-UP: RTC with Dr Candise Che with labs in 6 months  The total time spent in the appointment was 20 minutes* .  All of the patient's questions were answered with apparent satisfaction. The patient knows to call the clinic with any problems, questions or concerns.   Wyvonnia Lora MD MS AAHIVMS [facility] Hematology/Oncology Physician [facility]  .*Total Encounter Time as defined by the Centers for Medicare and Medicaid Services includes, in addition to the face-to-face time  of a patient visit (documented in the note above) non-face-to-face time: obtaining and reviewing outside history, ordering and reviewing medications, tests or procedures, care coordination (communications with other health care professionals or caregivers) and documentation in the medical record.   I, Ok Edwards, am acting as a Neurosurgeon for Wyvonnia Lora, MD. .I have reviewed the above documentation for accuracy and completeness, and I agree with the above. Corene Cornea  Icis Budreau MD  

## 2022-10-15 ENCOUNTER — Telehealth: Payer: Self-pay | Admitting: Hematology

## 2022-10-20 MED ORDER — POLYSACCHARIDE IRON COMPLEX 150 MG PO CAPS: 150.0000 mg | ORAL_CAPSULE | Freq: Two times a day (BID) | ORAL | 5 refills | Status: DC

## 2022-10-21 ENCOUNTER — Other Ambulatory Visit: Payer: Self-pay

## 2022-10-21 ENCOUNTER — Telehealth: Payer: Self-pay

## 2022-10-21 DIAGNOSIS — D500 Iron deficiency anemia secondary to blood loss (chronic): Secondary | ICD-10-CM

## 2022-10-21 MED ORDER — POLYSACCHARIDE IRON COMPLEX 150 MG PO CAPS: 150.0000 mg | ORAL_CAPSULE | Freq: Two times a day (BID) | ORAL | 5 refills | Status: DC

## 2022-10-21 NOTE — Telephone Encounter (Signed)
Contacted pt per Dr Candise Che to : let pt know her Iron /ferritin levels have dropped and since she is not particularly anemic at this time as long as she is able to take PO iron polysaccharide twice daily, that will be all she needs to do. . If worsening fatigue or heavy periods will need to consider IV injection again.  Pt acknowledged information and agreed to let us know if anything changes with her fatigue level or periods worsen.

## 2022-11-04 ENCOUNTER — Ambulatory Visit (INDEPENDENT_AMBULATORY_CARE_PROVIDER_SITE_OTHER): Payer: Medicaid Other

## 2022-11-04 ENCOUNTER — Ambulatory Visit
Admission: EM | Admit: 2022-11-04 | Discharge: 2022-11-04 | Disposition: A | Discharge status: Home / Self Care | Payer: Medicaid Other | Attending: Urgent Care | Admitting: Urgent Care

## 2022-11-04 ENCOUNTER — Ambulatory Visit: Payer: Self-pay

## 2022-11-04 DIAGNOSIS — M79645 Pain in left finger(s): Secondary | ICD-10-CM | POA: Diagnosis not present

## 2022-11-04 MED ORDER — IBUPROFEN 600 MG PO TABS: 600.0000 mg | ORAL_TABLET | Freq: Four times a day (QID) | ORAL | 0 refills | Status: DC | PRN

## 2022-11-04 NOTE — Discharge Instructions (Addendum)
Use buddy tape system with the index and middle finger to help with your finger pain. Use ibuprofen for pain and inflammation. Follow up with [facility].

## 2022-11-04 NOTE — ED Provider Notes (Signed)
[facility]  Note:  This document was prepared using Conservation officer, historic buildings and may include unintentional dictation errors.  MRN: [id number] DOB: 02/13/1996  Subjective:   Leonor Liv Gethers is a 27 y.o. female presenting for 3-day history of acute onset persistent moderate to severe left index finger pain.  Is having significant difficulty straightening it.  No known trauma.  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  No history of musculoskeletal disorders.  No current facility-administered medications for this encounter.  Current Outpatient Medications:    EPINEPHrine 0.3 mg/0.3 mL IJ SOAJ injection, Inject 0.3 mg into the muscle as needed for anaphylaxis. (Patient not taking: Reported on 03/14/2022), Disp: 1 each, Rfl: 2   ibuprofen (ADVIL) 600 MG tablet, Take 1 tablet (600 mg total) by mouth every 6 (six) hours as needed., Disp: 30 tablet, Rfl: 0   iron polysaccharides (NIFEREX) 150 MG capsule, Take 1 capsule (150 mg total) by mouth 2 (two) times daily., Disp: 60 capsule, Rfl: 5   Prenatal Vit-Fe Fumarate-FA (PRENATAL MULTIVITAMIN) TABS tablet, Take 2 tablets by mouth daily at 12 noon., Disp: , Rfl:    Allergies  Allergen Reactions   Fruit & Vegetable Daily [Nutritional Supplements] Swelling    Pineapple, plum,    Peanut-Containing Drug Products Swelling   Iron Sucrose Hives and Swelling    Past Medical History:  Diagnosis Date   Anemia    Asthma    no meds, allergy induced   Hodgkin's lymphoma ([facility])    Inguinal adenopathy 12/22/2018     Past Surgical History:  Procedure Laterality Date   MASS EXCISION Left 12/22/2018   Procedure: EXCISION DEEP LEFT INGUINAL LYMPH NODE;  Surgeon: Claud Kelp, MD;  Location: [facility];  Service: General;  Laterality: Left;    No family history on file.  Social History   Tobacco Use   Smoking status: Never    Smokeless tobacco: Never  Vaping Use   Vaping Use: Never used  Substance Use Topics   Alcohol use: No   Drug use: No    ROS   Objective:   Vitals: BP 120/81 (BP Location: Right Arm)   Pulse 88   Temp 98.3 F (36.8 C) (Oral)   Resp 16   SpO2 98%   Physical Exam Constitutional:      General: She is not in acute distress.    Appearance: Normal appearance. She is well-developed. She is not ill-appearing, toxic-appearing or diaphoretic.  HENT:     Head: Normocephalic and atraumatic.     Nose: Nose normal.     Mouth/Throat:     Mouth: Mucous membranes are moist.  Eyes:     General: No scleral icterus.       Right eye: No discharge.        Left eye: No discharge.     Extraocular Movements: Extraocular movements intact.  Cardiovascular:     Rate and Rhythm: Normal rate.  Pulmonary:     Effort: Pulmonary effort is normal.  Musculoskeletal:       Hands:     Comments: Patient endorses pain at the left MCP abduction of the left thumb.  General reported pain is out of proportion to physical exam findings.  Skin:    General: Skin is warm and dry.  Neurological:     General: No focal deficit present.     Mental Status:  She is alert and oriented to person, place, and time.  Psychiatric:        Mood and Affect: Mood normal.        Behavior: Behavior normal.     DG Finger Index Left  Result Date: 11/04/2022 CLINICAL DATA:  left finger pain EXAM: LEFT INDEX FINGER 2+V COMPARISON:  None Available. FINDINGS: There is no evidence of fracture or dislocation. There is no evidence of arthropathy or other focal bone abnormality. Soft tissues are unremarkable. IMPRESSION: Negative. Electronically Signed   By: Judie Petit.  Shick M.D.   On: 11/04/2022 08:43     Assessment and Plan :   PDMP not reviewed this encounter.  1. Finger pain, left    Discussed possibility of a trigger finger versus finger strain.  Recommended follow-up with an orthopedist.  In the meantime, recommended patient use  the buddy tape system, ibuprofen for pain and inflammation.  Counseled patient on potential for adverse effects with medications prescribed/recommended today, ER and return-to-clinic precautions discussed, patient verbalized understanding.    Wallis Bamberg, PA-C 11/04/22 0900

## 2022-11-04 NOTE — ED Triage Notes (Signed)
Pt c/o soreness to left index finger started 5/3 and became worse over the weekend-states she is now unable to straighten finger w/o pain-denies known injury-NAD-steady gait

## 2023-04-14 ENCOUNTER — Other Ambulatory Visit: Payer: Self-pay

## 2023-04-14 DIAGNOSIS — D500 Iron deficiency anemia secondary to blood loss (chronic): Secondary | ICD-10-CM

## 2023-04-15 ENCOUNTER — Other Ambulatory Visit: Payer: Medicaid Other

## 2023-04-15 ENCOUNTER — Ambulatory Visit: Payer: Medicaid Other | Admitting: Hematology

## 2023-04-15 ENCOUNTER — Inpatient Hospital Stay (HOSPITAL_BASED_OUTPATIENT_CLINIC_OR_DEPARTMENT_OTHER): Payer: Medicaid Other | Admitting: Hematology

## 2023-04-15 ENCOUNTER — Inpatient Hospital Stay: Payer: Medicaid Other | Attending: Hematology

## 2023-04-15 VITALS — BP 102/65 | HR 76 | Temp 98.3°F | Resp 20 | Wt 103.6 lb

## 2023-04-15 DIAGNOSIS — D509 Iron deficiency anemia, unspecified: Secondary | ICD-10-CM | POA: Insufficient documentation

## 2023-04-15 DIAGNOSIS — D500 Iron deficiency anemia secondary to blood loss (chronic): Secondary | ICD-10-CM

## 2023-04-15 DIAGNOSIS — C8108 Nodular lymphocyte predominant Hodgkin lymphoma, lymph nodes of multiple sites: Secondary | ICD-10-CM | POA: Diagnosis not present

## 2023-04-15 DIAGNOSIS — L819 Disorder of pigmentation, unspecified: Secondary | ICD-10-CM | POA: Diagnosis not present

## 2023-04-15 LAB — CMP (CANCER CENTER ONLY)
ALT: 11 U/L (ref 0–44)
AST: 19 U/L (ref 15–41)
Albumin: 4.4 g/dL (ref 3.5–5.0)
Alkaline Phosphatase: 58 U/L (ref 38–126)
Anion gap: 5 (ref 5–15)
BUN: 9 mg/dL (ref 6–20)
CO2: 29 mmol/L (ref 22–32)
Calcium: 9.7 mg/dL (ref 8.9–10.3)
Chloride: 106 mmol/L (ref 98–111)
Creatinine: 0.96 mg/dL (ref 0.44–1.00)
GFR, Estimated: >60 mL/min (ref >60)
Glucose, Bld: 74 mg/dL (ref 70–99)
Potassium: 3.7 mmol/L (ref 3.5–5.1)
Sodium: 140 mmol/L (ref 135–145)
Total Bilirubin: 0.7 mg/dL (ref 0.3–1.2)
Total Protein: 7.6 g/dL (ref 6.5–8.1)

## 2023-04-15 LAB — CBC WITH DIFFERENTIAL (CANCER CENTER ONLY)
Abs Immature Granulocytes: 0.01 10*3/uL (ref 0.00–0.07)
Basophils Absolute: 0.1 10*3/uL (ref 0.0–0.1)
Basophils Relative: 1 %
Eosinophils Absolute: 0.1 10*3/uL (ref 0.0–0.5)
Eosinophils Relative: 2 %
HCT: 39.4 % (ref 36.0–46.0)
Hemoglobin: 12.7 g/dL (ref 12.0–15.0)
Immature Granulocytes: 0 %
Lymphocytes Relative: 34 %
Lymphs Abs: 1.4 10*3/uL (ref 0.7–4.0)
MCH: 25 pg — ABNORMAL LOW (ref 26.0–34.0)
MCHC: 32.2 g/dL (ref 30.0–36.0)
MCV: 77.6 fL — ABNORMAL LOW (ref 80.0–100.0)
Monocytes Absolute: 0.3 10*3/uL (ref 0.1–1.0)
Monocytes Relative: 7 %
Neutro Abs: 2.4 10*3/uL (ref 1.7–7.7)
Neutrophils Relative %: 56 %
Platelet Count: 275 10*3/uL (ref 150–400)
RBC: 5.08 MIL/uL (ref 3.87–5.11)
RDW: 14.9 % (ref 11.5–15.5)
WBC Count: 4.3 10*3/uL (ref 4.0–10.5)
nRBC: 0 % (ref 0.0–0.2)

## 2023-04-15 LAB — FERRITIN: Ferritin: 3 ng/mL — ABNORMAL LOW (ref 11–307)

## 2023-04-15 LAB — IRON AND IRON BINDING CAPACITY (CC-WL,HP ONLY)
Iron: 56 ug/dL (ref 28–170)
Saturation Ratios: 9 % — ABNORMAL LOW (ref 10.4–31.8)
TIBC: 608 ug/dL — ABNORMAL HIGH (ref 250–450)
UIBC: 552 ug/dL — ABNORMAL HIGH (ref 148–442)

## 2023-04-15 LAB — LACTATE DEHYDROGENASE: LDH: 140 U/L (ref 98–192)

## 2023-04-15 NOTE — Progress Notes (Signed)
HEMATOLOGY/ONCOLOGY CLINIC VISIT NOTE  Date of Service: 04/15/23    Patient Care Team: Wilfrid Lund, PA as PCP - General (Family Medicine)   CHIEF COMPLAINTS: -nodular Lymphocyte predominant hodgkins lymphoma -Iron deficiency anemia  INTERVAL HISTORY:  [name] is here via phone call for lab discussion as well as continued evaluation and management of her nodular lymphocyte predominant Hodgkin's lymphoma and iron deficiency anemia.  Patient was last seen by me on 10/14/2022 and she complained of eye swelling and congestion due to pollen allergies. She also complained of nausea.   Patient notes she has been doing well overall since our last visit. Patient notes she has a mole near her left leg and was referred to a Dermatologist. However, the Dermatologist did not accept her insurance. She was never contacted by her PCP regarding referral to anther Dermatologist.   She first noticed the mole around the end of her last pregnancy and notes the mole started changing in texture and size in summer.   She denies any new infection issues, new lumps/bumps, fever, chills, night sweats, unexpected weight loss, abdominal pain, chest pain, back pain, abnormal bowel movement, black stool, blood in stool, or leg swelling. She denies pagophagia.   She regularly takes her iron supplement. She has been tolerating her iron supplement well without any new or severe toxicities.   Patient notes that she has been having normal menstrual cycle while on estrogen patch.    MEDICAL HISTORY:  Past Medical History:  Diagnosis Date   Anemia    Asthma    no meds, allergy induced   Hodgkin's lymphoma (HCC)    Inguinal adenopathy 12/22/2018    SURGICAL HISTORY: Past Surgical History:  Procedure Laterality Date   MASS EXCISION Left 12/22/2018   Procedure: EXCISION DEEP LEFT INGUINAL LYMPH NODE;  Surgeon: Claud Kelp, MD;  Location: [facility];  Service: General;   Laterality: Left;    SOCIAL HISTORY: Social History   Socioeconomic History   Marital status: Single    Spouse name: Not on file   Number of children: Not on file   Years of education: Not on file   Highest education level: Not on file  Occupational History   Not on file  Tobacco Use   Smoking status: Never   Smokeless tobacco: Never  Vaping Use   Vaping status: Never Used  Substance and Sexual Activity   Alcohol use: No   Drug use: No   Sexual activity: Not on file  Other Topics Concern   Not on file  Social History Narrative   Not on file   Social Determinants of Health   Financial Resource Strain: Not on file  Food Insecurity: Not on file  Transportation Needs: Not on file  Physical Activity: Not on file  Stress: Not on file  Social Connections: Not on file  Intimate Partner Violence: Not on file    FAMILY HISTORY: No family history on file.  ALLERGIES:  is allergic to fruit & vegetable daily [nutritional supplements], peanut-containing drug products, and iron sucrose.  MEDICATIONS:  Current Outpatient Medications  Medication Sig Dispense Refill   EPINEPHrine 0.3 mg/0.3 mL IJ SOAJ injection Inject 0.3 mg into the muscle as needed for anaphylaxis. (Patient not taking: Reported on 03/14/2022) 1 each 2   ibuprofen (ADVIL) 600 MG tablet Take 1 tablet (600 mg total) by mouth every 6 (six) hours as needed. 30 tablet 0   iron polysaccharides (NIFEREX) 150 MG capsule Take 1  capsule (150 mg total) by mouth 2 (two) times daily. 60 capsule 5   Prenatal Vit-Fe Fumarate-FA (PRENATAL MULTIVITAMIN) TABS tablet Take 2 tablets by mouth daily at 12 noon.     No current facility-administered medications for this visit.    REVIEW OF SYSTEMS:   No fevers/chills/nightsweats. No new lumps/bumps.  PHYSICAL EXAMINATION: .BP 102/65   Pulse 76   Temp 98.3 F (36.8 C)   Resp 20   Wt 103 lb 9.6 oz (47 kg)   SpO2 100%   BMI 19.58 kg/m  . GENERAL:alert, in no acute distress  and comfortable SKIN: no acute rashes, no significant lesions EYES: conjunctiva are pink and non-injected, sclera anicteric OROPHARYNX: MMM, no exudates, no oropharyngeal erythema or ulceration NECK: supple, no JVD LYMPH:  no palpable lymphadenopathy in the cervical, axillary or inguinal regions LUNGS: clear to auscultation b/l with normal respiratory effort HEART: regular rate & rhythm ABDOMEN:  normoactive bowel sounds , non tender, not distended. Extremity: no pedal edema PSYCH: alert & oriented x 3 with fluent speech NEURO: no focal motor/sensory deficits   LABORATORY DATA:  I have reviewed the data as listed     Latest Ref Rng & Units 10/14/2022    9:38 AM 04/01/2022    9:33 AM 03/15/2022    5:14 AM  CBC  WBC 4.0 - 10.5 K/uL 4.6  5.3  8.0   Hemoglobin 12.0 - 15.0 g/dL 40.9  81.1  91.4   Hematocrit 36.0 - 46.0 % 40.7  44.4  34.8   Platelets 150 - 400 K/uL 258  186  213       Latest Ref Rng & Units 10/14/2022    9:38 AM 04/01/2022    9:33 AM 02/08/2022   12:12 PM  CMP  Glucose 70 - 99 mg/dL 90  96  83   BUN 6 - 20 mg/dL 10  10  8    Creatinine 0.44 - 1.00 mg/dL 7.82  9.56  2.13   Sodium 135 - 145 mmol/L 139  141  135   Potassium 3.5 - 5.1 mmol/L 3.5  3.8  3.8   Chloride 98 - 111 mmol/L 104  106  105   CO2 22 - 32 mmol/L 29  29  25    Calcium 8.9 - 10.3 mg/dL 9.7  9.3  8.7   Total Protein 6.5 - 8.1 g/dL 7.4  6.4  6.2   Total Bilirubin 0.3 - 1.2 mg/dL 0.6  0.3  0.5   Alkaline Phos 38 - 126 U/L 67  121  178   AST 15 - 41 U/L 20  22  22    ALT 0 - 44 U/L 12  17  13     . Lab Results  Component Value Date   IRON 59 10/14/2022   TIBC 496 (H) 10/14/2022   IRONPCTSAT 12 10/14/2022   (Iron and TIBC)  Lab Results  Component Value Date   FERRITIN 5 (L) 10/14/2022   . Lab Results  Component Value Date   LDH 143 10/14/2022    RADIOGRAPHIC STUDIES: No images were reviewed during this visit.  ASSESSMENT & PLAN:   1. 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 The sample was sent for a second opinion and is awaiting further characterization for confirmation. -11/17/18 CT A/P which revealed "Enlarged presumed  lymph node in the left inguinal region measuring 5.2 x 3.9 x 3.8 cm. There is enhancement of this lymph node. Etiology for this localized lymph node prominence is uncertain. Tissue sampling may well be warranted given this finding. No other adenopathy noted in the abdomen or pelvis. 2.  Spleen does not appear appreciably enlarged. 3. Wall thickening in the urinary bladder. Suspect a degree of cystitis. No renal or ureteral calculus. No hydronephrosis. 4. Apparent recent ovarian cyst rupture on the right with mild fluid tracking from the right adnexa and cul-de-sac. 5. No evident bowel obstruction or bowel wall thickening. No abscess in the abdomen pelvis. No periappendiceal region inflammatory change." -01/11/2019 PET scan revealed "Bilateral axillary and right subpectoral adenopathy noted with Deauville 5 disease on the left and Deauville 4 disease on the right in this region. Diffuse uniform endometrial activity with SUV of 13.2. Although this is very high for physiologic endometrial activity, the diffuse uniform distribution is more characteristic of benign physiologic endometrial activity rather than malignancy. If the patient has abnormal uterine bleeding or if otherwise clinically warranted, pelvic sonography could be utilized for further characterization of the endometrium. Postoperative findings and low-grade metabolic activity in the vicinity of the excision biopsy of the left inguinal lymph node. No other hypermetabolic adenopathy in the abdomen/pelvis identified" -01/01/2019 biopsy sampling ([id number]) from the [facility]  [place] shows lymphocyte predominant Hodgkin's lymphoma. -04/27/2019 PET/CT scan ([id number]) which revealed "1. Significant response to therapy of lymphoma. Residual small bilateral axillary nodes with low-level hypermetabolism. (Deauville) 2. 2. 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 3. Right ovarian hypermetabolism is likely physiologic and may represent underlying corpus luteal cyst." --No lab or clinical evidence of NLPHL recurrence at this time. Will continue watchful observation.   2. Iron deficiency anemia 2/2 to menstrual bleeding and then from pregnancy -Currently on Zafemy transdermal patch with improvement of menstrual bleeding.  -Patient received IV venofer x 1 dose on 12/20/2020 but developed infusion reaction that required IV fluids, benadryl, steroids and ED evaluation  - severe anemia during pregnancy. Tolerated IV injectafer with resolution of anemia. Has had her child  PLAN: -She has no clinical signs or symptoms of Hodgkin's lymphoma progression at this time. -no indication for additional IV iron at this time.  -Discussed lab results from today, 04/15/2023, in detail with the patient. CBC stable, hgb has dropped to 12.7 g/dL from 82.9 g/dL. CMP pending. Iron labs pending.  -Discussed the option of IV Iron infusion. Patient agrees to IV Iron if needed, but prefers Iron supplement.  -Patient has been tolerating her iron supplement well without any new or severe toxicities.   -Continue iron supplement.   3. Hyperpigmented lesion on the leg -Will refer the patient to Dermatologist regarding mole.   FOLLOW-UP: RTC with Dr Candise Che with labs in 6 months Referral to dermatology @ [facility]  The total time spent in the appointment was 30 minutes* .  All of the patient's questions were answered with apparent satisfaction. The patient knows to call the clinic with any problems, questions or concerns.   Wyvonnia Lora MD MS  AAHIVMS [facility] Hematology/Oncology Physician [facility]  .*Total Encounter Time as defined by the Centers for Medicare and Medicaid Services includes, in addition to the face-to-face time of a patient visit (documented in the note above) non-face-to-face time: obtaining and reviewing outside history, ordering and reviewing medications, tests or procedures, care coordination (communications with other health care professionals or caregivers) and  documentation in the medical record.   I,Param Shah,acting as a Neurosurgeon for Wyvonnia Lora, MD.,have documented all relevant documentation on the behalf of Wyvonnia Lora, MD,as directed by  Wyvonnia Lora, MD while in the presence of Wyvonnia Lora, MD.   .I have reviewed the above documentation for accuracy and completeness, and I agree with the above. Johney Maine MD

## 2023-04-18 ENCOUNTER — Telehealth: Payer: Self-pay | Admitting: Hematology

## 2023-04-18 NOTE — Telephone Encounter (Signed)
Per Candise Che 10/15 los patient is aware of scheduled appointment times/dates for follow up

## 2023-05-21 NOTE — Telephone Encounter (Signed)
Telephone call  

## 2023-10-09 ENCOUNTER — Other Ambulatory Visit: Payer: Self-pay

## 2023-10-09 DIAGNOSIS — C8108 Nodular lymphocyte predominant Hodgkin lymphoma, lymph nodes of multiple sites: Secondary | ICD-10-CM

## 2023-10-09 NOTE — Progress Notes (Signed)
 HEMATOLOGY/ONCOLOGY CLINIC VISIT NOTE  Date of Service: 10/10/2023   Patient Care Team: Sinda Duel, PA as PCP - General (Family Medicine)   CHIEF COMPLAINTS: -nodular Lymphocyte predominant hodgkins lymphoma -Iron deficiency anemia  INTERVAL HISTORY:  [name] is a 28 y.o. female here for continued evaluation and management of her nodular lymphocyte predominant Hodgkin's lymphoma and iron deficiency anemia.  I last connected with patient via telemedicine visit on 04/15/2023 and reported having a mole near her left leg which had changed in texture and size during the summer.   Today, she reports that she has been doing well over the last 6 months. Patient reports that her spot in the leg mildly increased in size bus denies any drastic changes. Patient reports that the thickening of the spot fluctuates. Her leg spot is not bothersome.   She has an upcoming dermatology appointment on 10/28/2023 with Louana Roup, DO.   Patient reports mild fatigue. She reports having frequent ice cravings. Patient is not taking oral iron currently but plans to order some more soon. She reports increased cravings for sweets recently.   She reports endorsing seasonal allergies.   Patient denies any other lump/bumps, fever, chills, night sweats, change in breathing, abdominal pain, unexpected weight-loss, or sudden weight loss.   She generally continues to use estrogen patch 3 weeks on 1 week off. Patient reports that she was not on estrogen patch for the last month due to running out and noticed that she did not have any headaches when she was not on her estrogen patch. Her periods have generally been fairly stable while on estrogen patch. She notes that her menstrual bleeding is very heavy when she is not on estrogen patch.   She complains of frequent migraines, which she noticies with sleep deprivation. Patient complains of a sharp localized pain on the top of her head. Her head pain  episodes can occurs 3-4 times a day or not at all. She reports that she is able to feel the pain on the inside of her head. Patient denies any eye changes. She does not follow with neurologisit. She reports that she does not have any headaches when she is not on estrogen patch.   She reports that her children are currently 14 months and 88 years old.   MEDICAL HISTORY:  Past Medical History:  Diagnosis Date   Anemia    Asthma    no meds, allergy induced   Hodgkin's lymphoma (HCC)    Inguinal adenopathy 12/22/2018    SURGICAL HISTORY: Past Surgical History:  Procedure Laterality Date   MASS EXCISION Left 12/22/2018   Procedure: EXCISION DEEP LEFT INGUINAL LYMPH NODE;  Surgeon: Boyce Byes, MD;  Location: [facility];  Service: General;  Laterality: Left;    SOCIAL HISTORY: Social History   Socioeconomic History   Marital status: Single    Spouse name: Not on file   Number of children: Not on file   Years of education: Not on file   Highest education level: Not on file  Occupational History   Not on file  Tobacco Use   Smoking status: Never   Smokeless tobacco: Never  Vaping Use   Vaping status: Never Used  Substance and Sexual Activity   Alcohol use: No   Drug use: No   Sexual activity: Not on file  Other Topics Concern   Not on file  Social History Narrative   Not on file   Social Drivers of Health  Financial Resource Strain: Not on file  Food Insecurity: Not on file  Transportation Needs: Not on file  Physical Activity: Not on file  Stress: Not on file  Social Connections: Not on file  Intimate Partner Violence: Not on file    FAMILY HISTORY: No family history on file.  ALLERGIES:  is allergic to fruit & vegetable daily [nutritional supplements], peanut-containing drug products, and iron sucrose.  MEDICATIONS:  Current Outpatient Medications  Medication Sig Dispense Refill   EPINEPHrine 0.3 mg/0.3 mL IJ SOAJ injection Inject 0.3 mg  into the muscle as needed for anaphylaxis. (Patient not taking: Reported on 03/14/2022) 1 each 2   ibuprofen (ADVIL) 600 MG tablet Take 1 tablet (600 mg total) by mouth every 6 (six) hours as needed. 30 tablet 0   iron polysaccharides (NIFEREX) 150 MG capsule Take 1 capsule (150 mg total) by mouth 2 (two) times daily. 60 capsule 5   Prenatal Vit-Fe Fumarate-FA (PRENATAL MULTIVITAMIN) TABS tablet Take 2 tablets by mouth daily at 12 noon.     No current facility-administered medications for this visit.    REVIEW OF SYSTEMS:    10 Point review of Systems was done is negative except as noted above.   PHYSICAL EXAMINATION: .BP 115/64 (BP Location: Left Arm, Patient Position: Sitting)   Pulse 82   Temp 97.8 F (36.6 C) (Oral)   Resp 16   Ht 5\' 3"  (1.6 m)   Wt 109 lb 9.6 oz (49.7 kg)   SpO2 100%   BMI 19.41 kg/m  GENERAL:alert, in no acute distress and comfortable SKIN: no acute rashes, no significant lesions EYES: conjunctiva are pink and non-injected, sclera anicteric OROPHARYNX: MMM, no exudates, no oropharyngeal erythema or ulceration NECK: supple, no JVD LYMPH:  no palpable lymphadenopathy in the cervical, axillary or inguinal regions LUNGS: clear to auscultation b/l with normal respiratory effort HEART: regular rate & rhythm ABDOMEN:  normoactive bowel sounds , non tender, not distended. Extremity: no pedal edema PSYCH: alert & oriented x 3 with fluent speech NEURO: no focal motor/sensory deficits   LABORATORY DATA:  I have reviewed the data as listed     Latest Ref Rng & Units 10/10/2023   10:43 AM 04/15/2023   12:12 PM 10/14/2022    9:38 AM  CBC  WBC 4.0 - 10.5 K/uL 6.0  4.3  4.6   Hemoglobin 12.0 - 15.0 g/dL 16.1  09.6  04.5   Hematocrit 36.0 - 46.0 % 36.3  39.4  40.7   Platelets 150 - 400 K/uL 307  275  258       Latest Ref Rng & Units 10/10/2023   10:43 AM 04/15/2023   12:12 PM 10/14/2022    9:38 AM  CMP  Glucose 70 - 99 mg/dL 72  74  90   BUN 6 - 20 mg/dL 12   9  10    Creatinine 0.44 - 1.00 mg/dL 4.09  8.11  9.14   Sodium 135 - 145 mmol/L 139  140  139   Potassium 3.5 - 5.1 mmol/L 3.8  3.7  3.5   Chloride 98 - 111 mmol/L 107  106  104   CO2 22 - 32 mmol/L 28  29  29    Calcium 8.9 - 10.3 mg/dL 9.7  9.7  9.7   Total Protein 6.5 - 8.1 g/dL 7.7  7.6  7.4   Total Bilirubin 0.0 - 1.2 mg/dL 0.8  0.7  0.6   Alkaline Phos 38 - 126 U/L 70  58  67   AST 15 - 41 U/L 44  19  20   ALT 0 - 44 U/L 18  11  12     . Lab Results  Component Value Date   IRON 99 10/10/2023   TIBC 528 (H) 10/10/2023   IRONPCTSAT 19 10/10/2023   (Iron and TIBC)  Lab Results  Component Value Date   FERRITIN 6 (L) 10/10/2023   . Lab Results  Component Value Date   LDH 200 (H) 10/10/2023    RADIOGRAPHIC STUDIES: No images were reviewed during this visit.  ASSESSMENT & PLAN:   1. 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 The sample was sent for a second opinion and is awaiting further characterization for confirmation. -11/17/18 CT A/P which revealed "Enlarged presumed lymph node in the left inguinal region measuring 5.2 x 3.9 x 3.8 cm. There is enhancement of this lymph node. Etiology for this localized lymph node prominence is uncertain. Tissue sampling may well be warranted given this finding. No other adenopathy noted in the abdomen or pelvis. 2.  Spleen does not appear appreciably enlarged. 3. Wall thickening in the urinary bladder. Suspect a degree of cystitis. No renal or ureteral calculus. No hydronephrosis. 4. Apparent recent ovarian cyst rupture on the right with mild fluid tracking from the right adnexa and cul-de-sac. 5. No evident bowel obstruction or bowel wall thickening. No abscess in the abdomen pelvis. No periappendiceal region  inflammatory change." -01/11/2019 PET scan revealed "Bilateral axillary and right subpectoral adenopathy noted with Deauville 5 disease on the left and Deauville 4 disease on the right in this region. Diffuse uniform endometrial activity with SUV of 13.2. Although this is very high for physiologic endometrial activity, the diffuse uniform distribution is more characteristic of benign physiologic endometrial activity rather than malignancy. If the patient has abnormal uterine bleeding or if otherwise clinically warranted, pelvic sonography could be utilized for further characterization of the endometrium. Postoperative findings and low-grade metabolic activity in the vicinity of the excision biopsy of the left inguinal lymph node. No other hypermetabolic adenopathy in the abdomen/pelvis identified" -01/01/2019 biopsy sampling ([id number]) from the [facility] [place]  shows lymphocyte predominant Hodgkin's lymphoma. -04/27/2019 PET/CT scan ([id number]) which revealed "1. Significant response to therapy of lymphoma. Residual small bilateral axillary nodes with low-level hypermetabolism. (Deauville) 2. 2. 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 3. Right ovarian hypermetabolism is likely physiologic and may represent underlying corpus luteal cyst." --No lab or clinical evidence of NLPHL recurrence at this time. Will continue watchful observation.   2. Iron deficiency anemia 2/2 to menstrual bleeding and then from pregnancy -Currently on Zafemy transdermal patch with improvement of menstrual bleeding.  -Patient received IV venofer x 1 dose on 12/20/2020 but developed infusion reaction that required IV fluids, benadryl, steroids and ED evaluation  - severe anemia during pregnancy. Tolerated IV injectafer with resolution of anemia.   PLAN:  -Discussed lab results on 10/10/2023 in detail with patient. CBC showed WBC of 6.0K, hemoglobin of 11.7, and platelets of  307K. -her hgb has been decreasing over the last year, from 13.4 12 months ago, 12.7 five months ago, to 11.7 currently -MCV 72.6 -RDW increased to 15.9 with iron deficiency and ferritin very low at 6 -although there is no significant anemia at this time, there is  suggestion that she is headed towards that direction based on trend -eosinophils elevated at 1.0 likely due to seasonal allergies -iron labs show low ferritin at 6 -take oral iron replacement Iron polysaccharide 150mg  po daily per patients current preference to hold off on IV iron. -discussed that if there is concern for significant fatigue prior to her next visit in 6 months, there would be consideration of IV iron -She has no clinical signs or symptoms of Hodgkin's lymphoma progression at this time. -educated patient of potential common triggers for migraines, including anemia, sleep depreiviation, caffeine, certain fermented cheeses, birth control, allergies, and dehydration -discussed option of mirena IUD to control menstrual cycles if there is concern that estrogen patch may be causing headaches -discussed that in rare circumstances, especially with smoking, there can be blood clots on the surface of the brain causing headaches, called Cerebral venous sinus thrombosis -recommend connecting with PCP to address her headaches to consider  migraine prophylaxis medication if headaches are persistent despite addressing triggers. Discussed that if there are persistent headaches, there may be a role for a head scan -recommend drinking 64 ounces of water daily -patient shall return to clinic in 6 months   3. Hyperpigmented lesion on the leg -She has an upcoming dermatology appointment on 10/28/2023 with Louana Roup, DO.   FOLLOW-UP: RTC with Dr Salomon Cree with labs in 6 months  The total time spent in the appointment was 30 minutes* .  All of the patient's questions were answered with apparent satisfaction. The patient knows to call the  clinic with any problems, questions or concerns.   Jacquelyn Matt MD MS AAHIVMS [facility] Hematology/Oncology Physician [facility]  .*Total Encounter Time as defined by the Centers for Medicare and Medicaid Services includes, in addition to the face-to-face time of a patient visit (documented in the note above) non-face-to-face time: obtaining and reviewing outside history, ordering and reviewing medications, tests or procedures, care coordination (communications with other health care professionals or caregivers) and documentation in the medical record.    I,Mitra Faeizi,acting as a Neurosurgeon for Jacquelyn Matt, MD.,have documented all relevant documentation on the behalf of Jacquelyn Matt, MD,as directed by  Jacquelyn Matt, MD while in the presence of Jacquelyn Matt, MD.  .I have reviewed the above documentation for accuracy and completeness, and I agree with the above. .Alfonzo Arca Kishore Birda Didonato MD

## 2023-10-10 ENCOUNTER — Other Ambulatory Visit: Payer: Self-pay

## 2023-10-10 ENCOUNTER — Inpatient Hospital Stay: Attending: Internal Medicine

## 2023-10-10 ENCOUNTER — Inpatient Hospital Stay (HOSPITAL_BASED_OUTPATIENT_CLINIC_OR_DEPARTMENT_OTHER): Admitting: Hematology

## 2023-10-10 VITALS — BP 115/64 | HR 82 | Temp 97.8°F | Resp 16 | Ht 63.0 in | Wt 109.6 lb

## 2023-10-10 DIAGNOSIS — L819 Disorder of pigmentation, unspecified: Secondary | ICD-10-CM | POA: Diagnosis not present

## 2023-10-10 DIAGNOSIS — D500 Iron deficiency anemia secondary to blood loss (chronic): Secondary | ICD-10-CM

## 2023-10-10 DIAGNOSIS — N920 Excessive and frequent menstruation with regular cycle: Secondary | ICD-10-CM | POA: Insufficient documentation

## 2023-10-10 DIAGNOSIS — L989 Disorder of the skin and subcutaneous tissue, unspecified: Secondary | ICD-10-CM | POA: Insufficient documentation

## 2023-10-10 DIAGNOSIS — C8108 Nodular lymphocyte predominant Hodgkin lymphoma, lymph nodes of multiple sites: Secondary | ICD-10-CM

## 2023-10-10 DIAGNOSIS — C8190 Hodgkin lymphoma, unspecified, unspecified site: Secondary | ICD-10-CM | POA: Diagnosis present

## 2023-10-10 DIAGNOSIS — J45909 Unspecified asthma, uncomplicated: Secondary | ICD-10-CM | POA: Insufficient documentation

## 2023-10-10 LAB — CBC WITH DIFFERENTIAL (CANCER CENTER ONLY)
Abs Immature Granulocytes: 0.02 10*3/uL (ref 0.00–0.07)
Basophils Absolute: 0.1 10*3/uL (ref 0.0–0.1)
Basophils Relative: 2 %
Eosinophils Absolute: 1 10*3/uL — ABNORMAL HIGH (ref 0.0–0.5)
Eosinophils Relative: 16 %
HCT: 36.3 % (ref 36.0–46.0)
Hemoglobin: 11.7 g/dL — ABNORMAL LOW (ref 12.0–15.0)
Immature Granulocytes: 0 %
Lymphocytes Relative: 27 %
Lymphs Abs: 1.6 10*3/uL (ref 0.7–4.0)
MCH: 23.4 pg — ABNORMAL LOW (ref 26.0–34.0)
MCHC: 32.2 g/dL (ref 30.0–36.0)
MCV: 72.6 fL — ABNORMAL LOW (ref 80.0–100.0)
Monocytes Absolute: 0.5 10*3/uL (ref 0.1–1.0)
Monocytes Relative: 9 %
Neutro Abs: 2.7 10*3/uL (ref 1.7–7.7)
Neutrophils Relative %: 46 %
Platelet Count: 307 10*3/uL (ref 150–400)
RBC: 5 MIL/uL (ref 3.87–5.11)
RDW: 15.9 % — ABNORMAL HIGH (ref 11.5–15.5)
WBC Count: 6 10*3/uL (ref 4.0–10.5)
nRBC: 0 % (ref 0.0–0.2)

## 2023-10-10 LAB — CMP (CANCER CENTER ONLY)
ALT: 18 U/L (ref 0–44)
AST: 44 U/L — ABNORMAL HIGH (ref 15–41)
Albumin: 4.6 g/dL (ref 3.5–5.0)
Alkaline Phosphatase: 70 U/L (ref 38–126)
Anion gap: 4 — ABNORMAL LOW (ref 5–15)
BUN: 12 mg/dL (ref 6–20)
CO2: 28 mmol/L (ref 22–32)
Calcium: 9.7 mg/dL (ref 8.9–10.3)
Chloride: 107 mmol/L (ref 98–111)
Creatinine: 0.8 mg/dL (ref 0.44–1.00)
GFR, Estimated: >60 mL/min (ref >60)
Glucose, Bld: 72 mg/dL (ref 70–99)
Potassium: 3.8 mmol/L (ref 3.5–5.1)
Sodium: 139 mmol/L (ref 135–145)
Total Bilirubin: 0.8 mg/dL (ref 0.0–1.2)
Total Protein: 7.7 g/dL (ref 6.5–8.1)

## 2023-10-10 LAB — IRON AND IRON BINDING CAPACITY (CC-WL,HP ONLY)
Iron: 99 ug/dL (ref 28–170)
Saturation Ratios: 19 % (ref 10.4–31.8)
TIBC: 528 ug/dL — ABNORMAL HIGH (ref 250–450)
UIBC: 429 ug/dL (ref 148–442)

## 2023-10-10 LAB — FERRITIN: Ferritin: 6 ng/mL — ABNORMAL LOW (ref 11–307)

## 2023-10-10 LAB — LACTATE DEHYDROGENASE: LDH: 200 U/L — ABNORMAL HIGH (ref 98–192)

## 2023-10-13 ENCOUNTER — Telehealth: Payer: Self-pay | Admitting: Hematology

## 2023-10-13 NOTE — Telephone Encounter (Signed)
 Spoke with patient confirming upcoming appointment

## 2023-10-17 ENCOUNTER — Ambulatory Visit: Payer: Medicaid Other | Admitting: Hematology

## 2023-10-17 ENCOUNTER — Other Ambulatory Visit: Payer: Medicaid Other

## 2023-10-28 ENCOUNTER — Ambulatory Visit: Payer: Medicaid Other | Admitting: Dermatology

## 2023-12-22 ENCOUNTER — Other Ambulatory Visit: Payer: Self-pay

## 2023-12-22 ENCOUNTER — Other Ambulatory Visit: Payer: Self-pay | Admitting: Physician Assistant

## 2023-12-22 DIAGNOSIS — C8108 Nodular lymphocyte predominant Hodgkin lymphoma, lymph nodes of multiple sites: Secondary | ICD-10-CM

## 2023-12-22 DIAGNOSIS — R590 Localized enlarged lymph nodes: Secondary | ICD-10-CM

## 2023-12-22 DIAGNOSIS — D500 Iron deficiency anemia secondary to blood loss (chronic): Secondary | ICD-10-CM

## 2023-12-23 ENCOUNTER — Other Ambulatory Visit: Payer: Self-pay | Admitting: Hematology

## 2023-12-23 ENCOUNTER — Inpatient Hospital Stay: Attending: Internal Medicine

## 2023-12-23 ENCOUNTER — Inpatient Hospital Stay (HOSPITAL_BASED_OUTPATIENT_CLINIC_OR_DEPARTMENT_OTHER): Admitting: Hematology

## 2023-12-23 ENCOUNTER — Ambulatory Visit: Admitting: Hematology

## 2023-12-23 VITALS — BP 109/72 | HR 90 | Temp 97.3°F | Resp 20 | Wt 109.2 lb

## 2023-12-23 DIAGNOSIS — D500 Iron deficiency anemia secondary to blood loss (chronic): Secondary | ICD-10-CM | POA: Diagnosis not present

## 2023-12-23 DIAGNOSIS — R590 Localized enlarged lymph nodes: Secondary | ICD-10-CM

## 2023-12-23 DIAGNOSIS — C8108 Nodular lymphocyte predominant Hodgkin lymphoma, lymph nodes of multiple sites: Secondary | ICD-10-CM

## 2023-12-23 DIAGNOSIS — L989 Disorder of the skin and subcutaneous tissue, unspecified: Secondary | ICD-10-CM | POA: Insufficient documentation

## 2023-12-23 DIAGNOSIS — Z8571 Personal history of Hodgkin lymphoma: Secondary | ICD-10-CM | POA: Insufficient documentation

## 2023-12-23 DIAGNOSIS — N920 Excessive and frequent menstruation with regular cycle: Secondary | ICD-10-CM | POA: Diagnosis not present

## 2023-12-23 LAB — CMP (CANCER CENTER ONLY)
ALT: 11 U/L (ref 0–44)
AST: 17 U/L (ref 15–41)
Albumin: 4.1 g/dL (ref 3.5–5.0)
Alkaline Phosphatase: 54 U/L (ref 38–126)
Anion gap: 6 (ref 5–15)
BUN: 11 mg/dL (ref 6–20)
CO2: 28 mmol/L (ref 22–32)
Calcium: 9.2 mg/dL (ref 8.9–10.3)
Chloride: 107 mmol/L (ref 98–111)
Creatinine: 0.86 mg/dL (ref 0.44–1.00)
GFR, Estimated: >60 mL/min (ref >60)
Glucose, Bld: 77 mg/dL (ref 70–99)
Potassium: 3.4 mmol/L — ABNORMAL LOW (ref 3.5–5.1)
Sodium: 141 mmol/L (ref 135–145)
Total Bilirubin: 0.6 mg/dL (ref 0.0–1.2)
Total Protein: 7.3 g/dL (ref 6.5–8.1)

## 2023-12-23 LAB — CBC WITH DIFFERENTIAL (CANCER CENTER ONLY)
Abs Immature Granulocytes: 0.01 10*3/uL (ref 0.00–0.07)
Basophils Absolute: 0 10*3/uL (ref 0.0–0.1)
Basophils Relative: 0 %
Eosinophils Absolute: 0.1 10*3/uL (ref 0.0–0.5)
Eosinophils Relative: 1 %
HCT: 34.4 % — ABNORMAL LOW (ref 36.0–46.0)
Hemoglobin: 10.9 g/dL — ABNORMAL LOW (ref 12.0–15.0)
Immature Granulocytes: 0 %
Lymphocytes Relative: 28 %
Lymphs Abs: 1.4 10*3/uL (ref 0.7–4.0)
MCH: 23 pg — ABNORMAL LOW (ref 26.0–34.0)
MCHC: 31.7 g/dL (ref 30.0–36.0)
MCV: 72.7 fL — ABNORMAL LOW (ref 80.0–100.0)
Monocytes Absolute: 0.4 10*3/uL (ref 0.1–1.0)
Monocytes Relative: 8 %
Neutro Abs: 3.1 10*3/uL (ref 1.7–7.7)
Neutrophils Relative %: 63 %
Platelet Count: 207 10*3/uL (ref 150–400)
RBC: 4.73 MIL/uL (ref 3.87–5.11)
RDW: 16.8 % — ABNORMAL HIGH (ref 11.5–15.5)
WBC Count: 4.9 10*3/uL (ref 4.0–10.5)
nRBC: 0 % (ref 0.0–0.2)

## 2023-12-23 LAB — IRON AND IRON BINDING CAPACITY (CC-WL,HP ONLY)
Iron: 127 ug/dL (ref 28–170)
Saturation Ratios: 23 % (ref 10.4–31.8)
TIBC: 561 ug/dL — ABNORMAL HIGH (ref 250–450)
UIBC: 434 ug/dL (ref 148–442)

## 2023-12-23 LAB — LACTATE DEHYDROGENASE: LDH: 121 U/L (ref 98–192)

## 2023-12-23 LAB — FERRITIN: Ferritin: 6 ng/mL — ABNORMAL LOW (ref 11–307)

## 2023-12-23 NOTE — Progress Notes (Signed)
 HEMATOLOGY/ONCOLOGY CLINIC VISIT NOTE  Date of Service: 12/29/23   Patient Care Team: Leah Therisa MATSU, PA as PCP - General (Family Medicine)   CHIEF COMPLAINTS: -nodular Lymphocyte predominant hodgkins lymphoma -Iron  deficiency anemia  INTERVAL HISTORY:  Leah Gould is a 28 y.o. female here for continued evaluation and management of her nodular lymphocyte predominant Hodgkin's lymphoma and iron  deficiency anemia.  Patient was last seen by me on 10/10/2023 and reported a spot in the leg which had mildly increased in size with fluctuating thickening of the spot, mild fatigue, frequent ice cravings, seasonal allergies, frequent migraines present with sleep deprivation, and sharp localized pain on the top of her head.   She reports that she is doing well overall since her last clinical visit.   She reports that in the last 2 months, she had been taking iron  polysaccharide150 mg po daily twice daily. However, patient notes that she had not been taking iron  polysaccharide for the last few weeks at all due to forgetting. However, she has restarted her oral iron  more recently.   Patient reports that her periods have been regular, lasting 4-5 days, improved with birth control. She notes that she is on her period at this time. She is on weekly estrogen patch, 3 weeks on 1 week off, and has a period every 21 days.   Paitent denies any nose bleeds, gum bleeds, or visible blood in stools.   She has generally been eating well and her weight had been stable. Patient reports that she feels mildly sleepy sometimes.   Patient reports feeling a tiny enlarged lymph node in her bilateral axillaries under the skin. She notes that her axillary lymph node has been present for 2 months. Patient denies any other new lumps/bumps. She denies any ingrown hair in her axillares. Patient denies any enlarged lymph nodes in the groin. She denies any growing lumps in the breast.   She denies any back pain,  abdominal pain, change in bowel habits, leg swelling, skin rashes, fever, chills, or night sweats.   She saw her PCP last week, and is scheduled for breast US .   Patient reports that her children will be 4 and 2 in the next few months.   MEDICAL HISTORY:  Past Medical History:  Diagnosis Date   Anemia    Asthma    no meds, allergy induced   Hodgkin's lymphoma (HCC)    Inguinal adenopathy 12/22/2018    SURGICAL HISTORY: Past Surgical History:  Procedure Laterality Date   MASS EXCISION Left 12/22/2018   Procedure: EXCISION DEEP LEFT INGUINAL LYMPH NODE;  Surgeon: Leah Favorite, MD;  Location: [facility];  Service: General;  Laterality: Left;    SOCIAL HISTORY: Social History   Socioeconomic History   Marital status: Single    Spouse name: Not on file   Number of children: Not on file   Years of education: Not on file   Highest education level: Not on file  Occupational History   Not on file  Tobacco Use   Smoking status: Never   Smokeless tobacco: Never  Vaping Use   Vaping status: Never Used  Substance and Sexual Activity   Alcohol use: No   Drug use: No   Sexual activity: Not on file  Other Topics Concern   Not on file  Social History Narrative   Not on file   Social Drivers of Health   Financial Resource Strain: Not on file  Food Insecurity: Not on file  Transportation Needs: Not on file  Physical Activity: Not on file  Stress: Not on file  Social Connections: Not on file  Intimate Partner Violence: Not on file    FAMILY HISTORY: No family history on file.  ALLERGIES:  is allergic to fruit & vegetable daily [nutritional supplements], peanut-containing drug products, and iron  sucrose.  MEDICATIONS:  Current Outpatient Medications  Medication Sig Dispense Refill   EPINEPHrine  0.3 mg/0.3 mL IJ SOAJ injection Inject 0.3 mg into the muscle as needed for anaphylaxis. (Patient not taking: Reported on 03/14/2022) 1 each 2   ibuprofen  (ADVIL )  600 MG tablet Take 1 tablet (600 mg total) by mouth every 6 (six) hours as needed. 30 tablet 0   iron  polysaccharides (NIFEREX) 150 MG capsule Take 1 capsule (150 mg total) by mouth 2 (two) times daily. 60 capsule 5   Prenatal Vit-Fe Fumarate-FA (PRENATAL MULTIVITAMIN) TABS tablet Take 2 tablets by mouth daily at 12 noon.     No current facility-administered medications for this visit.    REVIEW OF SYSTEMS:    10 Point review of Systems was done is negative except as noted above.   PHYSICAL EXAMINATION: .BP 109/72   Pulse 90   Temp (!) 97.3 F (36.3 C)   Resp 20   Wt 109 lb 3.2 oz (49.5 kg)   SpO2 99%   BMI 19.34 kg/m  GENERAL:alert, in no acute distress and comfortable SKIN: no acute rashes, no significant lesions EYES: conjunctiva are pink and non-injected, sclera anicteric OROPHARYNX: MMM, no exudates, no oropharyngeal erythema or ulceration NECK: supple, no JVD LYMPH:  no palpable lymphadenopathy in the cervical, axillary or inguinal regions LUNGS: clear to auscultation b/l with normal respiratory effort HEART: regular rate & rhythm ABDOMEN:  normoactive bowel sounds , non tender, not distended. Extremity: no pedal edema PSYCH: alert & oriented x 3 with fluent speech NEURO: no focal motor/sensory deficits   LABORATORY DATA:  I have reviewed the data as listed     Latest Ref Rng & Units 12/23/2023   11:24 AM 10/10/2023   10:43 AM 04/15/2023   12:12 PM  CBC  WBC 4.0 - 10.5 K/uL 4.9  6.0  4.3   Hemoglobin 12.0 - 15.0 g/dL 89.0  88.2  87.2   Hematocrit 36.0 - 46.0 % 34.4  36.3  39.4   Platelets 150 - 400 K/uL 207  307  275       Latest Ref Rng & Units 12/23/2023   11:24 AM 10/10/2023   10:43 AM 04/15/2023   12:12 PM  CMP  Glucose 70 - 99 mg/dL 77  72  74   BUN 6 - 20 mg/dL 11  12  9    Creatinine 0.44 - 1.00 mg/dL 9.13  9.19  9.03   Sodium 135 - 145 mmol/L 141  139  140   Potassium 3.5 - 5.1 mmol/L 3.4  3.8  3.7   Chloride 98 - 111 mmol/L 107  107  106   CO2 22  - 32 mmol/L 28  28  29    Calcium 8.9 - 10.3 mg/dL 9.2  9.7  9.7   Total Protein 6.5 - 8.1 g/dL 7.3  7.7  7.6   Total Bilirubin 0.0 - 1.2 mg/dL 0.6  0.8  0.7   Alkaline Phos 38 - 126 U/L 54  70  58   AST 15 - 41 U/L 17  44  19   ALT 0 - 44 U/L 11  18  11     .  Lab Results  Component Value Date   IRON  127 12/23/2023   TIBC 561 (H) 12/23/2023   IRONPCTSAT 23 12/23/2023   (Iron  and TIBC)  Lab Results  Component Value Date   FERRITIN 6 (L) 12/23/2023   . Lab Results  Component Value Date   LDH 121 12/23/2023    RADIOGRAPHIC STUDIES: No images were reviewed during this visit.  ASSESSMENT & PLAN:   1. 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 The sample was sent for a second opinion and is awaiting further characterization for confirmation. -11/17/18 CT A/P which revealed Enlarged presumed lymph node in the left inguinal region measuring 5.2 x 3.9 x 3.8 cm. There is enhancement of this lymph node. Etiology for this localized lymph node prominence is uncertain. Tissue sampling may well be warranted given this finding. No other adenopathy noted in the abdomen or pelvis. 2.  Spleen does not appear appreciably enlarged. 3. Wall thickening in the urinary bladder. Suspect a degree of cystitis. No renal or ureteral calculus. No hydronephrosis. 4. Apparent recent ovarian cyst rupture on the right with mild fluid tracking from the right adnexa and cul-de-sac. 5. No evident bowel obstruction or bowel wall thickening. No abscess in the abdomen pelvis. No periappendiceal region inflammatory change. -01/11/2019 PET scan revealed Bilateral axillary and right subpectoral adenopathy noted with Deauville 5 disease on the left and Deauville 4 disease on the right in this  region. Diffuse uniform endometrial activity with SUV of 13.2. Although this is very high for physiologic endometrial activity, the diffuse uniform distribution is more characteristic of benign physiologic endometrial activity rather than malignancy. If the patient has abnormal uterine bleeding or if otherwise clinically warranted, pelvic sonography could be utilized for further characterization of the endometrium. Postoperative findings and low-grade metabolic activity in the vicinity of the excision biopsy of the left inguinal lymph node. No other hypermetabolic adenopathy in the abdomen/pelvis identified -01/01/2019 biopsy sampling ([id number]) from the [facility]  shows lymphocyte predominant Hodgkin's lymphoma. -04/27/2019 PET/CT scan ([id number]) which revealed 1. Significant response to therapy of lymphoma. Residual small bilateral axillary nodes with low-level hypermetabolism. (Deauville) 2. 2. 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 3. Right ovarian hypermetabolism is likely physiologic and may represent underlying corpus luteal cyst. --No lab or clinical evidence of NLPHL recurrence at this time. Will continue watchful observation.   2. Iron  deficiency anemia 2/2 to menstrual bleeding and then from pregnancy -Currently on Zafemy transdermal patch with improvement of menstrual bleeding.  -Patient received IV venofer  x 1 dose on 12/20/2020 but developed infusion reaction that required IV fluids, benadryl , steroids and ED evaluation  - severe anemia during pregnancy. Tolerated IV injectafer  with resolution of anemia.   PLAN:  -Discussed lab results on 12/23/23 in detail with patient. CBC showed WBC of 4.9K, hemoglobin of 10.9, and platelets of 207K. -patient is becoming more anemic -I did palpate an obviously new right axillary enlarged lymph node and very small cervical lymph nodes during physical examination -I did not feel an  enlarged liver or spleen during physical exam -discussed option to replace her iron  with oral iron  or IV iron  -discussed that if his iron  is still low despite her decent dose of oral iron , there may be a role for IV iron  -patient is agreeable to be  set up for IV iron  if her iron  labs are still low -Patient is scheduled [facility] We would also recommend core needle biopsy as well.  -discussed that there is a need for core needle biopsy instead of fine needle if there is concern for possibility of recurrent lymphoma -will plan for CT neck scan/CAP within the next 3 weeks as she gets her biopsy  -discussed that there can be breast changes with hormonal fluctuation such as with hormonal replacement and birth control  -discussed option to connect with her OB/GYN to discuss option of continuous birth control pills to help reduce her iron  needs  3. Hyperpigmented lesion on the leg -She has an upcoming dermatology appointment on 10/28/2023 with Leah Lenis, DO.   FOLLOW-UP: IV Monoferric 1000mg  x 1 dose Ct neck/CAP in 1-2 weeks RTC with Dr Gould in 4 weeks F/u as scheduled for US  guided LN bx on 7/15  The total time spent in the appointment was 32 minutes* .  All of the patient's questions were answered with apparent satisfaction. The patient knows to call the clinic with any problems, questions or concerns.   Leah Onesimo MD MS AAHIVMS [facility] Hematology/Oncology Physician [facility]  .*Total Encounter Time as defined by the Centers for Medicare and Medicaid Services includes, in addition to the face-to-face time of a patient visit (documented in the note above) non-face-to-face time: obtaining and reviewing outside history, ordering and reviewing medications, tests or procedures, care coordination (communications with other health care professionals or caregivers) and documentation in the medical record.    I,Leah  Gould,acting as a Neurosurgeon for Leah Onesimo, MD.,have documented all relevant documentation on the behalf of Leah Onesimo, MD,as directed by  Leah Onesimo, MD while in the presence of Leah Onesimo, MD.  .I have reviewed the above documentation for accuracy and completeness, and I agree with the above. .Georjean Toya Kishore Shantavia Jha MD

## 2023-12-25 ENCOUNTER — Other Ambulatory Visit: Payer: Self-pay | Admitting: Hematology

## 2023-12-29 ENCOUNTER — Ambulatory Visit
Admission: RE | Admit: 2023-12-29 | Discharge: 2023-12-29 | Disposition: A | Discharge status: Home / Self Care | Source: Ambulatory Visit | Attending: Physician Assistant | Admitting: Physician Assistant

## 2023-12-29 ENCOUNTER — Ambulatory Visit
Admission: RE | Admit: 2023-12-29 | Discharge: 2023-12-29 | Disposition: A | Discharge status: Home / Self Care | Source: Ambulatory Visit | Attending: Hematology | Admitting: Hematology

## 2023-12-29 ENCOUNTER — Other Ambulatory Visit: Payer: Self-pay | Admitting: Hematology

## 2023-12-29 ENCOUNTER — Other Ambulatory Visit: Payer: Self-pay | Admitting: Physician Assistant

## 2023-12-29 ENCOUNTER — Other Ambulatory Visit

## 2023-12-29 ENCOUNTER — Other Ambulatory Visit (HOSPITAL_COMMUNITY)
Admission: RE | Admit: 2023-12-29 | Discharge: 2023-12-29 | Disposition: A | Discharge status: Home / Self Care | Source: Ambulatory Visit | Attending: Hematology | Admitting: Hematology

## 2023-12-29 ENCOUNTER — Encounter: Payer: Self-pay | Admitting: Hematology

## 2023-12-29 DIAGNOSIS — R590 Localized enlarged lymph nodes: Secondary | ICD-10-CM

## 2023-12-30 ENCOUNTER — Telehealth: Payer: Self-pay

## 2023-12-30 ENCOUNTER — Other Ambulatory Visit (HOSPITAL_COMMUNITY): Payer: Self-pay | Admitting: Hematology

## 2023-12-30 DIAGNOSIS — D500 Iron deficiency anemia secondary to blood loss (chronic): Secondary | ICD-10-CM

## 2023-12-30 NOTE — Telephone Encounter (Signed)
 Dr. Onesimo, patient will be scheduled as soon as possible.  Auth Submission: NO AUTH NEEDED Site of care: Site of care: [facility] Payer: [employer] medicaid Medication & CPT/J Code(s) submitted: Monoferric (Ferrci derisomaltose) [phone] Diagnosis Code:  Route of submission (phone, fax, portal): portal Phone # Fax # Auth type: Buy/Bill PB Units/visits requested: 1000mg  x 1 dose Reference number:  Approval from: 12/30/23 to 05/01/24

## 2024-01-01 LAB — SURGICAL PATHOLOGY

## 2024-01-09 ENCOUNTER — Inpatient Hospital Stay (HOSPITAL_COMMUNITY): Admission: RE | Admit: 2024-01-09 | Source: Ambulatory Visit

## 2024-01-13 ENCOUNTER — Other Ambulatory Visit

## 2024-01-16 ENCOUNTER — Encounter: Payer: Self-pay | Admitting: Advanced Practice Midwife

## 2024-01-23 ENCOUNTER — Ambulatory Visit (HOSPITAL_COMMUNITY)
Admission: RE | Admit: 2024-01-23 | Discharge: 2024-01-23 | Disposition: A | Discharge status: Home / Self Care | Source: Ambulatory Visit | Attending: Hematology | Admitting: Hematology

## 2024-01-23 DIAGNOSIS — C8108 Nodular lymphocyte predominant Hodgkin lymphoma, lymph nodes of multiple sites: Secondary | ICD-10-CM | POA: Insufficient documentation

## 2024-01-23 MED ORDER — IOHEXOL 300 MG/ML  SOLN
100.0000 mL | Freq: Once | INTRAMUSCULAR | Status: AC | PRN
  Administered 2024-01-23: 100 mL via INTRAVENOUS

## 2024-01-23 MED ORDER — IOHEXOL 9 MG/ML PO SOLN
1000.0000 mL | ORAL | Status: AC
  Administered 2024-01-23: 1000 mL via ORAL

## 2024-01-29 ENCOUNTER — Other Ambulatory Visit: Payer: Self-pay

## 2024-01-29 DIAGNOSIS — D500 Iron deficiency anemia secondary to blood loss (chronic): Secondary | ICD-10-CM

## 2024-01-29 DIAGNOSIS — C8108 Nodular lymphocyte predominant Hodgkin lymphoma, lymph nodes of multiple sites: Secondary | ICD-10-CM

## 2024-01-29 NOTE — Progress Notes (Signed)
 HEMATOLOGY/ONCOLOGY CLINIC VISIT NOTE  Date of Service: 01/30/24   Patient Care Team: Alben Therisa MATSU, PA as PCP - General (Family Medicine)   CHIEF COMPLAINTS: -nodular Lymphocyte predominant hodgkins lymphoma -Iron  deficiency anemia  INTERVAL HISTORY:  [name] is a 28 y.o. female here for continued evaluation and management of her nodular lymphocyte predominant Hodgkin's lymphoma and iron  deficiency anemia.  Patient was last seen by me on 12/23/2023 and reported mild fatigue sometimes and noticing a tiny enlarged lymph node in her bilateral axillaries under the skin. She also reported that her menstrual cycles improved with birth control.   Patient was not able to show up to her previously scheduled IV iron  and will reschedule.   She is accompanied by her mother during today's visit. Her energy levels have been normal.   Patient is noted to have seasonal eczema issues, and does note recent flare mainly between her legs. She also has hx of eczema spots in her arms, but has not had any in the upper extremities for the last few months. Patient notes that she anticipates having rashes in her arms soon. She is noted to have a left-sided eczema spot on her neck at this time as well.   Patient reports that she still feels stable lymph nodes in her bilateral axillaries. She notes being told that her left axillary lymph node is bigger but deeper in the tissue.  Patient reports some intermittent pain from her biopsy.   Patient denies having any infection issues over the last 2-3 months, including viral infections, COVID-19, flu, significant respiratory infection, or stomach bug.   She reports having regular periods without heavy menstrual losses at this time.   MEDICAL HISTORY:  Past Medical History:  Diagnosis Date   Anemia    Asthma    no meds, allergy induced   Hodgkin's lymphoma (HCC)    Inguinal adenopathy 12/22/2018    SURGICAL HISTORY: Past Surgical History:   Procedure Laterality Date   MASS EXCISION Left 12/22/2018   Procedure: EXCISION DEEP LEFT INGUINAL LYMPH NODE;  Surgeon: Gail Favorite, MD;  Location: [facility];  Service: General;  Laterality: Left;    SOCIAL HISTORY: Social History   Socioeconomic History   Marital status: Single    Spouse name: Not on file   Number of children: Not on file   Years of education: Not on file   Highest education level: Not on file  Occupational History   Not on file  Tobacco Use   Smoking status: Never   Smokeless tobacco: Never  Vaping Use   Vaping status: Never Used  Substance and Sexual Activity   Alcohol use: No   Drug use: No   Sexual activity: Not on file  Other Topics Concern   Not on file  Social History Narrative   Not on file   Social Drivers of Health   Financial Resource Strain: Not on file  Food Insecurity: Not on file  Transportation Needs: Not on file  Physical Activity: Not on file  Stress: Not on file  Social Connections: Not on file  Intimate Partner Violence: Not on file    FAMILY HISTORY: No family history on file.  ALLERGIES:  is allergic to fruit & vegetable daily [nutritional supplements], peanut-containing drug products, and iron  sucrose.  MEDICATIONS:  Current Outpatient Medications  Medication Sig Dispense Refill   EPINEPHrine  0.3 mg/0.3 mL IJ SOAJ injection Inject 0.3 mg into the muscle as needed for anaphylaxis. (Patient not  taking: Reported on 03/14/2022) 1 each 2   ibuprofen  (ADVIL ) 600 MG tablet Take 1 tablet (600 mg total) by mouth every 6 (six) hours as needed. 30 tablet 0   iron  polysaccharides (NIFEREX) 150 MG capsule Take 1 capsule (150 mg total) by mouth 2 (two) times daily. 60 capsule 5   Prenatal Vit-Fe Fumarate-FA (PRENATAL MULTIVITAMIN) TABS tablet Take 2 tablets by mouth daily at 12 noon.     No current facility-administered medications for this visit.    REVIEW OF SYSTEMS:    10 Point review of Systems was done is  negative except as noted above.   PHYSICAL EXAMINATION: .There were no vitals taken for this visit.  GENERAL:alert, in no acute distress and comfortable SKIN: no acute rashes, no significant lesions EYES: conjunctiva are pink and non-injected, sclera anicteric OROPHARYNX: MMM, no exudates, no oropharyngeal erythema or ulceration NECK: supple, no JVD LYMPH:  no palpable lymphadenopathy in the cervical, axillary or inguinal regions LUNGS: clear to auscultation b/l with normal respiratory effort HEART: regular rate & rhythm ABDOMEN:  normoactive bowel sounds , non tender, not distended. Extremity: no pedal edema PSYCH: alert & oriented x 3 with fluent speech NEURO: no focal motor/sensory deficits   LABORATORY DATA:  I have reviewed the data as listed     Latest Ref Rng & Units 12/23/2023   11:24 AM 10/10/2023   10:43 AM 04/15/2023   12:12 PM  CBC  WBC 4.0 - 10.5 K/uL 4.9  6.0  4.3   Hemoglobin 12.0 - 15.0 g/dL 89.0  88.2  87.2   Hematocrit 36.0 - 46.0 % 34.4  36.3  39.4   Platelets 150 - 400 K/uL 207  307  275       Latest Ref Rng & Units 12/23/2023   11:24 AM 10/10/2023   10:43 AM 04/15/2023   12:12 PM  CMP  Glucose 70 - 99 mg/dL 77  72  74   BUN 6 - 20 mg/dL 11  12  9    Creatinine 0.44 - 1.00 mg/dL 9.13  9.19  9.03   Sodium 135 - 145 mmol/L 141  139  140   Potassium 3.5 - 5.1 mmol/L 3.4  3.8  3.7   Chloride 98 - 111 mmol/L 107  107  106   CO2 22 - 32 mmol/L 28  28  29    Calcium 8.9 - 10.3 mg/dL 9.2  9.7  9.7   Total Protein 6.5 - 8.1 g/dL 7.3  7.7  7.6   Total Bilirubin 0.0 - 1.2 mg/dL 0.6  0.8  0.7   Alkaline Phos 38 - 126 U/L 54  70  58   AST 15 - 41 U/L 17  44  19   ALT 0 - 44 U/L 11  18  11     . Lab Results  Component Value Date   IRON  127 12/23/2023   TIBC 561 (H) 12/23/2023   IRONPCTSAT 23 12/23/2023   (Iron  and TIBC)  Lab Results  Component Value Date   FERRITIN 6 (L) 12/23/2023   . Lab Results  Component Value Date   LDH 121 12/23/2023    12/29/2023 right axilla lymph node needle-core biopsy          RADIOGRAPHIC STUDIES: No images were reviewed during this visit.  ASSESSMENT & PLAN:   28 y.o. female with:  1. 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 The sample was sent for a second opinion and is awaiting further characterization for confirmation. -11/17/18 CT A/P which revealed Enlarged presumed lymph node in the left inguinal region measuring 5.2 x 3.9 x 3.8 cm. There is enhancement of this lymph node. Etiology for this localized lymph node prominence is uncertain. Tissue sampling may well be warranted given this finding. No other adenopathy noted in the abdomen or pelvis. 2.  Spleen does not appear appreciably enlarged. 3. Wall thickening in the urinary bladder. Suspect a degree of cystitis. No renal or ureteral calculus. No hydronephrosis. 4. Apparent recent ovarian cyst rupture on the right with mild fluid tracking from the right adnexa and cul-de-sac. 5. No evident bowel obstruction or bowel wall thickening. No abscess in the abdomen pelvis. No periappendiceal region inflammatory change. -01/11/2019 PET scan revealed Bilateral axillary and right subpectoral adenopathy noted with Deauville 5 disease on the left and Deauville 4 disease on the right in this region. Diffuse uniform endometrial activity with SUV of 13.2. Although this is very high for physiologic endometrial activity, the diffuse uniform distribution is more characteristic of benign physiologic endometrial activity rather than malignancy. If the patient has abnormal uterine bleeding or if otherwise clinically warranted, pelvic sonography could be utilized for further characterization of the endometrium. Postoperative findings  and low-grade metabolic activity in the vicinity of the excision biopsy of the left inguinal lymph node. No other hypermetabolic adenopathy in the abdomen/pelvis identified -01/01/2019 biopsy sampling ([id number]) from the [facility] [place]  shows lymphocyte predominant Hodgkin's lymphoma. -04/27/2019 PET/CT scan ([id number]) which revealed 1. Significant response to therapy of lymphoma. Residual small bilateral axillary nodes with low-level hypermetabolism. (Deauville) 2. 2. 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 3. Right ovarian hypermetabolism is likely physiologic and may represent underlying corpus luteal cyst. --No lab or clinical evidence of NLPHL recurrence at this time. Will continue watchful observation.   2. Iron  deficiency anemia 2/2 to menstrual bleeding and then from pregnancy -Currently on Zafemy transdermal patch with improvement of menstrual bleeding.  -Patient received IV venofer  x 1 dose on 12/20/2020 but developed infusion reaction that required IV fluids, benadryl , steroids and ED evaluation  - severe anemia during pregnancy. Tolerated IV injectafer  with resolution of anemia.  3. Hyperpigmented lesion on the leg   PLAN:  -Discussed lab results on 01/30/24 in detail with patient. CBC showed WBC of 4.6K, hemoglobin of 11.7, and platelets of 283K. -01/23/2024 CT chest/abdomen/pelvis scan showed bilateral axillary adenopathy. There is no intrathoracic, abdominal or pelvic adenopathy. There is no organ involvement on imaging. Radiologist also noted some mild splenomegaly, though we discussed that this was likely overread. Her spleen is not concerning at this time.  -01/23/2024 CT soft tissue neck scan showed normal findings with no enlarged lymph nodes in neck -12/29/2023 right axilla lymph node needle/core biopsy showed no evidence of lymphoma. Discussed findings of reactive changes with follicular hyperplasia, which could  potentially be from viral infection, local infection, or other factor.  -Her left axillary lymph node is not easily palpable and her right axillary lymph node appears to be borderline and is not significantly prominent based on physical exam. I Did not feel any enlarged lymph nodes in her neck or collarbones.  -discussed that her underlying hodgkin's lymphoma is a slow-growing condition, which is CD20 positive -Discussed that given her hx of hodgkin's lymphoma, if findings are indeterminate, and lymph nodes  are persistent/grow, the way to definitively evaluate would be with an excisional biopsy.  -discussed that it is possible that reactive lymph nodes can persist for several months then disappear -discussed potential proceeding options: Monitoring lymph nodes with imaging and seeing if they persist or resolve on their own Further definitive evaluation with an excisional biopsy of lymph node -patient is inclined to proceed with an excisional lymph node biopsy -will place [facility] for [facility] LN biopsy in 1-2 weeks -discussed that eczema rashes in areas including the arms, chest, and upper back can drain into the axillaries and cause enlarged lymph nodes -patient did not receive previously scheduled IV iron  and plans to reschedule -patient shall reschedule her IV iron  replacement, especially given considerations of upcoming surgical biopsy -answered all of patient's and her mother's questions in detail  FOLLOW-UP: Referral [facility] for axillary LN biopsy in 1-2 weeks RTC with Dr Onesimo in 4-5 weeks  The total time spent in the appointment was *** minutes* .  All of the patient's questions were answered with apparent satisfaction. The patient knows to call the clinic with any problems, questions or concerns.   Emaline Onesimo MD MS AAHIVMS [facility] Hematology/Oncology Physician [facility]  .*Total Encounter Time as defined by the  Centers for Medicare and Medicaid Services includes, in addition to the face-to-face time of a patient visit (documented in the note above) non-face-to-face time: obtaining and reviewing outside history, ordering and reviewing medications, tests or procedures, care coordination (communications with other health care professionals or caregivers) and documentation in the medical record.    I,Mitra Faeizi,acting as a Neurosurgeon for Emaline Onesimo, MD.,have documented all relevant documentation on the behalf of Emaline Onesimo, MD,as directed by  Emaline Onesimo, MD while in the presence of Emaline Onesimo, MD.  ***

## 2024-01-30 ENCOUNTER — Inpatient Hospital Stay: Attending: Internal Medicine

## 2024-01-30 ENCOUNTER — Inpatient Hospital Stay (HOSPITAL_BASED_OUTPATIENT_CLINIC_OR_DEPARTMENT_OTHER): Admitting: Hematology

## 2024-01-30 VITALS — BP 103/63 | HR 79 | Temp 98.1°F | Resp 20 | Wt 106.7 lb

## 2024-01-30 DIAGNOSIS — C8108 Nodular lymphocyte predominant Hodgkin lymphoma, lymph nodes of multiple sites: Secondary | ICD-10-CM

## 2024-01-30 DIAGNOSIS — D500 Iron deficiency anemia secondary to blood loss (chronic): Secondary | ICD-10-CM | POA: Diagnosis not present

## 2024-01-30 DIAGNOSIS — Z8571 Personal history of Hodgkin lymphoma: Secondary | ICD-10-CM | POA: Insufficient documentation

## 2024-01-30 DIAGNOSIS — L989 Disorder of the skin and subcutaneous tissue, unspecified: Secondary | ICD-10-CM | POA: Insufficient documentation

## 2024-01-30 DIAGNOSIS — R590 Localized enlarged lymph nodes: Secondary | ICD-10-CM

## 2024-01-30 DIAGNOSIS — N920 Excessive and frequent menstruation with regular cycle: Secondary | ICD-10-CM | POA: Insufficient documentation

## 2024-01-30 LAB — CBC WITH DIFFERENTIAL (CANCER CENTER ONLY)
Abs Immature Granulocytes: 0 K/uL (ref 0.00–0.07)
Basophils Absolute: 0.1 K/uL (ref 0.0–0.1)
Basophils Relative: 2 %
Eosinophils Absolute: 0.1 K/uL (ref 0.0–0.5)
Eosinophils Relative: 3 %
HCT: 36.1 % (ref 36.0–46.0)
Hemoglobin: 11.7 g/dL — ABNORMAL LOW (ref 12.0–15.0)
Immature Granulocytes: 0 %
Lymphocytes Relative: 39 %
Lymphs Abs: 1.8 K/uL (ref 0.7–4.0)
MCH: 23.5 pg — ABNORMAL LOW (ref 26.0–34.0)
MCHC: 32.4 g/dL (ref 30.0–36.0)
MCV: 72.6 fL — ABNORMAL LOW (ref 80.0–100.0)
Monocytes Absolute: 0.4 K/uL (ref 0.1–1.0)
Monocytes Relative: 9 %
Neutro Abs: 2.2 K/uL (ref 1.7–7.7)
Neutrophils Relative %: 47 %
Platelet Count: 283 K/uL (ref 150–400)
RBC: 4.97 MIL/uL (ref 3.87–5.11)
RDW: 17.3 % — ABNORMAL HIGH (ref 11.5–15.5)
WBC Count: 4.6 K/uL (ref 4.0–10.5)
nRBC: 0 % (ref 0.0–0.2)

## 2024-01-30 LAB — CMP (CANCER CENTER ONLY)
ALT: 12 U/L (ref 0–44)
AST: 19 U/L (ref 15–41)
Albumin: 4.2 g/dL (ref 3.5–5.0)
Alkaline Phosphatase: 53 U/L (ref 38–126)
Anion gap: 5 (ref 5–15)
BUN: 13 mg/dL (ref 6–20)
CO2: 31 mmol/L (ref 22–32)
Calcium: 9.5 mg/dL (ref 8.9–10.3)
Chloride: 105 mmol/L (ref 98–111)
Creatinine: 0.96 mg/dL (ref 0.44–1.00)
GFR, Estimated: >60 mL/min (ref >60)
Glucose, Bld: 70 mg/dL (ref 70–99)
Potassium: 4 mmol/L (ref 3.5–5.1)
Sodium: 141 mmol/L (ref 135–145)
Total Bilirubin: 0.6 mg/dL (ref 0.0–1.2)
Total Protein: 7.2 g/dL (ref 6.5–8.1)

## 2024-01-30 LAB — IRON AND IRON BINDING CAPACITY (CC-WL,HP ONLY)
Iron: 71 ug/dL (ref 28–170)
Saturation Ratios: 13 % (ref 10.4–31.8)
TIBC: 549 ug/dL — ABNORMAL HIGH (ref 250–450)
UIBC: 478 ug/dL — ABNORMAL HIGH (ref 148–442)

## 2024-01-30 LAB — FERRITIN: Ferritin: 6 ng/mL — ABNORMAL LOW (ref 11–307)

## 2024-01-30 LAB — LACTATE DEHYDROGENASE: LDH: 143 U/L (ref 98–192)

## 2024-02-17 ENCOUNTER — Other Ambulatory Visit: Payer: Self-pay | Admitting: Surgery

## 2024-03-08 ENCOUNTER — Encounter (HOSPITAL_COMMUNITY): Payer: Self-pay | Admitting: Surgery

## 2024-03-08 ENCOUNTER — Other Ambulatory Visit: Payer: Self-pay

## 2024-03-08 DIAGNOSIS — C8108 Nodular lymphocyte predominant Hodgkin lymphoma, lymph nodes of multiple sites: Secondary | ICD-10-CM

## 2024-03-08 NOTE — Progress Notes (Signed)
 SDW CALL  Patient was given pre-op instructions over the phone. The opportunity was given for the patient to ask questions. No further questions asked. Patient verbalized understanding of instructions given.  Date & Arrival time- March 10, 2024 @ 6:00 am   PCP - Alben Therisa MATSU, PA  Cardiologist -   PPM/ICD - denies Device Orders - n/a Rep Notified - n/a  Chest x-ray - denies EKG - denies Stress Test -  ECHO - 01-07-19 Cardiac Cath - denies  Sleep Study - denies CPAP - n/a  DM -denies   Blood Thinner Instructions:denies Aspirin Instructions:n/a   ERAS Protcol - clear liquids until 5:30 am.   COVID TEST- n/a   Anesthesia review: no  Patient denies shortness of breath, fever, cough and chest pain over the phone call   All instructions explained to the patient, with a verbal understanding of the material. Patient agrees to go over the instructions while at home for a better understanding.

## 2024-03-09 ENCOUNTER — Other Ambulatory Visit: Payer: Self-pay

## 2024-03-09 ENCOUNTER — Inpatient Hospital Stay: Attending: Internal Medicine

## 2024-03-09 ENCOUNTER — Inpatient Hospital Stay: Admitting: Hematology

## 2024-03-09 DIAGNOSIS — Z8571 Personal history of Hodgkin lymphoma: Secondary | ICD-10-CM | POA: Diagnosis present

## 2024-03-09 DIAGNOSIS — C8108 Nodular lymphocyte predominant Hodgkin lymphoma, lymph nodes of multiple sites: Secondary | ICD-10-CM

## 2024-03-09 DIAGNOSIS — D500 Iron deficiency anemia secondary to blood loss (chronic): Secondary | ICD-10-CM | POA: Diagnosis not present

## 2024-03-09 DIAGNOSIS — N920 Excessive and frequent menstruation with regular cycle: Secondary | ICD-10-CM | POA: Diagnosis not present

## 2024-03-09 LAB — CBC WITH DIFFERENTIAL (CANCER CENTER ONLY)
Abs Immature Granulocytes: 0.01 K/uL (ref 0.00–0.07)
Basophils Absolute: 0.1 K/uL (ref 0.0–0.1)
Basophils Relative: 1 %
Eosinophils Absolute: 0.1 K/uL (ref 0.0–0.5)
Eosinophils Relative: 2 %
HCT: 37.8 % (ref 36.0–46.0)
Hemoglobin: 12.3 g/dL (ref 12.0–15.0)
Immature Granulocytes: 0 %
Lymphocytes Relative: 34 %
Lymphs Abs: 1.8 K/uL (ref 0.7–4.0)
MCH: 24.1 pg — ABNORMAL LOW (ref 26.0–34.0)
MCHC: 32.5 g/dL (ref 30.0–36.0)
MCV: 74 fL — ABNORMAL LOW (ref 80.0–100.0)
Monocytes Absolute: 0.4 K/uL (ref 0.1–1.0)
Monocytes Relative: 7 %
Neutro Abs: 3 K/uL (ref 1.7–7.7)
Neutrophils Relative %: 56 %
Platelet Count: 264 K/uL (ref 150–400)
RBC: 5.11 MIL/uL (ref 3.87–5.11)
RDW: 17 % — ABNORMAL HIGH (ref 11.5–15.5)
WBC Count: 5.3 K/uL (ref 4.0–10.5)
nRBC: 0 % (ref 0.0–0.2)

## 2024-03-09 LAB — CMP (CANCER CENTER ONLY)
ALT: 15 U/L (ref 0–44)
AST: 22 U/L (ref 15–41)
Albumin: 4.4 g/dL (ref 3.5–5.0)
Alkaline Phosphatase: 62 U/L (ref 38–126)
Anion gap: 5 (ref 5–15)
BUN: 9 mg/dL (ref 6–20)
CO2: 29 mmol/L (ref 22–32)
Calcium: 9.5 mg/dL (ref 8.9–10.3)
Chloride: 105 mmol/L (ref 98–111)
Creatinine: 0.89 mg/dL (ref 0.44–1.00)
GFR, Estimated: >60 mL/min (ref >60)
Glucose, Bld: 72 mg/dL (ref 70–99)
Potassium: 3.7 mmol/L (ref 3.5–5.1)
Sodium: 139 mmol/L (ref 135–145)
Total Bilirubin: 0.8 mg/dL (ref 0.0–1.2)
Total Protein: 7.6 g/dL (ref 6.5–8.1)

## 2024-03-09 LAB — IRON AND IRON BINDING CAPACITY (CC-WL,HP ONLY)
Iron: 65 ug/dL (ref 28–170)
Saturation Ratios: 11 % (ref 10.4–31.8)
TIBC: 575 ug/dL — ABNORMAL HIGH (ref 250–450)
UIBC: 510 ug/dL — ABNORMAL HIGH (ref 148–442)

## 2024-03-09 LAB — FERRITIN: Ferritin: 7 ng/mL — ABNORMAL LOW (ref 11–307)

## 2024-03-09 LAB — LACTATE DEHYDROGENASE: LDH: 154 U/L (ref 98–192)

## 2024-03-09 NOTE — Anesthesia Preprocedure Evaluation (Signed)
 Anesthesia Evaluation  Patient identified by MRN, date of birth, ID band Patient awake    Reviewed: Allergy & Precautions, H&P , NPO status , Patient's Chart, lab work & pertinent test results  History of Anesthesia Complications Negative for: history of anesthetic complications  Airway Mallampati: II  TM Distance: >3 FB Neck ROM: Full    Dental no notable dental hx.    Pulmonary asthma    Pulmonary exam normal breath sounds clear to auscultation       Cardiovascular negative cardio ROS Normal cardiovascular exam Rhythm:Regular Rate:Normal     Neuro/Psych negative neurological ROS  negative psych ROS   GI/Hepatic negative GI ROS, Neg liver ROS,,,  Endo/Other  negative endocrine ROS    Renal/GU negative Renal ROS  negative genitourinary   Musculoskeletal negative musculoskeletal ROS (+)    Abdominal   Peds negative pediatric ROS (+)  Hematology Nodular lymphocyte predominant Hodgkin lymphoma of lymph nodes of multiple sites   Anesthesia Other Findings   Reproductive/Obstetrics negative OB ROS                              Anesthesia Physical Anesthesia Plan  ASA: 2  Anesthesia Plan: General   Post-op Pain Management: Tylenol  PO (pre-op)*   Induction: Intravenous  PONV Risk Score and Plan: 3 and Ondansetron , Dexamethasone , Treatment may vary due to age or medical condition and Midazolam   Airway Management Planned: LMA  Additional Equipment: None  Intra-op Plan:   Post-operative Plan: Extubation in OR  Informed Consent: I have reviewed the patients History and Physical, chart, labs and discussed the procedure including the risks, benefits and alternatives for the proposed anesthesia with the patient or authorized representative who has indicated his/her understanding and acceptance.     Dental advisory given  Plan Discussed with: CRNA  Anesthesia Plan Comments:           Anesthesia Quick Evaluation

## 2024-03-09 NOTE — H&P (Signed)
 REFERRING PHYSICIAN: Onesimo Emaline Brink, MD PROVIDER: VICENTA DASIE POLI, MD MRN: [id number] DOB: [birth year] DATE OF ENCOUNTER: 02/17/2024 Subjective   Chief Complaint: New Consultation  History of Present Illness: Leah Gould is a 28 y.o. female who is seen today as an office consultation for evaluation of New Consultation  This is a 28 year old female with a history of iron  deficiency anemia and nodular lymphocytic prominent Hodgkin's lymphoma first diagnosed in 2020 who is here for evaluation of adenopathy and consideration of a lymph node biopsy. She received immunotherapy for her previous diagnosis and is followed closely by her oncologist, Dr. Onesimo. On a recent CT of her chest, there was bilateral axillary adenopathy identified with at least a 2 cm lymph node visible on each side. She had a needle biopsy of a lymph node in the left axilla which was reactive with follicular hyperplasia but no evidence of malignancy. She reports she still has stable adenopathy in her axillas sometimes the lymph nodes will get larger in size. Excisional biopsy of one of the lymph nodes was recommended versus continued follow-up.  Review of Systems: A complete review of systems was obtained from the patient. I have reviewed this information and discussed as appropriate with the patient. See HPI as well for other ROS.  ROS   Medical History: Past Medical History:  Diagnosis Date  Anemia  Asthma, unspecified asthma severity, unspecified whether complicated, unspecified whether persistent (HHS-HCC)  History of cancer   There is no problem list on file for this patient.  Past Surgical History:  Procedure Laterality Date  Right Lymphnode removal 09/2018    No Known Allergies  No current outpatient medications on file prior to visit.   No current facility-administered medications on file prior to visit.   History reviewed. No pertinent family history.   Social History   Tobacco Use   Smoking Status Never  Smokeless Tobacco Never    Social History   Socioeconomic History  Marital status: Single  Tobacco Use  Smoking status: Never  Smokeless tobacco: Never  Vaping Use  Vaping status: Never Used  Substance and Sexual Activity  Alcohol use: Never  Drug use: Never   Objective:   Vitals:  02/17/24 1400  BP: 107/73  Pulse: 98  Temp: 37.1 C (98.8 F)  SpO2: 99%  Weight: 49.8 kg (109 lb 12.8 oz)  Height: 160 cm (5' 3)  PainSc: 0-No pain   Body mass index is 19.45 kg/m.  Physical Exam   She appears well on exam.  There is some shotty adenopathy in both axilla with a large left could feel on the right side. Her axilla are nontender.  Labs, Imaging and Diagnostic Testing: I have reviewed her notes from the oncologist as well as her CT scan and previous biopsy results  Assessment and Plan:   Diagnoses and all orders for this visit:  Axillary lymphadenopathy   She has had a long discussion with oncology and discussed things with her family as well. She would like to proceed with an excisional biopsy for complete histologic evaluation of one of the lymph nodes in the axilla which I feel is very reasonable. The most prominent one that I can feel would be in the right axilla and given her previous needle biopsy in the left I would proceed with excisional biopsy of lymph nodes in her right axilla. We explained the surgical procedure in detail. We discussed the risks which includes but is not limited to bleeding, infection, injury to surrounding structures, lymphedema,  numbness and tingling in the arm and axilla, the lymph node biopsy being nondiagnostic, postoperative recovery, etc. She understands and wished to proceed with surgery which will be scheduled.

## 2024-03-10 ENCOUNTER — Ambulatory Visit (HOSPITAL_COMMUNITY): Payer: Self-pay | Admitting: Anesthesiology

## 2024-03-10 ENCOUNTER — Other Ambulatory Visit: Payer: Self-pay

## 2024-03-10 ENCOUNTER — Ambulatory Visit (HOSPITAL_COMMUNITY)
Admission: RE | Admit: 2024-03-10 | Discharge: 2024-03-10 | Disposition: A | Discharge status: Home / Self Care | Attending: Surgery | Admitting: Surgery

## 2024-03-10 ENCOUNTER — Encounter (HOSPITAL_COMMUNITY): Payer: Self-pay | Admitting: Surgery

## 2024-03-10 ENCOUNTER — Encounter (HOSPITAL_COMMUNITY)
Admission: RE | Disposition: A | Discharge status: Home / Self Care | Payer: Self-pay | Source: Home / Self Care | Attending: Surgery

## 2024-03-10 DIAGNOSIS — J45909 Unspecified asthma, uncomplicated: Secondary | ICD-10-CM | POA: Insufficient documentation

## 2024-03-10 DIAGNOSIS — R590 Localized enlarged lymph nodes: Secondary | ICD-10-CM | POA: Insufficient documentation

## 2024-03-10 DIAGNOSIS — Z8571 Personal history of Hodgkin lymphoma: Secondary | ICD-10-CM | POA: Insufficient documentation

## 2024-03-10 DIAGNOSIS — D509 Iron deficiency anemia, unspecified: Secondary | ICD-10-CM | POA: Insufficient documentation

## 2024-03-10 HISTORY — PX: AXILLARY SENTINEL NODE BIOPSY: SHX5738

## 2024-03-10 LAB — POCT PREGNANCY, URINE: Preg Test, Ur: NEGATIVE

## 2024-03-10 SURGERY — BIOPSY, LYMPH NODE, SENTINEL, AXILLARY
Anesthesia: General | Laterality: Right

## 2024-03-10 MED ORDER — ONDANSETRON HCL 4 MG/2ML IJ SOLN
INTRAMUSCULAR | Status: DC | PRN
  Administered 2024-03-10: 4 mg via INTRAVENOUS

## 2024-03-10 MED ORDER — ACETAMINOPHEN 500 MG PO TABS
1000.0000 mg | ORAL_TABLET | ORAL | Status: AC
  Filled 2024-03-10: qty 2

## 2024-03-10 MED ORDER — CHLORHEXIDINE GLUCONATE 0.12 % MT SOLN
15.0000 mL | Freq: Once | OROMUCOSAL | Status: AC
  Administered 2024-03-10: 15 mL via OROMUCOSAL
  Filled 2024-03-10: qty 15

## 2024-03-10 MED ORDER — MIDAZOLAM HCL 2 MG/2ML IJ SOLN
INTRAMUSCULAR | Status: DC | PRN
  Administered 2024-03-10: 2 mg via INTRAVENOUS

## 2024-03-10 MED ORDER — ORAL CARE MOUTH RINSE: 15.0000 mL | Freq: Once | OROMUCOSAL | Status: AC

## 2024-03-10 MED ORDER — CHLORHEXIDINE GLUCONATE CLOTH 2 % EX PADS: 6.0000 | MEDICATED_PAD | Freq: Once | CUTANEOUS | Status: DC

## 2024-03-10 MED ORDER — LACTATED RINGERS IV SOLN: INTRAVENOUS | Status: DC

## 2024-03-10 MED ORDER — PHENYLEPHRINE 80 MCG/ML (10ML) SYRINGE FOR IV PUSH (FOR BLOOD PRESSURE SUPPORT)
PREFILLED_SYRINGE | INTRAVENOUS | Status: DC | PRN
  Administered 2024-03-10: 80 ug via INTRAVENOUS

## 2024-03-10 MED ORDER — FENTANYL CITRATE (PF) 100 MCG/2ML IJ SOLN
INTRAMUSCULAR | Status: AC
  Filled 2024-03-10: qty 2

## 2024-03-10 MED ORDER — CEFAZOLIN SODIUM-DEXTROSE 2-4 GM/100ML-% IV SOLN
2.0000 g | INTRAVENOUS | Status: AC
  Administered 2024-03-10: 2 g via INTRAVENOUS
  Filled 2024-03-10: qty 100

## 2024-03-10 MED ORDER — 0.9 % SODIUM CHLORIDE (POUR BTL) OPTIME
TOPICAL | Status: DC | PRN
  Administered 2024-03-10: 1000 mL

## 2024-03-10 MED ORDER — EPHEDRINE SULFATE-NACL 50-0.9 MG/10ML-% IV SOSY
PREFILLED_SYRINGE | INTRAVENOUS | Status: DC | PRN
  Administered 2024-03-10 (×2): 5 mg via INTRAVENOUS

## 2024-03-10 MED ORDER — FENTANYL CITRATE (PF) 250 MCG/5ML IJ SOLN
INTRAMUSCULAR | Status: DC | PRN
  Administered 2024-03-10: 50 ug via INTRAVENOUS
  Administered 2024-03-10: 25 ug via INTRAVENOUS

## 2024-03-10 MED ORDER — ENSURE PRE-SURGERY PO LIQD: 296.0000 mL | Freq: Once | ORAL | Status: DC

## 2024-03-10 MED ORDER — BUPIVACAINE-EPINEPHRINE (PF) 0.25% -1:200000 IJ SOLN
INTRAMUSCULAR | Status: AC
  Filled 2024-03-10: qty 30

## 2024-03-10 MED ORDER — CHLORHEXIDINE GLUCONATE CLOTH 2 % EX PADS
6.0000 | MEDICATED_PAD | Freq: Once | CUTANEOUS | Status: DC
Start: 2024-03-10 — End: 2024-03-10

## 2024-03-10 MED ORDER — PROPOFOL 10 MG/ML IV BOLUS
INTRAVENOUS | Status: AC
  Filled 2024-03-10: qty 20

## 2024-03-10 MED ORDER — MIDAZOLAM HCL 2 MG/2ML IJ SOLN
INTRAMUSCULAR | Status: AC
  Filled 2024-03-10: qty 2

## 2024-03-10 MED ORDER — CELECOXIB 200 MG PO CAPS
200.0000 mg | ORAL_CAPSULE | Freq: Once | ORAL | Status: AC
  Administered 2024-03-10: 200 mg via ORAL
  Filled 2024-03-10: qty 1

## 2024-03-10 MED ORDER — BUPIVACAINE-EPINEPHRINE 0.25% -1:200000 IJ SOLN
INTRAMUSCULAR | Status: DC | PRN
  Administered 2024-03-10: 10 mL

## 2024-03-10 MED ORDER — OXYCODONE HCL 5 MG PO TABS: 5.0000 mg | ORAL_TABLET | Freq: Four times a day (QID) | ORAL | 0 refills | Status: AC | PRN

## 2024-03-10 MED ORDER — DEXAMETHASONE SODIUM PHOSPHATE 10 MG/ML IJ SOLN
INTRAMUSCULAR | Status: DC | PRN
  Administered 2024-03-10: 10 mg via INTRAVENOUS

## 2024-03-10 MED ORDER — PROPOFOL 10 MG/ML IV BOLUS
INTRAVENOUS | Status: DC | PRN
  Administered 2024-03-10: 200 mg via INTRAVENOUS

## 2024-03-10 MED ORDER — ACETAMINOPHEN 500 MG PO TABS
500.0000 mg | ORAL_TABLET | Freq: Once | ORAL | Status: AC
  Administered 2024-03-10: 500 mg via ORAL

## 2024-03-10 MED ORDER — LIDOCAINE 2% (20 MG/ML) 5 ML SYRINGE
INTRAMUSCULAR | Status: DC | PRN
  Administered 2024-03-10: 60 mg via INTRAVENOUS

## 2024-03-10 SURGICAL SUPPLY — 26 items
APPLICATOR CHLORAPREP 10.5 ORG (MISCELLANEOUS) ×2 IMPLANT
BAG COUNTER SPONGE SURGICOUNT (BAG) ×2 IMPLANT
CANISTER SUCTION 3000ML PPV (SUCTIONS) IMPLANT
COVER SURGICAL LIGHT HANDLE (MISCELLANEOUS) ×2 IMPLANT
DERMABOND ADVANCED .7 DNX12 (GAUZE/BANDAGES/DRESSINGS) ×2 IMPLANT
DRAPE LAPAROTOMY 100X72 PEDS (DRAPES) ×2 IMPLANT
DRSG TEGADERM 4X4.75 (GAUZE/BANDAGES/DRESSINGS) ×2 IMPLANT
ELECTRODE REM PT RTRN 9FT ADLT (ELECTROSURGICAL) ×2 IMPLANT
GAUZE SPONGE 4X4 12PLY STRL LF (GAUZE/BANDAGES/DRESSINGS) ×2 IMPLANT
GLOVE SURG SIGNA 7.5 PF LTX (GLOVE) ×2 IMPLANT
GOWN STRL REUS W/ TWL LRG LVL3 (GOWN DISPOSABLE) ×2 IMPLANT
GOWN STRL REUS W/ TWL XL LVL3 (GOWN DISPOSABLE) ×2 IMPLANT
KIT BASIN OR (CUSTOM PROCEDURE TRAY) ×2 IMPLANT
KIT TURNOVER KIT B (KITS) ×2 IMPLANT
NDL FILTER BLUNT 18X1 1/2 (NEEDLE) IMPLANT
NEEDLE FILTER BLUNT 18X1 1/2 (NEEDLE) IMPLANT
NS IRRIG 1000ML POUR BTL (IV SOLUTION) ×2 IMPLANT
PACK GENERAL/GYN (CUSTOM PROCEDURE TRAY) ×2 IMPLANT
PAD ARMBOARD POSITIONER FOAM (MISCELLANEOUS) ×2 IMPLANT
PENCIL SMOKE EVACUATOR (MISCELLANEOUS) ×2 IMPLANT
SPECIMEN JAR MEDIUM (MISCELLANEOUS) ×2 IMPLANT
SUT MNCRL AB 4-0 PS2 18 (SUTURE) ×2 IMPLANT
SUT VIC AB 3-0 SH 27XBRD (SUTURE) ×2 IMPLANT
TOWEL GREEN STERILE (TOWEL DISPOSABLE) ×2 IMPLANT
TOWEL GREEN STERILE FF (TOWEL DISPOSABLE) ×2 IMPLANT
TRACER MAGTRACE VIAL (MISCELLANEOUS) IMPLANT

## 2024-03-10 NOTE — Anesthesia Procedure Notes (Signed)
 Procedure Name: LMA Insertion Date/Time: 03/10/2024 7:35 AM  Performed by: Virgil Ee, CRNAPre-anesthesia Checklist: Patient identified, Patient being monitored, Timeout performed, Emergency Drugs available and Suction available Patient Re-evaluated:Patient Re-evaluated prior to induction Oxygen Delivery Method: Circle system utilized Preoxygenation: Pre-oxygenation with 100% oxygen Induction Type: IV induction Ventilation: Mask ventilation without difficulty LMA: LMA inserted LMA Size: 3.0 Tube type: Oral Number of attempts: 1 Placement Confirmation: positive ETCO2 and breath sounds checked- equal and bilateral Tube secured with: Tape Dental Injury: Teeth and Oropharynx as per pre-operative assessment

## 2024-03-10 NOTE — Anesthesia Postprocedure Evaluation (Signed)
 Anesthesia Post Note  Patient: Leah Gould  Procedure(s) Performed: BIOPSY, LYMPH NODE, SENTINEL, AXILLARY (Right)     Patient location during evaluation: PACU Anesthesia Type: General Level of consciousness: awake and alert Pain management: pain level controlled Vital Signs Assessment: post-procedure vital signs reviewed and stable Respiratory status: spontaneous breathing, nonlabored ventilation, respiratory function stable and patient connected to nasal cannula oxygen Cardiovascular status: blood pressure returned to baseline and stable Postop Assessment: no apparent nausea or vomiting Anesthetic complications: no   No notable events documented.  Last Vitals:  Vitals:   03/10/24 0945 03/10/24 0952  BP: 118/75 118/76  Pulse: 79 78  Resp: 16 14  Temp:  36.7 C  SpO2: 100% 100%    Last Pain:  Vitals:   03/10/24 0945  TempSrc:   PainSc: 0-No pain                 Thom JONELLE Peoples

## 2024-03-10 NOTE — Interval H&P Note (Signed)
 History and Physical Interval Note: no change in H and P  03/10/2024 7:44 AM  Leah Gould  has presented today for surgery, with the diagnosis of HISTORY OF LYMPHOMA, BILATERAL AXILLARY LYMPHADENOPATHY.  The various methods of treatment have been discussed with the patient and family. After consideration of risks, benefits and other options for treatment, the patient has consented to  Procedure(s) with comments: BIOPSY, LYMPH NODE, SENTINEL, AXILLARY (Right) - LMA EXCISIONAL BIOPSY RIGHT AXILLARY LYMPH NODE as a surgical intervention.  The patient's history has been reviewed, patient examined, no change in status, stable for surgery.  I have reviewed the patient's chart and labs.  Questions were answered to the patient's satisfaction.     Leah Gould

## 2024-03-10 NOTE — Op Note (Signed)
   Leah Gould 03/10/2024   Pre-op Diagnosis: HISTORY OF LYMPHOMA, BILATERAL AXILLARY LYMPHADENOPATHY     Post-op Diagnosis: same  Procedure(s): EXCISIONAL BIOPSY DEEP RIGHT AXILLARY LYMPH NODES  Surgeon(s): Vernetta Berg, MD  Anesthesia: General  Staff:  Circulator: Gerome Verla SAILOR, RN; Deette Thedora LABOR, RN Scrub Person: Ivonne Oka  Estimated Blood Loss: Minimal               Specimens: SENT TO PATHOLOGY  Indications: This is a 28 year old female who has previously been treated for Hodgkin's lymphoma who now has increasing adenopathy in her axilla.  She had a needle biopsy of the lymph node in the left axilla which showed only follicular hyperplasia with no evidence of malignancy.  Because of increase in size and the adenopathy, excisional biopsy of lymph nodes in the axilla was recommended for further histologic evaluation to rule out recurrent lymphoma  Procedure: The patient was brought to the operating room and identified as the correct patient.  She was placed on the operating table and general anesthesia was induced.  Her right axilla was prepped and draped in the usual sterile fashion.  I anesthetized skin in the right axilla with Marcaine .  I then made a longitudinal incision with a scalpel.  I then dissected down into the deep right axillary tissue toward the palpable lymph nodes.  There were multiple enlarged lymph nodes.  I was able to easily elevate 2 that were grouped together and slowly dissect them out with the cautery as well as surgical clips.  I then completely remove the 2 lymph nodes and sent them to pathology fresh for evaluation.  Hemostasis appeared to be achieved.  I injected the incision further with Marcaine .  I then closed the subcutaneous tissue with interrupted 3-0 Vicryl sutures and closed the skin with a running 4-0 Monocryl.  Dermabond was then applied.  The patient tolerated the procedure well.  All the counts were correct at the end of  the procedure.  The patient was then extubated in the operating room and taken in a stable condition to the recovery room.          Berg Vernetta   Date: 03/10/2024  Time: 9:06 AM

## 2024-03-10 NOTE — Transfer of Care (Signed)
 Immediate Anesthesia Transfer of Care Note  Patient: Leah Gould  Procedure(s) Performed: BIOPSY, LYMPH NODE, SENTINEL, AXILLARY (Right)  Patient Location: PACU  Anesthesia Type:General  Level of Consciousness: drowsy and responds to stimulation  Airway & Oxygen Therapy: Patient Spontanous Breathing  Post-op Assessment: Report given to RN and Post -op Vital signs reviewed and stable  Post vital signs: Reviewed and stable  Last Vitals:  Vitals Value Taken Time  BP 120/76 03/10/24 09:18  Temp 36.6 C 03/10/24 09:18  Pulse 79 03/10/24 09:21  Resp 12 03/10/24 09:21  SpO2 100 % 03/10/24 09:21  Vitals shown include unfiled device data.  Last Pain:  Vitals:   03/10/24 0710  TempSrc:   PainSc: 0-No pain      Patients Stated Pain Goal: 0 (03/10/24 0650)  Complications: No notable events documented.

## 2024-03-10 NOTE — Discharge Instructions (Signed)
 You may shower starting tomorrow  Ice pack, Tylenol , and ibuprofen  also for pain  No vigorous activity for 1 week  We will call you as soon as we know the results of the pathology  For any problems, please call [phone]

## 2024-03-11 ENCOUNTER — Encounter (HOSPITAL_COMMUNITY): Payer: Self-pay | Admitting: Surgery

## 2024-03-15 LAB — SURGICAL PATHOLOGY

## 2024-03-15 NOTE — Progress Notes (Signed)
 This encounter was created in error - please disregard.

## 2024-04-12 ENCOUNTER — Other Ambulatory Visit: Payer: Self-pay

## 2024-04-12 DIAGNOSIS — C8108 Nodular lymphocyte predominant Hodgkin lymphoma, lymph nodes of multiple sites: Secondary | ICD-10-CM

## 2024-04-12 DIAGNOSIS — D500 Iron deficiency anemia secondary to blood loss (chronic): Secondary | ICD-10-CM

## 2024-04-13 ENCOUNTER — Inpatient Hospital Stay

## 2024-04-13 ENCOUNTER — Inpatient Hospital Stay: Attending: Internal Medicine | Admitting: Hematology

## 2024-05-20 ENCOUNTER — Ambulatory Visit: Admitting: Dermatology

## 2024-07-05 ENCOUNTER — Ambulatory Visit: Admitting: Dermatology
